# Patient Record
Sex: Male | Born: 1940
Health system: Southern US, Community
[De-identification: ages and names within clinical notes are randomized; demographics above are authoritative.]

## PROBLEM LIST (undated history)

## (undated) DIAGNOSIS — I10 Essential (primary) hypertension: Secondary | ICD-10-CM

## (undated) DIAGNOSIS — E785 Hyperlipidemia, unspecified: Secondary | ICD-10-CM

## (undated) DIAGNOSIS — D649 Anemia, unspecified: Secondary | ICD-10-CM

## (undated) DIAGNOSIS — Z9119 Patient's noncompliance with other medical treatment and regimen: Secondary | ICD-10-CM

## (undated) DIAGNOSIS — M1990 Unspecified osteoarthritis, unspecified site: Secondary | ICD-10-CM

## (undated) DIAGNOSIS — E669 Obesity, unspecified: Secondary | ICD-10-CM

## (undated) DIAGNOSIS — Z91199 Patient's noncompliance with other medical treatment and regimen due to unspecified reason: Secondary | ICD-10-CM

## (undated) DIAGNOSIS — N529 Male erectile dysfunction, unspecified: Secondary | ICD-10-CM

## (undated) DIAGNOSIS — M25559 Pain in unspecified hip: Secondary | ICD-10-CM

## (undated) DIAGNOSIS — R7301 Impaired fasting glucose: Secondary | ICD-10-CM

## (undated) DIAGNOSIS — T7840XA Allergy, unspecified, initial encounter: Secondary | ICD-10-CM

## (undated) DIAGNOSIS — G8929 Other chronic pain: Secondary | ICD-10-CM

## (undated) DIAGNOSIS — K219 Gastro-esophageal reflux disease without esophagitis: Secondary | ICD-10-CM

## (undated) HISTORY — DX: Allergy, unspecified, initial encounter: T78.40XA

## (undated) HISTORY — PX: BILATERAL KNEE ARTHROSCOPY: SUR91

## (undated) HISTORY — DX: Male erectile dysfunction, unspecified: N52.9

## (undated) HISTORY — DX: Anemia, unspecified: D64.9

## (undated) HISTORY — DX: Impaired fasting glucose: R73.01

## (undated) HISTORY — DX: Obesity, unspecified: E66.9

## (undated) HISTORY — PX: HERNIA REPAIR: SHX51

## (undated) HISTORY — PX: OTHER SURGICAL HISTORY: SHX169

## (undated) HISTORY — PX: REFRACTIVE SURGERY: SHX103

## (undated) HISTORY — DX: Essential (primary) hypertension: I10

## (undated) HISTORY — DX: Gastro-esophageal reflux disease without esophagitis: K21.9

## (undated) HISTORY — DX: Patient's noncompliance with other medical treatment and regimen due to unspecified reason: Z91.199

## (undated) HISTORY — DX: Hyperlipidemia, unspecified: E78.5

## (undated) HISTORY — PX: JOINT REPLACEMENT: SHX530

## (undated) HISTORY — DX: Other chronic pain: G89.29

## (undated) HISTORY — DX: Unspecified osteoarthritis, unspecified site: M19.90

## (undated) HISTORY — PX: EYE SURGERY: SHX253

## (undated) HISTORY — DX: Pain in unspecified hip: M25.559

## (undated) HISTORY — DX: Patient's noncompliance with other medical treatment and regimen: Z91.19

---

## 2004-04-18 ENCOUNTER — Ambulatory Visit (HOSPITAL_COMMUNITY): Admission: RE | Admit: 2004-04-18 | Discharge: 2004-04-18 | Payer: Self-pay | Admitting: Family Medicine

## 2004-09-20 ENCOUNTER — Ambulatory Visit: Payer: Self-pay | Admitting: Internal Medicine

## 2004-09-20 ENCOUNTER — Ambulatory Visit (HOSPITAL_COMMUNITY): Admission: RE | Admit: 2004-09-20 | Discharge: 2004-09-20 | Payer: Self-pay | Admitting: Internal Medicine

## 2005-02-19 ENCOUNTER — Ambulatory Visit (HOSPITAL_COMMUNITY): Admission: RE | Admit: 2005-02-19 | Discharge: 2005-02-19 | Payer: Self-pay | Admitting: Family Medicine

## 2005-02-19 ENCOUNTER — Ambulatory Visit: Payer: Self-pay | Admitting: *Deleted

## 2005-06-25 ENCOUNTER — Encounter (INDEPENDENT_AMBULATORY_CARE_PROVIDER_SITE_OTHER): Payer: Self-pay | Admitting: Orthopaedic Surgery

## 2005-06-25 ENCOUNTER — Ambulatory Visit (HOSPITAL_COMMUNITY): Admission: RE | Admit: 2005-06-25 | Discharge: 2005-06-25 | Payer: Self-pay | Admitting: Orthopaedic Surgery

## 2007-03-04 ENCOUNTER — Inpatient Hospital Stay (HOSPITAL_COMMUNITY): Admission: AC | Admit: 2007-03-04 | Discharge: 2007-03-10 | Payer: Self-pay

## 2007-03-08 ENCOUNTER — Ambulatory Visit: Payer: Self-pay | Admitting: Physical Medicine & Rehabilitation

## 2007-03-10 ENCOUNTER — Inpatient Hospital Stay (HOSPITAL_COMMUNITY)
Admission: RE | Admit: 2007-03-10 | Discharge: 2007-03-17 | Payer: Self-pay | Admitting: Physical Medicine & Rehabilitation

## 2007-03-12 ENCOUNTER — Ambulatory Visit: Payer: Self-pay | Admitting: Physical Medicine & Rehabilitation

## 2007-09-23 HISTORY — PX: HIP FRACTURE SURGERY: SHX118

## 2007-10-29 ENCOUNTER — Ambulatory Visit (HOSPITAL_COMMUNITY): Admission: RE | Admit: 2007-10-29 | Discharge: 2007-10-29 | Payer: Self-pay | Admitting: Family Medicine

## 2008-03-29 IMAGING — US US AORTA SCREENING (MEDICARE)
1 series · 14 of 25 positions shown · non-contrast
Comparison: none

HISTORY: Screening for abdominal aortic aneurysm

[Series 1: us aorta screening (medicare) · 0.33mm/px · 14 of 34 slices shown]
[im 1/34]
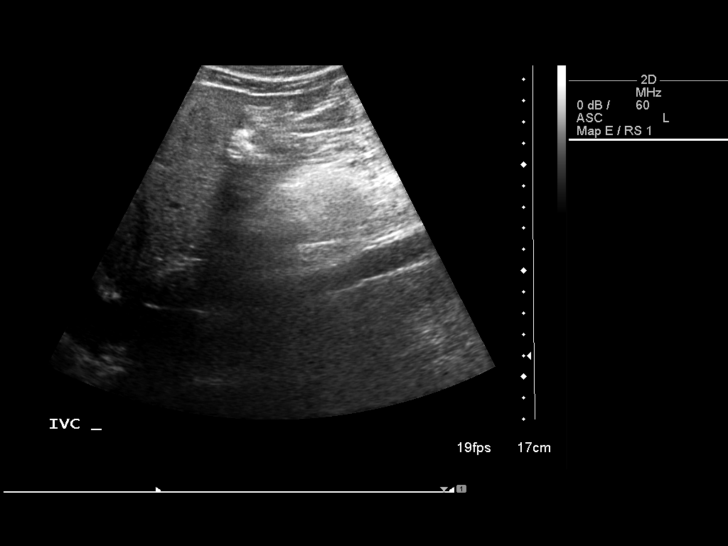
[im 3/34]
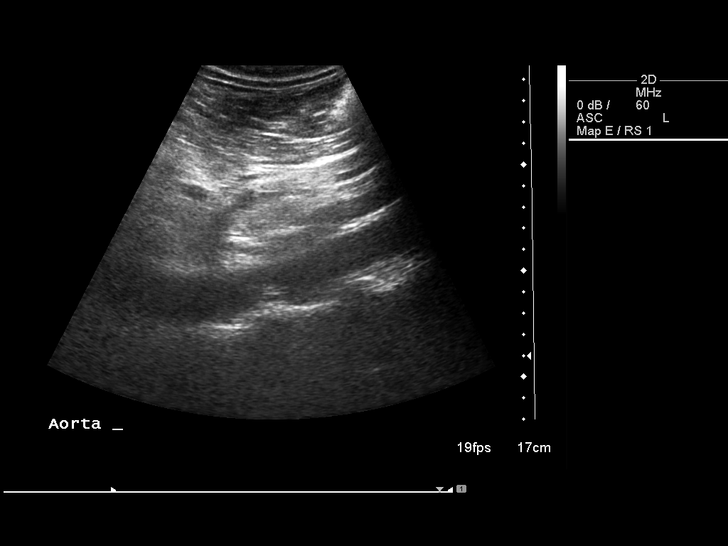
[im 6/34]
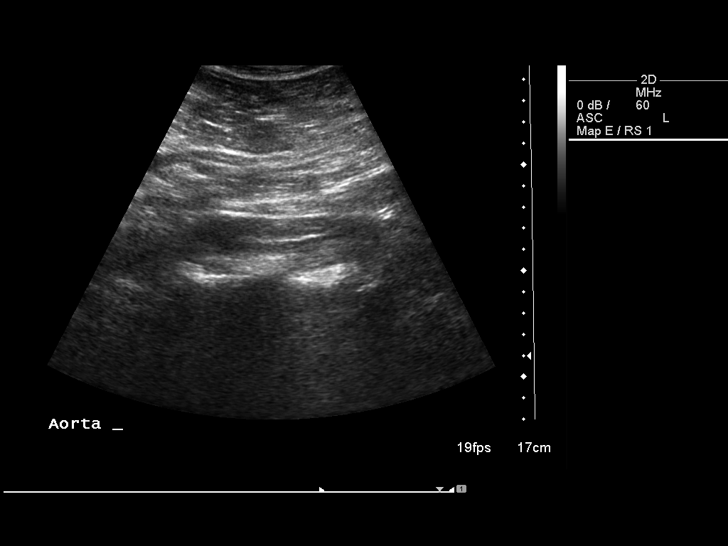
[im 9/34]
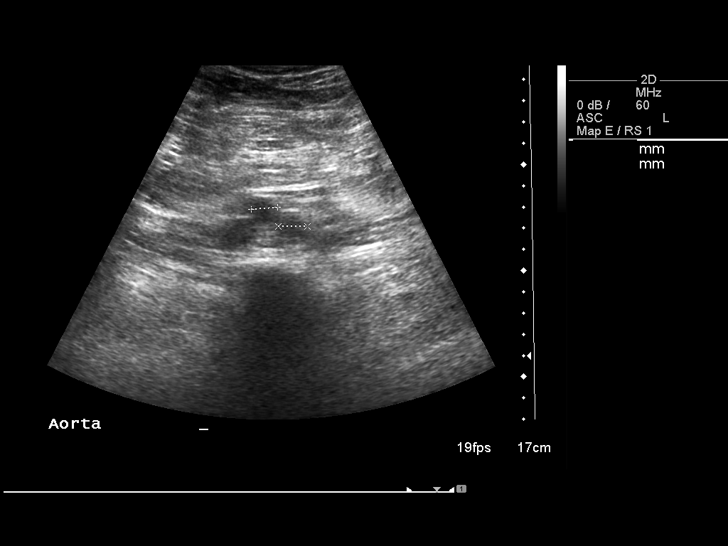
[im 12/34]
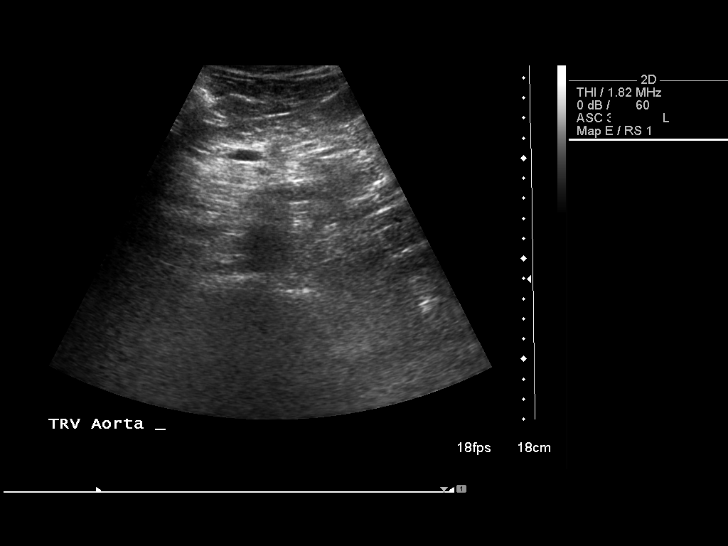
[im 13/34]
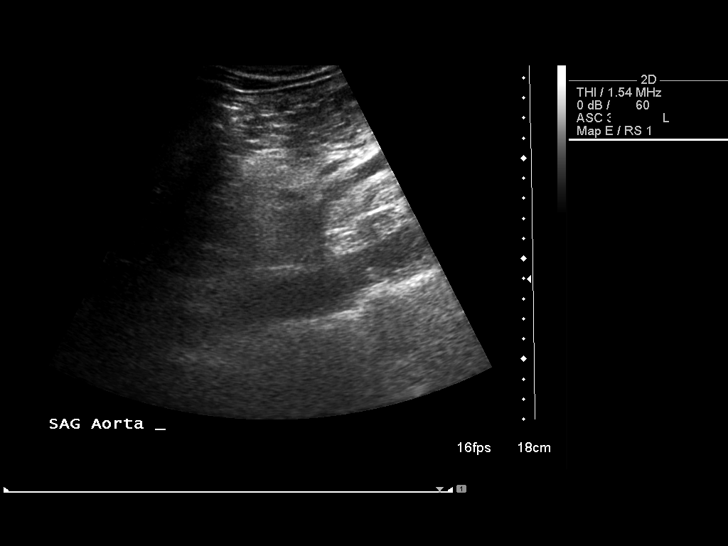
[im 16/34]
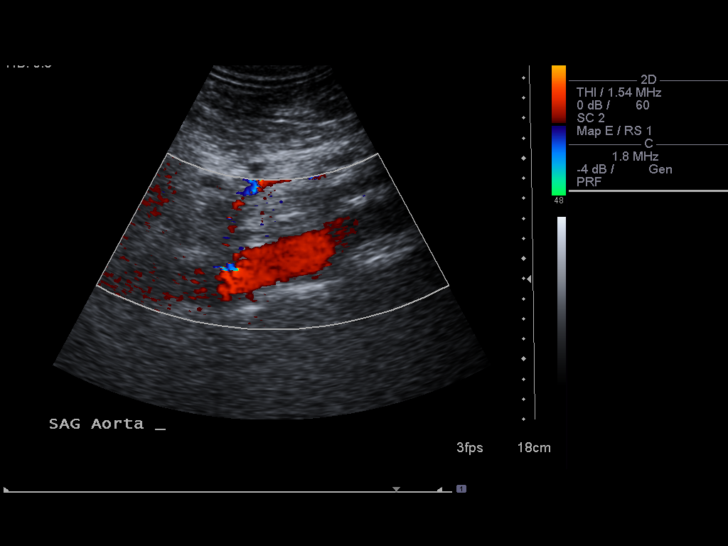
[im 18/34]
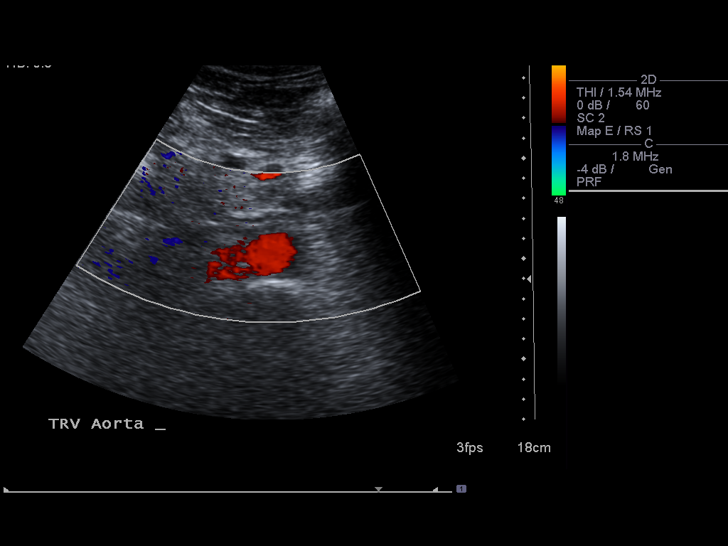
[im 21/34]
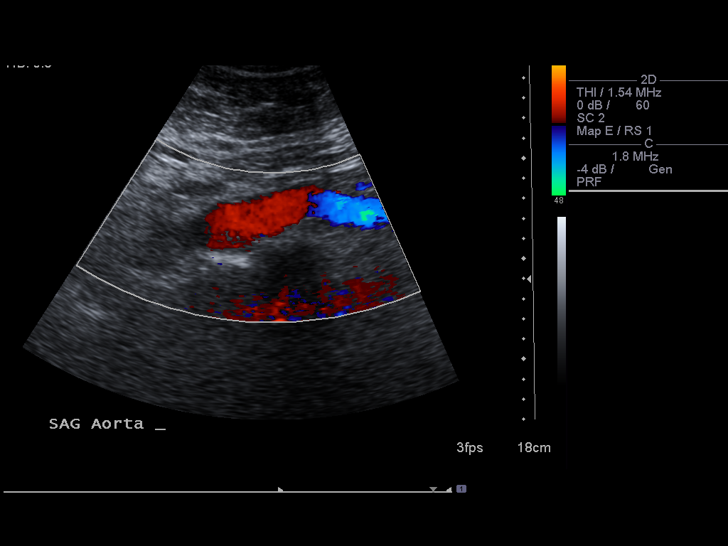
[im 23/34]
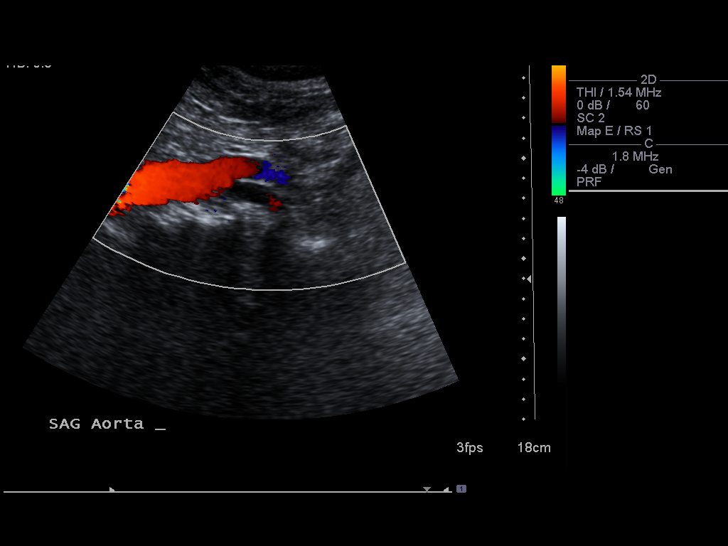
[im 25/34]
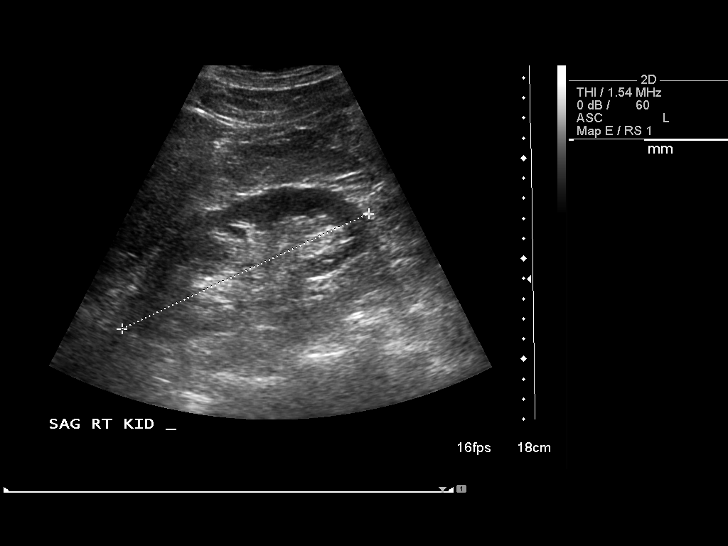
[im 28/34]
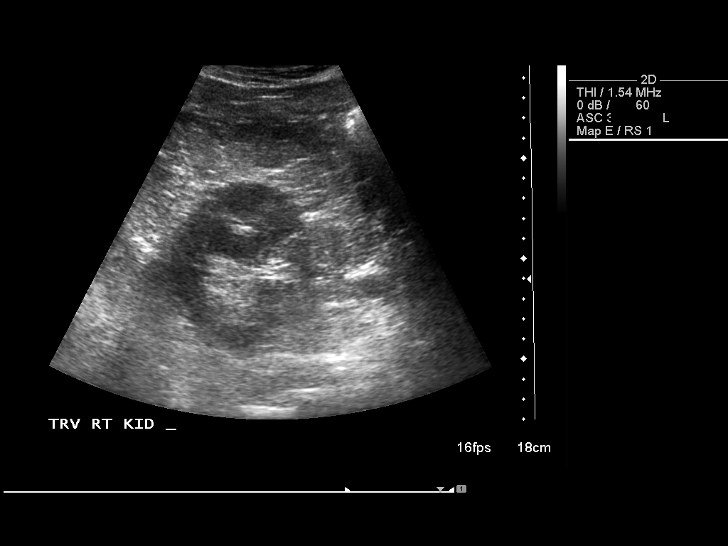
[im 31/34]
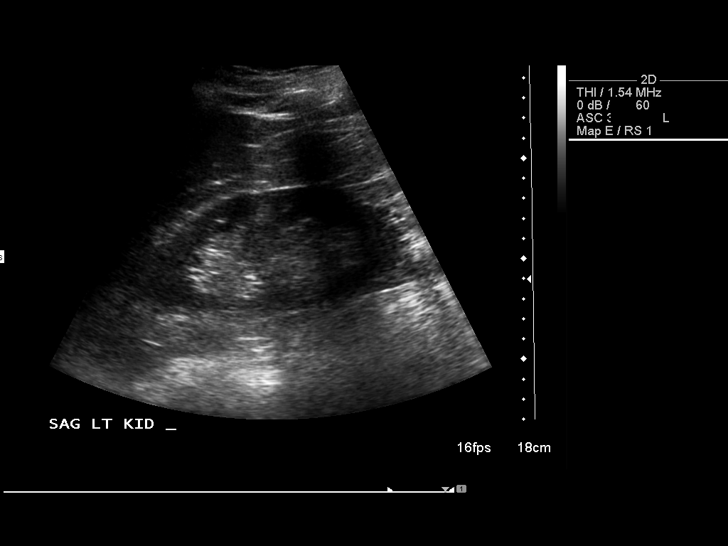
[im 34/34]
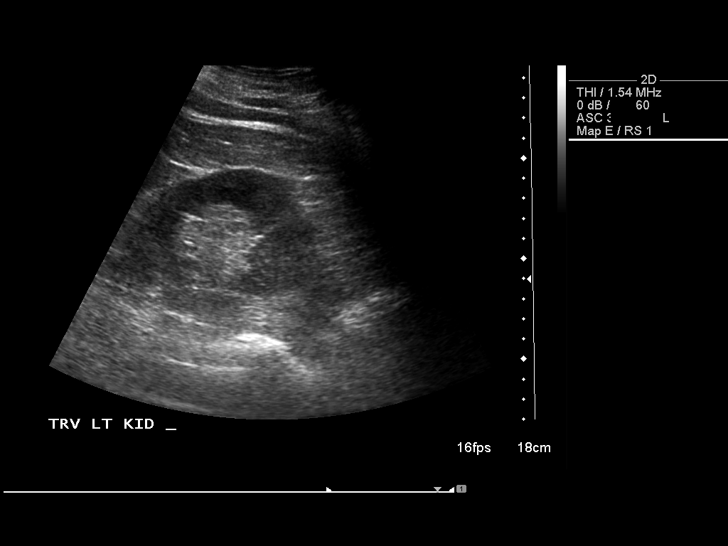

[14 of 25 positions shown; findings below may reference images not displayed]

ULTRASOUND AORTA SCREENING:

Sonography of the abdominal aorta performed.
Survey imaging of the kidneys performed.

Mild atherosclerotic changes in the abdominal aorta identified with aortic
maximal dimensions of 2.8 cm diameter proximally, 2.8 cm diameter at midportion,
and 2.9 cm distally.
No discrete aneurysmal dilatation.
Common iliac arteries minimally prominent at 12 mm diameter right and 14 mm
diameter left.
Kidneys normal in size on survey imaging, 13.6 cm length right and 13.9 cm left.
IMPRESSION: Atherosclerotic changes of abdominal aorta with upper normal caliber distally,
measuring up to 2.9 cm greatest size.
Minimally prominent common iliac artery diameters bilaterally.

## 2008-09-22 HISTORY — PX: CHOLECYSTECTOMY: SHX55

## 2009-10-15 ENCOUNTER — Inpatient Hospital Stay (HOSPITAL_COMMUNITY): Admission: EM | Admit: 2009-10-15 | Discharge: 2009-10-15 | Payer: Self-pay | Admitting: Emergency Medicine

## 2009-10-15 ENCOUNTER — Encounter (INDEPENDENT_AMBULATORY_CARE_PROVIDER_SITE_OTHER): Payer: Self-pay | Admitting: General Surgery

## 2010-10-13 ENCOUNTER — Encounter: Payer: Self-pay | Admitting: Family Medicine

## 2010-12-08 LAB — URINALYSIS, ROUTINE W REFLEX MICROSCOPIC
Bilirubin Urine: NEGATIVE
Glucose, UA: NEGATIVE mg/dL
Protein, ur: NEGATIVE mg/dL
Specific Gravity, Urine: 1.015 (ref 1.005–1.030)
Urobilinogen, UA: 0.2 mg/dL (ref 0.0–1.0)

## 2010-12-08 LAB — COMPREHENSIVE METABOLIC PANEL
ALT: 19 U/L (ref 0–53)
Alkaline Phosphatase: 45 U/L (ref 39–117)
BUN: 13 mg/dL (ref 6–23)
CO2: 26 mEq/L (ref 19–32)
Chloride: 96 mEq/L (ref 96–112)
Glucose, Bld: 125 mg/dL — ABNORMAL HIGH (ref 70–99)
Potassium: 3.6 mEq/L (ref 3.5–5.1)
Total Bilirubin: 0.6 mg/dL (ref 0.3–1.2)

## 2010-12-08 LAB — PROTIME-INR
INR: 1.05 (ref 0.00–1.49)
Prothrombin Time: 13.6 seconds (ref 11.6–15.2)

## 2010-12-08 LAB — DIFFERENTIAL
Basophils Absolute: 0 10*3/uL (ref 0.0–0.1)
Basophils Relative: 1 % (ref 0–1)
Eosinophils Absolute: 0.3 10*3/uL (ref 0.0–0.7)
Neutro Abs: 5.1 10*3/uL (ref 1.7–7.7)
Neutrophils Relative %: 63 % (ref 43–77)

## 2010-12-08 LAB — POCT CARDIAC MARKERS: Myoglobin, poc: 44.9 ng/mL (ref 12–200)

## 2010-12-08 LAB — CBC
HCT: 47.1 % (ref 39.0–52.0)
Hemoglobin: 15.9 g/dL (ref 13.0–17.0)
RBC: 5.25 MIL/uL (ref 4.22–5.81)
WBC: 8.1 10*3/uL (ref 4.0–10.5)

## 2010-12-08 LAB — URINE MICROSCOPIC-ADD ON

## 2010-12-08 LAB — AMYLASE: Amylase: 44 U/L (ref 0–105)
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
## 2011-02-07 NOTE — Op Note (Signed)
NAME:  Hector [name], Hector [name]              ACCOUNT NO.:  [id number]   MEDICAL RECORD NO.:  [id number]          PATIENT TYPE:  AMB   LOCATION:  DAY                           FACILITY:  [facility]   PHYSICIAN:  J. Darreld Mclean, M.D. DATE OF BIRTH:  28-May-1941   DATE OF PROCEDURE:  06/25/2005  DATE OF DISCHARGE:                                 OPERATIVE REPORT   PREOPERATIVE DIAGNOSIS:  Tear medial meniscus, left knee.   POSTOPERATIVE DIAGNOSIS:  Tear medial meniscus, left knee, plus partial tear  of lateral meniscus and loose body.   PROCEDURE:  Operative arthroscopy of the left knee, partial medial and  lateral meniscectomy, removal of loose body.   TOURNIQUET TIME:  27 minutes.   DRAINS:  No drains.   SURGEON:  Dr. Hilda Lias.   INDICATIONS:  The patient is a 70 year old male with pain and tenderness in  his left knee. MRI shows a tear of the left knee medial meniscus. It has not  improved with conservative treatments. Surgery is recommended. Risks and  imponderables of the procedure were discussed preoperatively. The patient  appeared to understand and agreed with the procedure.   OPERATIVE FINDINGS:  Suprapatellar pouch had some mild degenerative changes.  The undersurface of the patella had some grade 3 to 4 changes, more  laterally. There is a loose body around the patella area which was removed.  Medially, there was fraying and tearing of the meniscus more anteriorly and  some in the far posterior horn. Had a large defect on the femoral condyle, a  grade 3 to early 4 changes. Anterior cruciate had some slight fraying but  otherwise intact. Medically, there was diffuse fraying of the lateral  meniscal tissue and posterior horn but no significant tear except for one  area. Cartilage looked grade 2 changes laterally.   DESCRIPTION OF PROCEDURE:  The patient was seen in the holding area. The  left knee was identified as the correct surgical site. We identified the  patient. He placed a  mark on the left knee, and I placed a mark on the left  knee. I talked to his wife. The patient was brought back to the operating  room. He was placed supine and given general anesthetic. Leg holder placed  deflated left upper thigh and was placed into the leg holder. The patient  prepped and draped in the usual manner. Had a time out and reidentified the  patient and doing the left knee. The leg was elevated and wrapped  circumferentially with Esmarch bandage. Tourniquet inflated to 350 mmHg.  Esmarch bandage removed. Inflow cannula inserted medially. Lactated ringers  instilled in the knee by an infusion pump. Arthroscope was inserted  laterally. Knee systematically examined. Please see findings above. Using a  meniscal shaver and a laser and a punch on the tear of the meniscus  anteriorly and then the posterior horn tear was removed. Good smooth contour  was obtained. Laterally, he had significant fraying, and I used a meniscal  shaver in the anterior portion and the posterior portion with a small tear  used a laser, and I smoothed  that over very nicely. He had a loose body up  around the patellar area, and this was found and removed. Knee was  systematically reexamined, and no new pathology was found. Permanent  pictures had been taken during the procedure. Wounds were then  reapproximated using 3-0 nylon interrupted vertical mattress manner.  Marcaine 0.25% instilled in each portal. Tourniquet deflated after 27  minutes. Sterile dressing applied and bulky dressing applied. The patient  tolerated the procedure well and will go to recovery in good condition.  Prescription for Vicodin ES given for pain. If he has any difficulties or  problems, he is to call me or contact through the office or the hospital  beeper system.           ______________________________  Shela Commons. Darreld Mclean, M.D.     JWK/MEDQ  D:  06/25/2005  T:  06/25/2005  Job:  [id number]

## 2011-02-07 NOTE — H&P (Signed)
NAME:  Hector [name], Hector [name]              ACCOUNT NO.:  [id number]   MEDICAL RECORD NO.:  [id number]          PATIENT TYPE:  AMB   LOCATION:  DAY                           FACILITY:  [facility]   PHYSICIAN:  J. Darreld Mclean, M.D. DATE OF BIRTH:  Apr 19, 1941   DATE OF ADMISSION:  06/25/2005  DATE OF DISCHARGE:  LH                                HISTORY & PHYSICAL   CHIEF COMPLAINT:  My knee hurts on the left.   HISTORY OF PRESENT ILLNESS:  The patient complains of pain and tenderness in  the left knee for several months.  It has been getting progressively worse.  I saw him in the office on May 13, 2005.  At that time, I felt he may  have a medial meniscal injury.  I injected his knee with Depo-Medrol 40 mg.  I recommended an MRI.  He got an MRI which showed some degenerative fraying  of the lateral meniscus, complex medial meniscus tear, with tricompartmental  osteoarthritis.  I informed him of the findings on May 20, 2005.  He says  the injection helped, and he just did not want any surgery at that time.  However, the pain has reoccurred.  He is having giving way of the knee again  and pain and tenderness.  He would like to proceed with surgery of the left  knee.  The risks and imponderables of the procedure were discussed.  He  understands that this is an outpatient procedure.   PAST MEDICAL HISTORY:  The patient has a history of hypertension, but  otherwise has a negative past history.   ALLERGIES:  THE PATIENT HAS NO ALLERGIES.   CURRENT MEDICATIONS:  1.  Lotrel 20 mg daily.  2.  Tricor daily.   SOCIAL HISTORY:  He does not smoke.  He does not use alcoholic beverages.  He is a Engineer, agricultural.  He is divorced.  He lives in [place].   PRIMARY CARE PHYSICIAN:  Donna Bernard, M.D.   PAST SURGICAL HISTORY:  1.  He is status post left knee surgery, meniscectomy, in 1968; right knee      surgery in 1974.  2.  Hernia repair in 2000.   FAMILY HISTORY:  He denies any  diseases that run in the family.   PHYSICAL EXAMINATION:  VITAL SIGNS:  Blood pressure 180/98, pulse 64,  respirations 20, afebrile, height 5 feet 11 inches, weight 250 pounds.  GENERAL:  He is alert, cooperative, and oriented.  HEENT:  Negative.  NECK:  Supple.  LUNGS:  Clear to P&A.  HEART:  Regular without murmur heard.  ABDOMEN:  Soft, nontender, without masses.  EXTREMITIES:  Left knee has swelling and tenderness.  Positive McMurray  medially.  Other extremities are negative.  CNS:  Intact.  SKIN:  Intact.   IMPRESSION:  1.  Tear of the medial meniscus, left knee; degenerative joint disease of      the left knee.  2.  History of hypertension.   PLAN:  Operative arthroscopy of the left knee as an outpatient.  Discussed  the procedure, plan, and the risks  and imponderables.  He appears to  understand.  Labs are pending.                                            ______________________________  J. Darreld Mclean, M.D.     JWK/MEDQ  D:  06/24/2005  T:  06/24/2005  Job:  [id number]

## 2011-02-07 NOTE — Procedures (Signed)
NAMEMICKEAL, Hector [name]              ACCOUNT NO.:  [id number]   MEDICAL RECORD NO.:  [id number]          PATIENT TYPE:  OUT   LOCATION:  RAD                           FACILITY:  [facility]   PHYSICIAN:  Vida Roller, M.D.   DATE OF BIRTH:  07/09/1941   DATE OF PROCEDURE:  02/19/2005  DATE OF DISCHARGE:                                  ECHOCARDIOGRAM   PRIMARY:  Dr. Lubertha South.   Tape number [id number]. Tape count 1130 through 1571.   This is a 70 year old male with an abnormal electrocardiogram. Technical  quality of this study is somewhat limited.   M-MODE TRACINGS:  The aorta is 33 mm.   Left atrium is 43 mm.   The septum is 17 mm.   Posterior wall is 15 mm.   Left ventricular diastolic dimension is 38 mm.   Left ventricular systolic dimension is 22 mm.   TWO-D AND DOPPLER IMAGING:  The left ventricle is normal with vigorous left  ventricular systolic function. Estimated ejection fraction is greater than  75%. There were no obvious wall motion abnormalities. Specifically, the  inferior wall appears to be completely normal. There is mild to moderate  concentric left ventricular hypertrophy.   The right ventricle appears to be top normal in size. There is mild  thickening of the ventricular free wall consistent with right ventricular  hypertrophy.   Both atria are enlarged.   The aortic valve is not well seen, but it does not appear to have any  significant stenosis or regurgitation.   The mitral valve was also not particularly well seen. There is no  significant stenosis or regurgitation.   Tricuspid valve not well seen.   Pulmonic valve not well seen.   Ascending aorta not well seen.   Inferior vena cava not well seen.   There is no obvious pericardial effusion.      JH/MEDQ  D:  02/19/2005  T:  02/19/2005  Job:  [id number]
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
## 2011-02-07 NOTE — Op Note (Signed)
NAME:  Hector [name], Hector [name]              ACCOUNT NO.:  [id number]   MEDICAL RECORD NO.:  [id number]          PATIENT TYPE:  AMB   LOCATION:  DAY                           FACILITY:  [facility]   PHYSICIAN:  R. Roetta Sessions, M.D. DATE OF BIRTH:  [birth year]   DATE OF PROCEDURE:  09/20/2004  DATE OF DISCHARGE:                                 OPERATIVE REPORT   PROCEDURE:  Screening colonoscopy.   ENDOSCOPIST:  Gerrit Friends. Rourk, M.D.   INDICATIONS FOR PROCEDURE:  The patient is a 70 year old gentleman who was  referred at the courtesy of Dr. Lacretia Nicks. Simone Curia for colorectal cancer  screening.  He is devoid of any lower GI tract symptoms.  Sigmoidoscopy in  2001 demonstrated only left-sided diverticula.  There is no family history  of colorectal neoplasia.  Colonoscopy is now.  This approach has been  discussed with the patient at length.  The potential risks, benefits, and  alternatives have been reviewed; questions answered.  Please see the  documentation in the medical record.   PROCEDURE NOTE:  O2 saturation, blood pressure, pulse and respirations were  monitored throughout the entire procedure.  Conscious sedation: Versed 4 mg IV, Demerol 75 mg IV in divided doses.   INSTRUMENT:  Olympus video chip system.   FINDINGS:  A digital rectal exam revealed no abnormalities.   ENDOSCOPIC FINDINGS:  The prep was good.   RECTUM:  Examination of the rectal mucosa including the retroflex view of  the anal verge revealed a single prominent anal papilla.  Otherwise rectal  mucosa appeared normal.   COLON:  The colonic mucosa was surveyed from the rectosigmoid junction  through the left transverse and right colon to the area of the appendiceal  orifice, ileocecal valve, and cecum.  These structures were well seen and  photographed for the record.   From this level the scope was slowly and cautiously withdrawn.  All  previously mentioned mucosal surfaces were again seen.  The patient was  noted to  have numerous sigmoid diverticula.  The remainder of the colonic  mucosa appeared normal.  The patient tolerated the procedure well and was  reacted in endoscopy.   IMPRESSION:  1.  A single prominent anal papilla otherwise normal rectum.  2.  Left-sided diverticula.  3.  The remainder of the colonic mucosa appeared normal.   RECOMMENDATIONS:  1.  Diverticulosis literature provided to the patient.  2.  Repeat screening colonoscopy in 10 years.     Otelia Sergeant   RMR/MEDQ  D:  09/20/2004  T:  09/20/2004  Job:  [id number]   cc:   Donna Bernard, M.D.  [address]  [place]  [place] [address]  Fax: [phone]   R. Roetta Sessions, M.D.  Fax: [phone]

## 2011-07-09 LAB — CBC
HCT: 34.2 — ABNORMAL LOW
Hemoglobin: 11.7 — ABNORMAL LOW
MCHC: 33.6
MCHC: 34.4
MCV: 88.9
MCV: 88.6
Platelets: 115 — ABNORMAL LOW
Platelets: 139 — ABNORMAL LOW
Platelets: 277
RBC: 3.41 — ABNORMAL LOW
RDW: 12.7
RDW: 13.2
RDW: 13.3

## 2011-07-09 LAB — DIFFERENTIAL
Basophils Absolute: 0.1
Basophils Relative: 1
Lymphocytes Relative: 18
Monocytes Relative: 14 — ABNORMAL HIGH
Neutro Abs: 5.2
Neutrophils Relative %: 63

## 2011-07-09 LAB — PROTIME-INR
INR: 1.8 — ABNORMAL HIGH
INR: 2 — ABNORMAL HIGH
INR: 2.2 — ABNORMAL HIGH
INR: 2.5 — ABNORMAL HIGH
INR: 2.6 — ABNORMAL HIGH
Prothrombin Time: 22.1 — ABNORMAL HIGH
Prothrombin Time: 24 — ABNORMAL HIGH
Prothrombin Time: 25.3 — ABNORMAL HIGH
Prothrombin Time: 29.2 — ABNORMAL HIGH

## 2011-07-09 LAB — COMPREHENSIVE METABOLIC PANEL
Alkaline Phosphatase: 40
BUN: 14
Creatinine, Ser: 1.03
Glucose, Bld: 99
Potassium: 3.9
Total Bilirubin: 0.6
Total Protein: 5.7 — ABNORMAL LOW

## 2011-07-09 LAB — BASIC METABOLIC PANEL
BUN: 16
CO2: 27
Calcium: 7.9 — ABNORMAL LOW
Chloride: 100
Creatinine, Ser: 1.32
GFR calc Af Amer: >60

## 2011-07-10 LAB — SAMPLE TO BLOOD BANK

## 2011-07-10 LAB — POCT I-STAT 7, (LYTES, BLD GAS, ICA,H+H)
Calcium, Ion: 1.15
HCT: 36 — ABNORMAL LOW
Hemoglobin: 10.5 — ABNORMAL LOW
Hemoglobin: 12.2 — ABNORMAL LOW
O2 Saturation: 93
Operator id: 119851
Potassium: 3.8
Potassium: 4.2
Potassium: 4.2
Sodium: 137
TCO2: 23
TCO2: 26
pCO2 arterial: 46.9 — ABNORMAL HIGH
pCO2 arterial: 58.9
pH, Arterial: 7.302 — ABNORMAL LOW
pH, Arterial: 7.334 — ABNORMAL LOW
pO2, Arterial: 81

## 2011-07-10 LAB — BASIC METABOLIC PANEL
BUN: 14
CO2: 28
Calcium: 7.7 — ABNORMAL LOW
Calcium: 8.8
Creatinine, Ser: 1.21
Creatinine, Ser: 1.28
GFR calc Af Amer: >60
GFR calc Af Amer: >60
GFR calc non Af Amer: >60

## 2011-07-10 LAB — POCT I-STAT 4, (NA,K, GLUC, HGB,HCT)
Glucose, Bld: 113 — ABNORMAL HIGH
HCT: 27 — ABNORMAL LOW
Operator id: 114421
Operator id: 119851
Potassium: 3.9
Sodium: 137

## 2011-07-10 LAB — I-STAT 8, (EC8 V) (CONVERTED LAB)
Acid-base deficit: 1
Bicarbonate: 23
Chloride: 107
HCT: 44
Hemoglobin: 15
Operator id: 146091
Sodium: 138
pCO2, Ven: 34.9 — ABNORMAL LOW

## 2011-07-10 LAB — DIFFERENTIAL
Basophils Absolute: 0
Basophils Relative: 0
Eosinophils Absolute: 0.2
Eosinophils Relative: 2
Neutrophils Relative %: 69

## 2011-07-10 LAB — CBC
HCT: 41.8
MCHC: 34
MCV: 89.9
Platelets: 128 — ABNORMAL LOW
Platelets: 211
RBC: 4.28
RDW: 13.1
RDW: 13.2
WBC: 10.7 — ABNORMAL HIGH

## 2011-07-10 LAB — CROSSMATCH: ABO/RH(D): A POS

## 2011-07-10 LAB — ETHANOL: Alcohol, Ethyl (B): <5

## 2011-07-10 LAB — PROTIME-INR
INR: 1.1
INR: 1.4
Prothrombin Time: 14.8

## 2011-07-10 LAB — POCT I-STAT CREATININE: Creatinine, Ser: 1.4

## 2011-08-14 ENCOUNTER — Emergency Department (HOSPITAL_COMMUNITY)
Admission: EM | Admit: 2011-08-14 | Discharge: 2011-08-14 | Disposition: A | Discharge status: Home / Self Care | Payer: Medicare HMO | Attending: Emergency Medicine | Admitting: Emergency Medicine

## 2011-08-14 DIAGNOSIS — K089 Disorder of teeth and supporting structures, unspecified: Secondary | ICD-10-CM | POA: Insufficient documentation

## 2011-08-14 DIAGNOSIS — I10 Essential (primary) hypertension: Secondary | ICD-10-CM | POA: Insufficient documentation

## 2011-08-14 DIAGNOSIS — K029 Dental caries, unspecified: Secondary | ICD-10-CM | POA: Insufficient documentation

## 2011-08-14 DIAGNOSIS — K0889 Other specified disorders of teeth and supporting structures: Secondary | ICD-10-CM

## 2011-08-14 DIAGNOSIS — K0381 Cracked tooth: Secondary | ICD-10-CM | POA: Insufficient documentation

## 2011-08-14 MED ORDER — OXYCODONE-ACETAMINOPHEN 5-325 MG PO TABS
ORAL_TABLET | ORAL | Status: AC
  Filled 2011-08-14: qty 3

## 2011-08-14 MED ORDER — OXYCODONE-ACETAMINOPHEN 5-325 MG PO TABS: 1.0000 | ORAL_TABLET | ORAL | Status: AC | PRN

## 2011-08-14 NOTE — Progress Notes (Signed)
70 year old male with dental pain. He is currently on amoxicillin because of a problem with tooth #10. He is scheduled to have a root canal done in 4 days, but is having problems because of pain. Acetaminophen and ibuprofen have not been giving him relief of pain. On exam, tooth #10 appears cracked but there is no gross abscess. No obvious swelling of the gingiva. He will need analgesics for pain control until he can see his dentist as scheduled.

## 2011-08-14 NOTE — ED Provider Notes (Signed)
History     CSN: [id number] Arrival date & time: 08/14/2011  6:37 PM   First MD Initiated Contact with Patient 08/14/11 1833      Chief Complaint  Patient presents with  . Dental Pain    (Consider location/radiation/quality/duration/timing/severity/associated sxs/prior treatment) HPI Comments: Patient c/o dental pain and fracture of the upper left lateral incisor.  Currently taking amoxil for infection and has appt with dentist for Monday.  He denies fever or other symptoms  Patient is a 70 y.o. male presenting with tooth pain. The history is provided by the patient.  Dental PainThe primary symptoms include mouth pain. Primary symptoms do not include oral bleeding, oral lesions, headaches, fever, shortness of breath, sore throat or angioedema. The symptoms began 3 to 5 days ago. The symptoms are worsening. The symptoms are new. The symptoms occur constantly.  Mouth pain began 3 - 5 days ago. Mouth pain is worsening. Affected locations include: teeth.  Additional symptoms include: dental sensitivity to temperature. Additional symptoms do not include: gum swelling, gum tenderness, purulent gums, trismus, jaw pain, facial swelling, trouble swallowing, ear pain, swollen glands and fatigue. Medical issues include: periodontal disease.    History reviewed. No pertinent past medical history.  Past Surgical History  Procedure Date  . Hip fracture surgery   . Knee arthroplasty     No family history on file.  History  Substance Use Topics  . Smoking status: Never Smoker   . Smokeless tobacco: Not on file  . Alcohol Use: No      Review of Systems  Constitutional: Negative for fever, chills and fatigue.  HENT: Positive for dental problem. Negative for ear pain, congestion, sore throat, facial swelling, trouble swallowing, neck pain and neck stiffness.   Respiratory: Negative for chest tightness and shortness of breath.   Cardiovascular: Negative for chest pain and palpitations.    Gastrointestinal: Negative for nausea, vomiting and abdominal pain.  Skin: Negative for rash.  Neurological: Negative for dizziness, facial asymmetry, weakness, numbness and headaches.  Hematological: Does not bruise/bleed easily.  All other systems reviewed and are negative.    Allergies  Review of patient's allergies indicates no known allergies.  Home Medications   Current Outpatient Rx  Name Route Sig Dispense Refill  . AMOXICILLIN 500 MG PO CAPS Oral Take 500 mg by mouth 3 (three) times daily.      . ATENOLOL 25 MG PO TABS Oral Take 25 mg by mouth daily.      Marland Kitchen FISH OIL BURP-LESS 1200 MG PO CAPS Oral Take 3 capsules by mouth daily.        BP 189/91  Pulse 51  Temp(Src) 98.4 F (36.9 C) (Oral)  Resp 16  Ht 5\' 11"  (1.803 m)  Wt 245 lb (111.131 kg)  BMI 34.17 kg/m2  SpO2 98%  Physical Exam  Nursing note and vitals reviewed. Constitutional: He is oriented to person, place, and time. He appears well-developed and well-nourished. No distress.       Appears uncomfortable.  HENT:  Head: Normocephalic and atraumatic. No trismus in the jaw.  Mouth/Throat: Uvula is midline, oropharynx is clear and moist and mucous membranes are normal. He does not have dentures. No oral lesions. Dental caries present. No dental abscesses, uvula swelling or lacerations.    Neck: Normal range of motion. Neck supple.  Cardiovascular: Normal rate, regular rhythm and normal heart sounds.   Pulmonary/Chest: Effort normal and breath sounds normal. No respiratory distress. He exhibits no tenderness.  Musculoskeletal: Normal range  of motion. He exhibits no tenderness.  Lymphadenopathy:    He has no cervical adenopathy.  Neurological: He is alert and oriented to person, place, and time. No cranial nerve deficit. He exhibits normal muscle tone. Coordination normal.  Skin: Skin is warm and dry.    ED Course  Procedures (including critical care time)       MDM     7:00 PM patient has a  fracture of the upper left lateral incisor.  No facial edema, no trismus.  Pt has hx of HTN that's usually controlled with medication but elevated tonight which is felt to be related to his level of pain.  He denies any CP, SOB, diaphoresis, numbness, or headaches.  Currently taking amoxil for the dental pain and has appt with dentist on Monday   Pt feels improved after observation and/or treatment in ED.   Patient / Family / Caregiver understand and agree with initial ED impression and plan with expectations set for ED visit.   Oluwaseun Bruyere L. [place], [place] 08/15/11 [phone]

## 2011-08-14 NOTE — ED Notes (Signed)
Pt presents with left upper dental pain. Pt currently on Amoxicillin for same. Pt has appointment on Monday for dentist.

## 2011-08-16 NOTE — ED Provider Notes (Signed)
I have personally performed and participated in all the services and procedures documented herein. I have reviewed the findings with the patient.   Hector Booze, MD 08/16/11 [phone]

## 2013-01-05 ENCOUNTER — Telehealth: Payer: Self-pay | Admitting: Family Medicine

## 2013-01-05 NOTE — Telephone Encounter (Signed)
I signed other. Ok on the one mo one, too

## 2013-01-05 NOTE — Telephone Encounter (Signed)
Please see attached documents on chart as well, Pt is out of his Amlodipine 10 mg and would like one month called into [facility] and his next 3 refills with [facility] please an thank you

## 2013-01-06 NOTE — Telephone Encounter (Signed)
30 day supply of amlodipine 10 mg called into [facility] and form faxed to [facility] for amlodipine #90 with 3 refills.

## 2013-01-07 ENCOUNTER — Encounter: Payer: Self-pay | Admitting: *Deleted

## 2013-01-18 ENCOUNTER — Encounter: Payer: Self-pay | Admitting: Family Medicine

## 2013-01-18 ENCOUNTER — Ambulatory Visit (INDEPENDENT_AMBULATORY_CARE_PROVIDER_SITE_OTHER): Payer: Medicare HMO | Admitting: Family Medicine

## 2013-01-18 VITALS — BP 130/82 | Ht 70.5 in | Wt 250.6 lb

## 2013-01-18 DIAGNOSIS — G629 Polyneuropathy, unspecified: Secondary | ICD-10-CM

## 2013-01-18 DIAGNOSIS — E785 Hyperlipidemia, unspecified: Secondary | ICD-10-CM

## 2013-01-18 DIAGNOSIS — N529 Male erectile dysfunction, unspecified: Secondary | ICD-10-CM | POA: Insufficient documentation

## 2013-01-18 DIAGNOSIS — Z125 Encounter for screening for malignant neoplasm of prostate: Secondary | ICD-10-CM

## 2013-01-18 DIAGNOSIS — Z79899 Other long term (current) drug therapy: Secondary | ICD-10-CM

## 2013-01-18 DIAGNOSIS — G609 Hereditary and idiopathic neuropathy, unspecified: Secondary | ICD-10-CM

## 2013-01-18 DIAGNOSIS — R7301 Impaired fasting glucose: Secondary | ICD-10-CM | POA: Insufficient documentation

## 2013-01-18 DIAGNOSIS — I10 Essential (primary) hypertension: Secondary | ICD-10-CM | POA: Insufficient documentation

## 2013-01-18 NOTE — Progress Notes (Signed)
  Subjective:    Patient ID: Hector [name], male    DOB: Feb 15, 1941, 72 y.o.   MRN: [id number]  HPI Patient arrives office for wellness exam. States overall doing pretty good. Occasional slight numbness in toes. He did not go on to do nerve conduction studies nor blood work as recommended before. See prior note. Not exercising much these days. Still dealing with some chronic left hip pain.   Review of Systems  Constitutional: Negative for fever, activity change and appetite change.  HENT: Negative for congestion, rhinorrhea and neck pain.   Eyes: Negative for discharge.  Respiratory: Negative for cough and wheezing.   Cardiovascular: Negative for chest pain.  Gastrointestinal: Negative for vomiting, abdominal pain and blood in stool.  Genitourinary: Negative for frequency and difficulty urinating.  Skin: Negative for rash.  Allergic/Immunologic: Negative for environmental allergies and food allergies.  Neurological: Negative for weakness and headaches.  Psychiatric/Behavioral: Negative for agitation.      review systems otherwise negative. Objective:   Physical Exam  Vitals reviewed. Constitutional: He appears well-developed and well-nourished.  Obesity noted and discuss  HENT:  Head: Normocephalic and atraumatic.  Right Ear: External ear normal.  Left Ear: External ear normal.  Nose: Nose normal.  Mouth/Throat: Oropharynx is clear and moist.  Eyes: EOM are normal. Pupils are equal, round, and reactive to light.  Neck: Normal range of motion. Neck supple. No thyromegaly present.  Cardiovascular: Normal rate, regular rhythm and normal heart sounds.   No murmur heard. Pulmonary/Chest: Effort normal and breath sounds normal. No respiratory distress. He has no wheezes.  Abdominal: Soft. Bowel sounds are normal. He exhibits no distension and no mass. There is no tenderness.  Genitourinary: Penis normal.  Musculoskeletal: Normal range of motion. He exhibits no edema.   Lymphadenopathy:    He has no cervical adenopathy.  Neurological: He is alert. He exhibits normal muscle tone.  Distal sensation diminished in toes  Skin: Skin is warm and dry. No erythema.  Psychiatric: He has a normal mood and affect. His behavior is normal. Judgment normal.          Assessment & Plan:  #1 wellness exam. #2 sensory neuropathy discussed. #3 colonoscopy due next year. Plan appropriate blood work. Hemoccult cards. Diet exercise discussed in encourage. WSL

## 2013-01-18 NOTE — Patient Instructions (Signed)
Work harder on exercise and diet.

## 2013-01-24 LAB — HEPATIC FUNCTION PANEL
ALT: 25 U/L (ref 0–53)
AST: 22 U/L (ref 0–37)
Albumin: 4.2 g/dL (ref 3.5–5.2)
Alkaline Phosphatase: 46 U/L (ref 39–117)
Bilirubin, Direct: 0.1 mg/dL (ref 0.0–0.3)
Indirect Bilirubin: 0.4 mg/dL (ref 0.0–0.9)

## 2013-01-24 LAB — LIPID PANEL
Cholesterol: 138 mg/dL (ref 0–200)
LDL Cholesterol: 75 mg/dL (ref 0–99)
Triglycerides: 155 mg/dL — ABNORMAL HIGH (ref <150)

## 2013-01-24 LAB — BASIC METABOLIC PANEL
CO2: 24 mEq/L (ref 19–32)
Calcium: 9.7 mg/dL (ref 8.4–10.5)
Potassium: 4.8 mEq/L (ref 3.5–5.3)
Sodium: 139 mEq/L (ref 135–145)

## 2013-01-24 LAB — PSA, MEDICARE: PSA: 1.75 ng/mL (ref <=4.00)

## 2013-01-24 LAB — VITAMIN B12: Vitamin B-12: 379 pg/mL (ref 211–911)

## 2013-01-24 LAB — TSH: TSH: 1.429 u[IU]/mL (ref 0.350–4.500)

## 2013-01-25 ENCOUNTER — Encounter: Payer: Self-pay | Admitting: Family Medicine

## 2013-04-19 ENCOUNTER — Other Ambulatory Visit: Payer: Self-pay | Admitting: Family Medicine

## 2013-04-22 ENCOUNTER — Other Ambulatory Visit (INDEPENDENT_AMBULATORY_CARE_PROVIDER_SITE_OTHER): Payer: Medicare HMO | Admitting: *Deleted

## 2013-04-22 DIAGNOSIS — Z0000 Encounter for general adult medical examination without abnormal findings: Secondary | ICD-10-CM

## 2013-04-22 LAB — POC HEMOCCULT BLD/STL (HOME/3-CARD/SCREEN): Card #2 Fecal Occult Blod, POC: NEGATIVE

## 2013-08-31 ENCOUNTER — Other Ambulatory Visit: Payer: Self-pay | Admitting: Family Medicine

## 2013-11-01 ENCOUNTER — Other Ambulatory Visit: Payer: Self-pay | Admitting: Family Medicine

## 2014-01-09 ENCOUNTER — Telehealth: Payer: Self-pay | Admitting: Family Medicine

## 2014-01-09 DIAGNOSIS — Z79899 Other long term (current) drug therapy: Secondary | ICD-10-CM

## 2014-01-09 DIAGNOSIS — E782 Mixed hyperlipidemia: Secondary | ICD-10-CM

## 2014-01-09 DIAGNOSIS — Z125 Encounter for screening for malignant neoplasm of prostate: Secondary | ICD-10-CM

## 2014-01-09 NOTE — Telephone Encounter (Signed)
Needs bw for Wellness on 5/1  Call when sent

## 2014-01-09 NOTE — Telephone Encounter (Signed)
Lip liv m7 psa 

## 2014-01-09 NOTE — Telephone Encounter (Signed)
Cell # disconnected, Home # unavailable. Blood work has been ordered in the system.

## 2014-01-20 ENCOUNTER — Encounter: Payer: Medicare HMO | Admitting: Family Medicine

## 2014-04-04 ENCOUNTER — Telehealth: Payer: Self-pay | Admitting: Family Medicine

## 2014-04-04 DIAGNOSIS — I10 Essential (primary) hypertension: Secondary | ICD-10-CM

## 2014-04-04 DIAGNOSIS — E785 Hyperlipidemia, unspecified: Secondary | ICD-10-CM

## 2014-04-04 DIAGNOSIS — Z79899 Other long term (current) drug therapy: Secondary | ICD-10-CM

## 2014-04-04 DIAGNOSIS — Z125 Encounter for screening for malignant neoplasm of prostate: Secondary | ICD-10-CM

## 2014-04-04 NOTE — Telephone Encounter (Signed)
Patient needs lab paper has wellness scheduled for 04/26/14.

## 2014-04-06 NOTE — Telephone Encounter (Signed)
Lipid/liver/met 7/PSA and f/u ov

## 2014-04-07 NOTE — Addendum Note (Signed)
Addended byOneal Deputy: Alexandra Lipps D on: 04/07/2014 09:12 AM   Modules accepted: Orders

## 2014-04-07 NOTE — Telephone Encounter (Signed)
Left VM stating BW orders are in and to schedule f/u OV.

## 2014-04-18 LAB — BASIC METABOLIC PANEL
BUN: 12 mg/dL (ref 6–23)
CO2: 26 mEq/L (ref 19–32)
Calcium: 8.8 mg/dL (ref 8.4–10.5)
Chloride: 104 mEq/L (ref 96–112)
Creat: 1.06 mg/dL (ref 0.50–1.35)
Glucose, Bld: 103 mg/dL — ABNORMAL HIGH (ref 70–99)
POTASSIUM: 4 meq/L (ref 3.5–5.3)
SODIUM: 138 meq/L (ref 135–145)

## 2014-04-18 LAB — LIPID PANEL
Cholesterol: 137 mg/dL (ref 0–200)
HDL: 32 mg/dL — AB (ref >39)
LDL CALC: 66 mg/dL (ref 0–99)
Total CHOL/HDL Ratio: 4.3 Ratio
Triglycerides: 193 mg/dL — ABNORMAL HIGH (ref <150)
VLDL: 39 mg/dL (ref 0–40)

## 2014-04-18 LAB — HEPATIC FUNCTION PANEL
ALBUMIN: 4 g/dL (ref 3.5–5.2)
ALK PHOS: 54 U/L (ref 39–117)
ALT: 19 U/L (ref 0–53)
AST: 18 U/L (ref 0–37)
BILIRUBIN INDIRECT: 0.3 mg/dL (ref 0.2–1.2)
Bilirubin, Direct: 0.1 mg/dL (ref 0.0–0.3)
TOTAL PROTEIN: 6.5 g/dL (ref 6.0–8.3)
Total Bilirubin: 0.4 mg/dL (ref 0.2–1.2)

## 2014-04-19 LAB — PSA, MEDICARE: PSA: 3.34 ng/mL (ref <=4.00)

## 2014-04-26 ENCOUNTER — Encounter: Payer: Self-pay | Admitting: Family Medicine

## 2014-04-26 ENCOUNTER — Ambulatory Visit (INDEPENDENT_AMBULATORY_CARE_PROVIDER_SITE_OTHER): Payer: Commercial Managed Care - HMO | Admitting: Family Medicine

## 2014-04-26 VITALS — BP 142/94 | HR 70 | Ht 71.25 in | Wt 235.0 lb

## 2014-04-26 DIAGNOSIS — Z0000 Encounter for general adult medical examination without abnormal findings: Secondary | ICD-10-CM

## 2014-04-26 DIAGNOSIS — I10 Essential (primary) hypertension: Secondary | ICD-10-CM

## 2014-04-26 DIAGNOSIS — M25559 Pain in unspecified hip: Secondary | ICD-10-CM

## 2014-04-26 DIAGNOSIS — R7301 Impaired fasting glucose: Secondary | ICD-10-CM

## 2014-04-26 DIAGNOSIS — E785 Hyperlipidemia, unspecified: Secondary | ICD-10-CM

## 2014-04-26 MED ORDER — AMLODIPINE BESYLATE 10 MG PO TABS: 10.0000 mg | ORAL_TABLET | Freq: Every day | ORAL | Status: DC

## 2014-04-26 MED ORDER — BENAZEPRIL HCL 40 MG PO TABS: 40.0000 mg | ORAL_TABLET | Freq: Two times a day (BID) | ORAL | Status: DC

## 2014-04-26 MED ORDER — SILDENAFIL CITRATE 50 MG PO TABS: ORAL_TABLET | ORAL | Status: DC

## 2014-04-26 MED ORDER — ATENOLOL 50 MG PO TABS: 50.0000 mg | ORAL_TABLET | Freq: Two times a day (BID) | ORAL | Status: DC

## 2014-04-26 MED ORDER — GEMFIBROZIL 600 MG PO TABS: 600.0000 mg | ORAL_TABLET | Freq: Two times a day (BID) | ORAL | Status: DC
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
## 2014-04-28 ENCOUNTER — Telehealth: Payer: Self-pay | Admitting: Internal Medicine

## 2014-04-28 NOTE — Telephone Encounter (Signed)
Pt called today to let us know that Dr Gerda DissLuking had suggested that patient call us to be set up for his 10 yr colonoscopy. [phone] or [phone]

## 2014-05-01 NOTE — Telephone Encounter (Signed)
Pt called back again today to see when he could be set up for his TCS. I told him that DS was aware, but she left early today and that I would let her know in the morning to follow up with him

## 2014-05-02 NOTE — Telephone Encounter (Signed)
Noted. Agree.

## 2014-05-02 NOTE — Telephone Encounter (Signed)
Agree 

## 2014-05-02 NOTE — Telephone Encounter (Signed)
Reminder in epic to follow up in Jan 2016 for triage

## 2014-05-02 NOTE — Telephone Encounter (Signed)
Pt was referred by Dr. Gerda DissLuking for 10 year screening colonoscopy. He is not having any problems and no family hx of colon cancer. His last was done 09/20/2004 by Dr. Jena Gaussourk and next recommended in 10 years. He is not due until 08/2014 and he wants me to put him on my call list for early Jan 2016. He will call if he has problems before then.

## 2014-07-07 ENCOUNTER — Telehealth: Payer: Self-pay | Admitting: Family Medicine

## 2014-07-07 DIAGNOSIS — M1612 Unilateral primary osteoarthritis, left hip: Secondary | ICD-10-CM

## 2014-07-07 NOTE — Telephone Encounter (Signed)
[facility] called and said that patient was referred to Dr. Zachery DauerBarnes for hip pain.  They discovered that he would need a referral to Dr. Charlann Boxerlin for surgery for left hip hardware removal. They are requesting this ASAP.  Dr. Charlann Boxerlin NPI# [id number] Diagnosis: [id number]

## 2014-07-07 NOTE — Telephone Encounter (Signed)
Ok lets 

## 2014-07-07 NOTE — Telephone Encounter (Signed)
Also, please review pre-operative clearance on patients chart.

## 2014-07-10 NOTE — Telephone Encounter (Signed)
Referral done and ortho office notified per Referral Coordinator.

## 2014-07-11 NOTE — Progress Notes (Signed)
Please put orders in Epic surgery 07-24-14 pre op 07-18-14 Thanks

## 2014-07-12 ENCOUNTER — Encounter (HOSPITAL_COMMUNITY): Payer: Self-pay | Admitting: Pharmacy Technician

## 2014-07-18 ENCOUNTER — Encounter (HOSPITAL_COMMUNITY): Payer: Self-pay

## 2014-07-18 ENCOUNTER — Encounter (HOSPITAL_COMMUNITY)
Admission: RE | Admit: 2014-07-18 | Discharge: 2014-07-18 | Disposition: A | Discharge status: Home / Self Care | Payer: Medicare HMO | Source: Ambulatory Visit | Attending: Orthopedic Surgery | Admitting: Orthopedic Surgery

## 2014-07-18 ENCOUNTER — Ambulatory Visit (HOSPITAL_COMMUNITY)
Admission: RE | Admit: 2014-07-18 | Discharge: 2014-07-18 | Disposition: A | Discharge status: Home / Self Care | Payer: Medicare HMO | Source: Ambulatory Visit | Attending: Anesthesiology | Admitting: Anesthesiology

## 2014-07-18 DIAGNOSIS — R001 Bradycardia, unspecified: Secondary | ICD-10-CM | POA: Diagnosis not present

## 2014-07-18 DIAGNOSIS — Z01810 Encounter for preprocedural cardiovascular examination: Secondary | ICD-10-CM | POA: Insufficient documentation

## 2014-07-18 DIAGNOSIS — R918 Other nonspecific abnormal finding of lung field: Secondary | ICD-10-CM | POA: Insufficient documentation

## 2014-07-18 DIAGNOSIS — I10 Essential (primary) hypertension: Secondary | ICD-10-CM | POA: Insufficient documentation

## 2014-07-18 HISTORY — DX: Unspecified osteoarthritis, unspecified site: M19.90

## 2014-07-18 LAB — CBC
HEMATOCRIT: 43.5 % (ref 39.0–52.0)
Hemoglobin: 15 g/dL (ref 13.0–17.0)
MCH: 31.1 pg (ref 26.0–34.0)
MCHC: 34.5 g/dL (ref 30.0–36.0)
MCV: 90.1 fL (ref 78.0–100.0)
Platelets: 247 10*3/uL (ref 150–400)
RBC: 4.83 MIL/uL (ref 4.22–5.81)
RDW: 13.8 % (ref 11.5–15.5)
WBC: 8.4 10*3/uL (ref 4.0–10.5)

## 2014-07-18 LAB — BASIC METABOLIC PANEL
ANION GAP: 12 (ref 5–15)
BUN: 12 mg/dL (ref 6–23)
CHLORIDE: 99 meq/L (ref 96–112)
CO2: 27 meq/L (ref 19–32)
Calcium: 9.7 mg/dL (ref 8.4–10.5)
Creatinine, Ser: 1.05 mg/dL (ref 0.50–1.35)
GFR calc non Af Amer: 69 mL/min — ABNORMAL LOW (ref >90)
GFR, EST AFRICAN AMERICAN: 80 mL/min — AB (ref >90)
Glucose, Bld: 105 mg/dL — ABNORMAL HIGH (ref 70–99)
POTASSIUM: 4.5 meq/L (ref 3.7–5.3)
Sodium: 138 mEq/L (ref 137–147)

## 2014-07-18 LAB — PROTIME-INR
INR: 1.1 (ref 0.00–1.49)
PROTHROMBIN TIME: 14.3 s (ref 11.6–15.2)

## 2014-07-18 LAB — APTT: aPTT: 30 seconds (ref 24–37)

## 2014-07-18 LAB — URINALYSIS, ROUTINE W REFLEX MICROSCOPIC
BILIRUBIN URINE: NEGATIVE
Glucose, UA: NEGATIVE mg/dL
Hgb urine dipstick: NEGATIVE
KETONES UR: NEGATIVE mg/dL
Leukocytes, UA: NEGATIVE
NITRITE: NEGATIVE
PROTEIN: NEGATIVE mg/dL
Specific Gravity, Urine: 1.012 (ref 1.005–1.030)
Urobilinogen, UA: 0.2 mg/dL (ref 0.0–1.0)
pH: 7 (ref 5.0–8.0)

## 2014-07-18 LAB — ABO/RH: ABO/RH(D): A POS

## 2014-07-18 LAB — SURGICAL PCR SCREEN
MRSA, PCR: NEGATIVE
Staphylococcus aureus: NEGATIVE

## 2014-07-18 NOTE — Patient Instructions (Addendum)
[facility]ED[facility]  ON:  Monday  11/2  REPORT TO  SHORT STAY CENTER AT:  11:30 AM   DO NOT EAT  ANYTHING AFTER MIDNIGHT THE NIGHT BEFORE YOUR SURGERY.  NO FOOD, NO CHEWING GUM, NO MINTS, NO CANDIES, NO CHEWING TOBACCO. YOU MAY HAVE CLEAR LIQUIDS TO DRINK FROM MIDNIGHT UNTIL 7:30 AM DAY OF SURGERY - LIKE WATER, SODA.     NOTHING TO DRINK AFTER 7:30 AM DAY OF SURGERY.  PLEASE TAKE THE FOLLOWING MEDICATIONS THE AM OF YOUR SURGERY WITH A FEW SIPS OF WATER:  AMLODIPINE AND ATENOLOL.    DO NOT BRING VALUABLES, MONEY, CREDIT CARDS.  DO NOT WEAR JEWELRY, MAKE-UP, NAIL POLISH AND NO METAL PINS OR CLIPS IN YOUR HAIR. CONTACT LENS, DENTURES / PARTIALS, GLASSES SHOULD NOT BE WORN TO SURGERY AND IN MOST CASES-HEARING AIDS WILL NEED TO BE REMOVED.  BRING YOUR GLASSES CASE, ANY EQUIPMENT NEEDED FOR YOUR CONTACT LENS. FOR PATIENTS ADMITTED TO THE HOSPITAL--CHECK OUT TIME THE DAY OF DISCHARGE IS 11:00 AM.  ALL INPATIENT ROOMS ARE PRIVATE - WITH BATHROOM, TELEPHONE, TELEVISION AND WIFI INTERNET.    PLEASE BE AWARE THAT YOU MAY NEED ADDITIONAL BLOOD DRAWN DAY OF YOUR SURGERY  _______________________________________________________________________   [facility] - Preparing for Surgery Before surgery, you can play an important role.  Because skin is not sterile, your skin needs to be as free of germs as possible.  You can reduce the number of germs on your skin by washing with CHG (chlorahexidine gluconate) soap before surgery.  CHG is an antiseptic cleaner which kills germs and bonds with the skin to continue killing germs even after washing. Please DO NOT use if you have an allergy to CHG or antibacterial soaps.  If your skin becomes reddened/irritated stop using the CHG and inform your nurse when you arrive at Short Stay. Do not shave (including legs and underarms) for at least 48 hours prior to the first CHG shower.  You may shave your face/neck. Please follow these  instructions carefully:  1.  Shower with CHG Soap the night before surgery and the  morning of Surgery.  2.  If you choose to wash your hair, wash your hair first as usual with your  normal  shampoo.  3.  After you shampoo, rinse your hair and body thoroughly to remove the  shampoo.                           4.  Use CHG as you would any other liquid soap.  You can apply chg directly  to the skin and wash                       Gently with a scrungie or clean washcloth.  5.  Apply the CHG Soap to your body ONLY FROM THE NECK DOWN.   Do not use on face/ open                           Wound or open sores. Avoid contact with eyes, ears mouth and genitals (private parts).                       Wash face,  Genitals (private parts) with your normal soap.             6.  Wash thoroughly, paying special  attention to the area where your surgery  will be performed.  7.  Thoroughly rinse your body with warm water from the neck down.  8.  DO NOT shower/wash with your normal soap after using and rinsing off  the CHG Soap.                9.  Pat yourself dry with a clean towel.            10.  Wear clean pajamas.            11.  Place clean sheets on your bed the night of your first shower and do not  sleep with pets. Day of Surgery : Do not apply any lotions/deodorants the morning of surgery.  Please wear clean clothes to the hospital/surgery center.  FAILURE TO FOLLOW THESE INSTRUCTIONS MAY RESULT IN THE CANCELLATION OF YOUR SURGERY PATIENT SIGNATURE_________________________________  NURSE SIGNATURE__________________________________  ________________________________________________________________________   Hector [name]  An incentive spirometer is a tool that can help keep your lungs clear and active. This tool measures how well you are filling your lungs with each breath. Taking long deep breaths may help reverse or decrease the chance of developing breathing (pulmonary) problems (especially  infection) following:  A long period of time when you are unable to move or be active. BEFORE THE PROCEDURE   If the spirometer includes an indicator to show your best effort, your nurse or respiratory therapist will set it to a desired goal.  If possible, sit up straight or lean slightly forward. Try not to slouch.  Hold the incentive spirometer in an upright position. INSTRUCTIONS FOR USE  1. Sit on the edge of your bed if possible, or sit up as far as you can in bed or on a chair. 2. Hold the incentive spirometer in an upright position. 3. Breathe out normally. 4. Place the mouthpiece in your mouth and seal your lips tightly around it. 5. Breathe in slowly and as deeply as possible, raising the piston or the ball toward the top of the column. 6. Hold your breath for 3-5 seconds or for as long as possible. Allow the piston or ball to fall to the bottom of the column. 7. Remove the mouthpiece from your mouth and breathe out normally. 8. Rest for a few seconds and repeat Steps 1 through 7 at least 10 times every 1-2 hours when you are awake. Take your time and take a few normal breaths between deep breaths. 9. The spirometer may include an indicator to show your best effort. Use the indicator as a goal to work toward during each repetition. 10. After each set of 10 deep breaths, practice coughing to be sure your lungs are clear. If you have an incision (the cut made at the time of surgery), support your incision when coughing by placing a pillow or rolled up towels firmly against it. Once you are able to get out of bed, walk around indoors and cough well. You may stop using the incentive spirometer when instructed by your caregiver.  RISKS AND COMPLICATIONS  Take your time so you do not get dizzy or light-headed.  If you are in pain, you may need to take or ask for pain medication before doing incentive spirometry. It is harder to take a deep breath if you are having pain. AFTER  USE  Rest and breathe slowly and easily.  It can be helpful to keep track of a log of your progress. Your caregiver can provide  you with a simple table to help with this. If you are using the spirometer at home, follow these instructions: SEEK MEDICAL CARE IF:   You are having difficultly using the spirometer.  You have trouble using the spirometer as often as instructed.  Your pain medication is not giving enough relief while using the spirometer.  You develop fever of 100.5 F (38.1 C) or higher. SEEK IMMEDIATE MEDICAL CARE IF:   You cough up bloody sputum that had not been present before.  You develop fever of 102 F (38.9 C) or greater.  You develop worsening pain at or near the incision site. MAKE SURE YOU:   Understand these instructions.  Will watch your condition.  Will get help right away if you are not doing well or get worse. Document Released: 01/19/2007 Document Revised: 12/01/2011 Document Reviewed: 03/22/2007 ExitCare Patient Information 2014 ExitCare, [place].   ________________________________________________________________________  WHAT IS A BLOOD TRANSFUSION? Blood Transfusion Information  A transfusion is the replacement of blood or some of its parts. Blood is made up of multiple cells which provide different functions.  Red blood cells carry oxygen and are used for blood loss replacement.  White blood cells fight against infection.  Platelets control bleeding.  Plasma helps clot blood.  Other blood products are available for specialized needs, such as hemophilia or other clotting disorders. BEFORE THE TRANSFUSION  Who gives blood for transfusions?   Healthy volunteers who are fully evaluated to make sure their blood is safe. This is blood bank blood. Transfusion therapy is the safest it has ever been in the practice of medicine. Before blood is taken from a donor, a complete history is taken to make sure that person has no history of diseases  nor engages in risky social behavior (examples are intravenous drug use or sexual activity with multiple partners). The donor's travel history is screened to minimize risk of transmitting infections, such as malaria. The donated blood is tested for signs of infectious diseases, such as HIV and hepatitis. The blood is then tested to be sure it is compatible with you in order to minimize the chance of a transfusion reaction. If you or a relative donates blood, this is often done in anticipation of surgery and is not appropriate for emergency situations. It takes many days to process the donated blood. RISKS AND COMPLICATIONS Although transfusion therapy is very safe and saves many lives, the main dangers of transfusion include:   Getting an infectious disease.  Developing a transfusion reaction. This is an allergic reaction to something in the blood you were given. Every precaution is taken to prevent this. The decision to have a blood transfusion has been considered carefully by your caregiver before blood is given. Blood is not given unless the benefits outweigh the risks. AFTER THE TRANSFUSION  Right after receiving a blood transfusion, you will usually feel much better and more energetic. This is especially true if your red blood cells have gotten low (anemic). The transfusion raises the level of the red blood cells which carry oxygen, and this usually causes an energy increase.  The nurse administering the transfusion will monitor you carefully for complications. HOME CARE INSTRUCTIONS  No special instructions are needed after a transfusion. You may find your energy is better. Speak with your caregiver about any limitations on activity for underlying diseases you may have. SEEK MEDICAL CARE IF:   Your condition is not improving after your transfusion.  You develop redness or irritation at the intravenous (IV) site.  SEEK IMMEDIATE MEDICAL CARE IF:  Any of the following symptoms occur over the  next 12 hours:  Shaking chills.  You have a temperature by mouth above 102 F (38.9 C), not controlled by medicine.  Chest, back, or muscle pain.  People around you feel you are not acting correctly or are confused.  Shortness of breath or difficulty breathing.  Dizziness and fainting.  You get a rash or develop hives.  You have a decrease in urine output.  Your urine turns a dark color or changes to pink, red, or brown. Any of the following symptoms occur over the next 10 days:  You have a temperature by mouth above 102 F (38.9 C), not controlled by medicine.  Shortness of breath.  Weakness after normal activity.  The white part of the eye turns yellow (jaundice).  You have a decrease in the amount of urine or are urinating less often.  Your urine turns a dark color or changes to pink, red, or brown. Document Released: 09/05/2000 Document Revised: 12/01/2011 Document Reviewed: 04/24/2008 [facility] Patient Information 2014 [place].  _______________________________________________________________________

## 2014-07-18 NOTE — Pre-Procedure Instructions (Signed)
EKG AND CXR WERE DONE TODAY PREOP AT [facility]. PT HAS NOTE OF MEDICAL CLEARANCE ON CHART FROM DR. Gerda DissLUKING.

## 2014-07-18 NOTE — Progress Notes (Signed)
07/18/14 1128  OBSTRUCTIVE SLEEP APNEA  Have you ever been diagnosed with sleep apnea through a sleep study? No  Do you snore loudly (loud enough to be heard through closed doors)?  0  Do you often feel tired, fatigued, or sleepy during the daytime? 0  Has anyone observed you stop breathing during your sleep? 0  Do you have, or are you being treated for high blood pressure? 1  BMI more than 35 kg/m2? 0  Age over 71 years old? 1  Neck circumference greater than 40 cm/16 inches? 1  Gender: 1  Obstructive Sleep Apnea Score 4  Score 4 or greater  Results sent to PCP

## 2014-07-20 NOTE — H&P (Signed)
TOTAL HIP ADMISSION H&P  Patient is admitted for left total hip arthroplasty, posterior approach.  Subjective:  Chief Complaint:    Left hip post-traumatic OA / pain  HPI: Hector [name], 73 y.o. male, has a history of pain and functional disability in the left hip([name]) due to trauma and arthritis and patient has failed non-surgical conservative treatments for greater than 12 weeks to include NSAID'[name] and/or analgesics, use of assistive devices and activity modification.  Onset of symptoms was abrupt starting 6 years ago with gradually worsening course since that time.The patient noted prior procedures of the hip to include ORIF on the left hip([name]).  Patient currently rates pain in the left hip at 9 out of 10 with activity. Patient has night pain, worsening of pain with activity and weight bearing, trendelenberg gait, pain that interfers with activities of daily living and pain with passive range of motion. Patient has evidence of periarticular osteophytes, joint space narrowing and hardware from previous surgery by imaging studies. This condition presents safety issues increasing the risk of falls.  There is no current active infection.  Risks, benefits and expectations were discussed with the patient.  Risks including but not limited to the risk of anesthesia, blood clots, nerve damage, blood vessel damage, failure of the prosthesis, infection and up to and including death.  Patient understand the risks, benefits and expectations and wishes to proceed with surgery.   PCP: Hector [name],Hector [name], Hector [name]  D/C Plans:      Home with HHPT  Post-op Meds:       No Rx given  Tranexamic Acid:      To be given - IV    Decadron:      Is to be given  FYI:     ASA post-op   Norco post-op  Posterior hip precautions    Patient Active Problem List   Diagnosis Date Noted  . Essential hypertension, benign 01/18/2013  . Other and unspecified hyperlipidemia 01/18/2013  . Impaired fasting glucose 01/18/2013  . Sensory  neuropathy 01/18/2013  . Erectile dysfunction 01/18/2013   Past Medical History  Diagnosis Date  . Hypertension   . Hyperlipidemia   . Obesity   . ED (erectile dysfunction)   . IFG (impaired fasting glucose)   . Noncompliance   . Chronic hip pain     LEFT HIP  . Arthritis     PAIN AND OA LEFT HIP; ARTHRITIS BOTH KNEES    Past Surgical History  Procedure Laterality Date  . Hip fracture surgery  2009    LEFT HIP  . Refractive surgery Bilateral     LASIK  . Knee arthrotomy bilateral 50 + yrs ago    . Bilateral knee arthroscopy    . Cholecystectomy  2010    No prescriptions prior to admission   Allergies  Allergen Reactions  . Hctz [Hydrochlorothiazide]     Erectile Dysfunction  . Lipitor [Atorvastatin]     Muscle and joint pain    History  Substance Use Topics  . Smoking status: Former Games developermoker  . Smokeless tobacco: Never Used     Comment: quit 40-50 years ago  . Alcohol Use: No     Comment: QUIT SMOKING AGE 56    Family History  Problem Relation Age of Onset  . Hypertension Mother   . Diabetes Mother   . Hypertension Father   . Heart attack Father   . Diabetes Sister      Review of Systems  Constitutional: Negative.  HENT: Positive for hearing loss.   Eyes: Negative.   Respiratory: Negative.   Cardiovascular: Negative.   Gastrointestinal: Negative.   Genitourinary: Negative.   Musculoskeletal: Positive for joint pain.  Skin: Negative.   Neurological: Negative.   Endo/Heme/Allergies: Negative.   Psychiatric/Behavioral: Negative.     Objective:  Physical Exam  Constitutional: He is oriented to person, place, and time. He appears well-developed and well-nourished.  HENT:  Head: Normocephalic and atraumatic.  Eyes: Pupils are equal, round, and reactive to light.  Neck: Neck supple. No JVD present. No tracheal deviation present. No thyromegaly present.  Cardiovascular: Normal rate, regular rhythm, normal heart sounds and intact distal pulses.    Respiratory: Effort normal and breath sounds normal. No stridor. No respiratory distress. He has no wheezes.  GI: Soft. There is no tenderness. There is no guarding.  Musculoskeletal:       Left hip: He exhibits decreased range of motion, decreased strength, tenderness and bony tenderness. He exhibits no swelling, no deformity and no laceration.  Lymphadenopathy:    He has no cervical adenopathy.  Neurological: He is alert and oriented to person, place, and time.  Skin: Skin is warm and dry.  Psychiatric: He has a normal mood and affect.      Labs:  Estimated body mass index is 32.54 kg/(m^2) as calculated from the following:   Height as of 04/26/14: 5' 11.25" (1.81 m).   Weight as of 04/26/14: 106.595 kg (235 lb).   Imaging Review Plain radiographs demonstrate severe degenerative joint disease of the left hip([name]). The bone quality appears to be good for age and reported activity level.  Assessment/Plan:  End stage arthritis, left hip([name])  The patient history, physical examination, clinical judgement of the provider and imaging studies are consistent with end stage degenerative joint disease of the left hip([name]) and total hip arthroplasty is deemed medically necessary. The treatment options including medical management, injection therapy, arthroscopy and arthroplasty were discussed at length. The risks and benefits of total hip arthroplasty were presented and reviewed. The risks due to aseptic loosening, infection, stiffness, dislocation/subluxation,  thromboembolic complications and other imponderables were discussed.  The patient acknowledged the explanation, agreed to proceed with the plan and consent was signed. Patient is being admitted for inpatient treatment for surgery, pain control, PT, OT, prophylactic antibiotics, VTE prophylaxis, progressive ambulation and ADL'[name] and discharge planning.The patient is planning to be discharged home with home health services.    [name]. Hector Gobert    PA-C  07/20/2014, 12:51 PM

## 2014-07-24 ENCOUNTER — Inpatient Hospital Stay (HOSPITAL_COMMUNITY): Payer: Commercial Managed Care - HMO | Admitting: Anesthesiology

## 2014-07-24 ENCOUNTER — Encounter (HOSPITAL_COMMUNITY)
Admission: RE | Disposition: A | Discharge status: Home Health | Payer: Self-pay | Source: Ambulatory Visit | Attending: Orthopedic Surgery

## 2014-07-24 ENCOUNTER — Encounter (HOSPITAL_COMMUNITY): Payer: Self-pay | Admitting: *Deleted

## 2014-07-24 ENCOUNTER — Inpatient Hospital Stay (HOSPITAL_COMMUNITY): Payer: Commercial Managed Care - HMO

## 2014-07-24 ENCOUNTER — Inpatient Hospital Stay (HOSPITAL_COMMUNITY)
Admission: RE | Admit: 2014-07-24 | Discharge: 2014-07-25 | DRG: 470 | Disposition: A | Discharge status: Home Health | Payer: Commercial Managed Care - HMO | Source: Ambulatory Visit | Attending: Orthopedic Surgery | Admitting: Orthopedic Surgery

## 2014-07-24 DIAGNOSIS — Z87891 Personal history of nicotine dependence: Secondary | ICD-10-CM | POA: Diagnosis not present

## 2014-07-24 DIAGNOSIS — I10 Essential (primary) hypertension: Secondary | ICD-10-CM | POA: Diagnosis present

## 2014-07-24 DIAGNOSIS — E785 Hyperlipidemia, unspecified: Secondary | ICD-10-CM | POA: Diagnosis present

## 2014-07-24 DIAGNOSIS — M1652 Unilateral post-traumatic osteoarthritis, left hip: Principal | ICD-10-CM | POA: Diagnosis present

## 2014-07-24 DIAGNOSIS — Z96649 Presence of unspecified artificial hip joint: Secondary | ICD-10-CM

## 2014-07-24 DIAGNOSIS — Z9889 Other specified postprocedural states: Secondary | ICD-10-CM

## 2014-07-24 HISTORY — PX: TOTAL HIP ARTHROPLASTY: SHX124

## 2014-07-24 LAB — TYPE AND SCREEN
ABO/RH(D): A POS
Antibody Screen: NEGATIVE

## 2014-07-24 SURGERY — ARTHROPLASTY, HIP, TOTAL,POSTERIOR APPROACH
Anesthesia: Monitor Anesthesia Care | Site: Hip | Laterality: Left

## 2014-07-24 MED ORDER — LIP MEDEX EX OINT
TOPICAL_OINTMENT | CUTANEOUS | Status: AC
  Administered 2014-07-24: 1
  Filled 2014-07-24: qty 7

## 2014-07-24 MED ORDER — FENTANYL CITRATE 0.05 MG/ML IJ SOLN
INTRAMUSCULAR | Status: AC
  Filled 2014-07-24: qty 2

## 2014-07-24 MED ORDER — PHENOL 1.4 % MT LIQD
1.0000 | OROMUCOSAL | Status: DC | PRN
Start: 2014-07-24 — End: 2014-07-25
  Filled 2014-07-24: qty 177

## 2014-07-24 MED ORDER — EPHEDRINE SULFATE 50 MG/ML IJ SOLN
INTRAMUSCULAR | Status: DC | PRN
  Administered 2014-07-24 (×5): 10 mg via INTRAVENOUS

## 2014-07-24 MED ORDER — GEMFIBROZIL 600 MG PO TABS
600.0000 mg | ORAL_TABLET | Freq: Two times a day (BID) | ORAL | Status: DC
  Administered 2014-07-25: 600 mg via ORAL
  Filled 2014-07-24 (×3): qty 1

## 2014-07-24 MED ORDER — CELECOXIB 200 MG PO CAPS
200.0000 mg | ORAL_CAPSULE | Freq: Two times a day (BID) | ORAL | Status: DC
  Administered 2014-07-24 – 2014-07-25 (×2): 200 mg via ORAL
  Filled 2014-07-24 (×3): qty 1

## 2014-07-24 MED ORDER — AMLODIPINE BESYLATE 10 MG PO TABS
10.0000 mg | ORAL_TABLET | Freq: Every day | ORAL | Status: DC
  Administered 2014-07-25: 10 mg via ORAL
  Filled 2014-07-24: qty 1

## 2014-07-24 MED ORDER — CHLORHEXIDINE GLUCONATE 4 % EX LIQD: 60.0000 mL | Freq: Once | CUTANEOUS | Status: DC

## 2014-07-24 MED ORDER — MENTHOL 3 MG MT LOZG
1.0000 | LOZENGE | OROMUCOSAL | Status: DC | PRN
  Filled 2014-07-24: qty 9

## 2014-07-24 MED ORDER — MAGNESIUM CITRATE PO SOLN: 1.0000 | Freq: Once | ORAL | Status: AC | PRN

## 2014-07-24 MED ORDER — METOCLOPRAMIDE HCL 10 MG PO TABS: 5.0000 mg | ORAL_TABLET | Freq: Three times a day (TID) | ORAL | Status: DC | PRN

## 2014-07-24 MED ORDER — PROPOFOL INFUSION 10 MG/ML OPTIME
INTRAVENOUS | Status: DC | PRN
  Administered 2014-07-24: 100 ug/kg/min via INTRAVENOUS

## 2014-07-24 MED ORDER — BISACODYL 10 MG RE SUPP: 10.0000 mg | Freq: Every day | RECTAL | Status: DC | PRN

## 2014-07-24 MED ORDER — POLYETHYLENE GLYCOL 3350 17 G PO PACK
17.0000 g | PACK | Freq: Two times a day (BID) | ORAL | Status: DC
  Administered 2014-07-24 – 2014-07-25 (×2): 17 g via ORAL

## 2014-07-24 MED ORDER — 0.9 % SODIUM CHLORIDE (POUR BTL) OPTIME
TOPICAL | Status: DC | PRN
  Administered 2014-07-24: 1000 mL

## 2014-07-24 MED ORDER — PROPOFOL 10 MG/ML IV BOLUS
INTRAVENOUS | Status: AC
  Filled 2014-07-24: qty 20

## 2014-07-24 MED ORDER — MIDAZOLAM HCL 5 MG/5ML IJ SOLN
INTRAMUSCULAR | Status: DC | PRN
  Administered 2014-07-24: 2 mg via INTRAVENOUS

## 2014-07-24 MED ORDER — BENAZEPRIL HCL 40 MG PO TABS
40.0000 mg | ORAL_TABLET | Freq: Two times a day (BID) | ORAL | Status: DC
  Administered 2014-07-24 – 2014-07-25 (×2): 40 mg via ORAL
  Filled 2014-07-24 (×3): qty 1

## 2014-07-24 MED ORDER — CEFAZOLIN SODIUM-DEXTROSE 2-3 GM-% IV SOLR
2.0000 g | INTRAVENOUS | Status: AC
Start: 2014-07-24 — End: 2014-07-24
  Administered 2014-07-24: 2 g via INTRAVENOUS

## 2014-07-24 MED ORDER — CEFAZOLIN SODIUM-DEXTROSE 2-3 GM-% IV SOLR
2.0000 g | Freq: Four times a day (QID) | INTRAVENOUS | Status: AC
  Administered 2014-07-24 – 2014-07-25 (×2): 2 g via INTRAVENOUS
  Filled 2014-07-24 (×2): qty 50

## 2014-07-24 MED ORDER — ACETAMINOPHEN 325 MG PO TABS: 325.0000 mg | ORAL_TABLET | ORAL | Status: DC | PRN

## 2014-07-24 MED ORDER — BUPIVACAINE HCL (PF) 0.5 % IJ SOLN
INTRAMUSCULAR | Status: AC
  Filled 2014-07-24: qty 30

## 2014-07-24 MED ORDER — METHOCARBAMOL 1000 MG/10ML IJ SOLN
500.0000 mg | Freq: Four times a day (QID) | INTRAVENOUS | Status: DC | PRN
  Administered 2014-07-24: 500 mg via INTRAVENOUS
  Filled 2014-07-24 (×2): qty 5

## 2014-07-24 MED ORDER — ALUM & MAG HYDROXIDE-SIMETH 200-200-20 MG/5ML PO SUSP
30.0000 mL | ORAL | Status: DC | PRN
Start: 2014-07-24 — End: 2014-07-25

## 2014-07-24 MED ORDER — HYDROMORPHONE HCL 1 MG/ML IJ SOLN
0.5000 mg | INTRAMUSCULAR | Status: DC | PRN
  Administered 2014-07-24: 1 mg via INTRAVENOUS
  Filled 2014-07-24: qty 1

## 2014-07-24 MED ORDER — ONDANSETRON HCL 4 MG PO TABS: 4.0000 mg | ORAL_TABLET | Freq: Four times a day (QID) | ORAL | Status: DC | PRN

## 2014-07-24 MED ORDER — DEXAMETHASONE SODIUM PHOSPHATE 10 MG/ML IJ SOLN
10.0000 mg | Freq: Once | INTRAMUSCULAR | Status: AC
  Administered 2014-07-24: 10 mg via INTRAVENOUS

## 2014-07-24 MED ORDER — SODIUM CHLORIDE 0.9 % IJ SOLN
INTRAMUSCULAR | Status: AC
  Filled 2014-07-24: qty 10

## 2014-07-24 MED ORDER — BUPIVACAINE HCL (PF) 0.5 % IJ SOLN
INTRAMUSCULAR | Status: DC | PRN
  Administered 2014-07-24: 3 mL

## 2014-07-24 MED ORDER — LACTATED RINGERS IV SOLN
INTRAVENOUS | Status: DC
  Administered 2014-07-24: 1000 mL via INTRAVENOUS
  Administered 2014-07-24 (×2): via INTRAVENOUS

## 2014-07-24 MED ORDER — OXYCODONE HCL 5 MG PO TABS: 5.0000 mg | ORAL_TABLET | Freq: Once | ORAL | Status: DC | PRN

## 2014-07-24 MED ORDER — LIDOCAINE HCL (CARDIAC) 20 MG/ML IV SOLN
INTRAVENOUS | Status: AC
  Filled 2014-07-24: qty 5

## 2014-07-24 MED ORDER — ACETAMINOPHEN 160 MG/5ML PO SOLN
325.0000 mg | ORAL | Status: DC | PRN
  Filled 2014-07-24: qty 20.3

## 2014-07-24 MED ORDER — METOCLOPRAMIDE HCL 5 MG/ML IJ SOLN: 5.0000 mg | Freq: Three times a day (TID) | INTRAMUSCULAR | Status: DC | PRN

## 2014-07-24 MED ORDER — HYDROCODONE-ACETAMINOPHEN 7.5-325 MG PO TABS
1.0000 | ORAL_TABLET | ORAL | Status: DC
  Administered 2014-07-24: 1 via ORAL
  Administered 2014-07-25 (×4): 2 via ORAL
  Filled 2014-07-24: qty 2
  Filled 2014-07-24: qty 1
  Filled 2014-07-24 (×3): qty 2

## 2014-07-24 MED ORDER — EPHEDRINE SULFATE 50 MG/ML IJ SOLN
INTRAMUSCULAR | Status: AC
  Filled 2014-07-24: qty 1

## 2014-07-24 MED ORDER — HYDROMORPHONE HCL 1 MG/ML IJ SOLN: 0.2500 mg | INTRAMUSCULAR | Status: DC | PRN

## 2014-07-24 MED ORDER — DIPHENHYDRAMINE HCL 25 MG PO CAPS: 25.0000 mg | ORAL_CAPSULE | Freq: Four times a day (QID) | ORAL | Status: DC | PRN

## 2014-07-24 MED ORDER — OXYCODONE HCL 5 MG/5ML PO SOLN
5.0000 mg | Freq: Once | ORAL | Status: DC | PRN
  Filled 2014-07-24: qty 5

## 2014-07-24 MED ORDER — DEXAMETHASONE SODIUM PHOSPHATE 10 MG/ML IJ SOLN
10.0000 mg | Freq: Once | INTRAMUSCULAR | Status: DC
  Filled 2014-07-24: qty 1

## 2014-07-24 MED ORDER — MIDAZOLAM HCL 2 MG/2ML IJ SOLN
INTRAMUSCULAR | Status: AC
  Filled 2014-07-24: qty 2

## 2014-07-24 MED ORDER — SODIUM CHLORIDE 0.9 % IV SOLN
100.0000 mL/h | INTRAVENOUS | Status: DC
  Administered 2014-07-24 – 2014-07-25 (×2): 100 mL/h via INTRAVENOUS
  Filled 2014-07-24 (×4): qty 1000

## 2014-07-24 MED ORDER — ATENOLOL 50 MG PO TABS
50.0000 mg | ORAL_TABLET | Freq: Two times a day (BID) | ORAL | Status: DC
Start: 2014-07-24 — End: 2014-07-25
  Administered 2014-07-24 – 2014-07-25 (×2): 50 mg via ORAL
  Filled 2014-07-24 (×3): qty 1

## 2014-07-24 MED ORDER — ONDANSETRON HCL 4 MG/2ML IJ SOLN
INTRAMUSCULAR | Status: AC
  Filled 2014-07-24: qty 2

## 2014-07-24 MED ORDER — METHOCARBAMOL 500 MG PO TABS
500.0000 mg | ORAL_TABLET | Freq: Four times a day (QID) | ORAL | Status: DC | PRN
  Administered 2014-07-25 (×2): 500 mg via ORAL
  Filled 2014-07-24 (×2): qty 1

## 2014-07-24 MED ORDER — CEFAZOLIN SODIUM-DEXTROSE 2-3 GM-% IV SOLR
INTRAVENOUS | Status: AC
  Filled 2014-07-24: qty 50

## 2014-07-24 MED ORDER — FERROUS SULFATE 325 (65 FE) MG PO TABS
325.0000 mg | ORAL_TABLET | Freq: Three times a day (TID) | ORAL | Status: DC
  Administered 2014-07-24 – 2014-07-25 (×3): 325 mg via ORAL
  Filled 2014-07-24 (×5): qty 1

## 2014-07-24 MED ORDER — FENTANYL CITRATE 0.05 MG/ML IJ SOLN
INTRAMUSCULAR | Status: DC | PRN
  Administered 2014-07-24: 50 ug via INTRAVENOUS

## 2014-07-24 MED ORDER — TRANEXAMIC ACID 100 MG/ML IV SOLN
1000.0000 mg | Freq: Once | INTRAVENOUS | Status: AC
  Administered 2014-07-24: 1000 mg via INTRAVENOUS
  Filled 2014-07-24: qty 10

## 2014-07-24 MED ORDER — DOCUSATE SODIUM 100 MG PO CAPS
100.0000 mg | ORAL_CAPSULE | Freq: Two times a day (BID) | ORAL | Status: DC
  Administered 2014-07-24 – 2014-07-25 (×2): 100 mg via ORAL

## 2014-07-24 MED ORDER — ONDANSETRON HCL 4 MG/2ML IJ SOLN: 4.0000 mg | Freq: Four times a day (QID) | INTRAMUSCULAR | Status: DC | PRN

## 2014-07-24 MED ORDER — ASPIRIN EC 325 MG PO TBEC
325.0000 mg | DELAYED_RELEASE_TABLET | Freq: Two times a day (BID) | ORAL | Status: DC
  Administered 2014-07-25: 325 mg via ORAL
  Filled 2014-07-24 (×3): qty 1

## 2014-07-24 SURGICAL SUPPLY — 61 items
BAG ZIPLOCK 12X15 (MISCELLANEOUS) ×3 IMPLANT
BANDAGE ELASTIC 6 VELCRO ST LF (GAUZE/BANDAGES/DRESSINGS) ×3 IMPLANT
BANDAGE ESMARK 6X9 LF (GAUZE/BANDAGES/DRESSINGS) ×1 IMPLANT
BLADE SAW SGTL 18X1.27X75 (BLADE) ×2 IMPLANT
BLADE SAW SGTL 18X1.27X75MM (BLADE) ×1
BNDG ESMARK 6X9 LF (GAUZE/BANDAGES/DRESSINGS) ×3
CAPT HIP PF MOP ×3 IMPLANT
CLOSURE WOUND 1/2 X4 (GAUZE/BANDAGES/DRESSINGS)
COVER MAYO STAND STRL (DRAPES) IMPLANT
CUFF TOURN SGL QUICK 34 (TOURNIQUET CUFF)
CUFF TRNQT CYL 34X4X40X1 (TOURNIQUET CUFF) IMPLANT
DERMABOND ADVANCED (GAUZE/BANDAGES/DRESSINGS) ×2
DERMABOND ADVANCED .7 DNX12 (GAUZE/BANDAGES/DRESSINGS) ×1 IMPLANT
DRAPE C-ARM 42X120 X-RAY (DRAPES) IMPLANT
DRAPE EXTREMITY TIBURON (DRAPES) IMPLANT
DRAPE INCISE IOBAN 85X60 (DRAPES) ×3 IMPLANT
DRAPE OEC MINIVIEW 54X84 (DRAPES) IMPLANT
DRAPE POUCH INSTRU U-SHP 10X18 (DRAPES) ×3 IMPLANT
DRAPE SPLIT 77X100IN (DRAPES) ×6 IMPLANT
DRAPE STERI IOBAN 125X83 (DRAPES) ×3 IMPLANT
DRAPE SURG 17X11 SM STRL (DRAPES) ×3 IMPLANT
DRAPE U-SHAPE 47X51 STRL (DRAPES) ×3 IMPLANT
DRSG AQUACEL AG ADV 3.5X10 (GAUZE/BANDAGES/DRESSINGS) ×3 IMPLANT
DRSG EMULSION OIL 3X16 NADH (GAUZE/BANDAGES/DRESSINGS) IMPLANT
DRSG PAD ABDOMINAL 8X10 ST (GAUZE/BANDAGES/DRESSINGS) IMPLANT
DRSG TEGADERM 4X4.75 (GAUZE/BANDAGES/DRESSINGS) ×3 IMPLANT
DURAPREP 26ML APPLICATOR (WOUND CARE) ×3 IMPLANT
ELECT BLADE TIP CTD 4 INCH (ELECTRODE) ×3 IMPLANT
ELECT REM PT RETURN 9FT ADLT (ELECTROSURGICAL) ×3
ELECTRODE REM PT RTRN 9FT ADLT (ELECTROSURGICAL) ×1 IMPLANT
FACESHIELD WRAPAROUND (MASK) ×12 IMPLANT
GAUZE SPONGE 2X2 8PLY STRL LF (GAUZE/BANDAGES/DRESSINGS) IMPLANT
GAUZE SPONGE 4X4 12PLY STRL (GAUZE/BANDAGES/DRESSINGS) ×3 IMPLANT
GLOVE BIOGEL PI IND STRL 7.5 (GLOVE) ×1 IMPLANT
GLOVE BIOGEL PI IND STRL 8.5 (GLOVE) ×1 IMPLANT
GLOVE BIOGEL PI INDICATOR 7.5 (GLOVE) ×2
GLOVE BIOGEL PI INDICATOR 8.5 (GLOVE) ×2
GLOVE ECLIPSE 8.0 STRL XLNG CF (GLOVE) ×3 IMPLANT
GLOVE ORTHO TXT STRL SZ7.5 (GLOVE) ×6 IMPLANT
GLOVE SURG ORTHO 8.0 STRL STRW (GLOVE) ×3 IMPLANT
GOWN SPEC L3 XXLG W/TWL (GOWN DISPOSABLE) ×6 IMPLANT
GOWN STRL REUS W/TWL LRG LVL3 (GOWN DISPOSABLE) ×3 IMPLANT
KIT BASIN OR (CUSTOM PROCEDURE TRAY) ×3 IMPLANT
LIQUID BAND (GAUZE/BANDAGES/DRESSINGS) ×3 IMPLANT
MANIFOLD NEPTUNE II (INSTRUMENTS) ×3 IMPLANT
NS IRRIG 1000ML POUR BTL (IV SOLUTION) ×3 IMPLANT
PACK TOTAL JOINT (CUSTOM PROCEDURE TRAY) ×3 IMPLANT
PADDING CAST COTTON 6X4 STRL (CAST SUPPLIES) IMPLANT
POSITIONER SURGICAL ARM (MISCELLANEOUS) ×6 IMPLANT
SPONGE GAUZE 2X2 STER 10/PKG (GAUZE/BANDAGES/DRESSINGS)
STAPLER VISISTAT 35W (STAPLE) IMPLANT
STRIP CLOSURE SKIN 1/2X4 (GAUZE/BANDAGES/DRESSINGS) IMPLANT
SUCTION FRAZIER TIP 10 FR DISP (SUCTIONS) ×3 IMPLANT
SUT MNCRL AB 4-0 PS2 18 (SUTURE) ×3 IMPLANT
SUT VIC AB 1 CT1 36 (SUTURE) ×9 IMPLANT
SUT VIC AB 2-0 CT1 27 (SUTURE) ×4
SUT VIC AB 2-0 CT1 TAPERPNT 27 (SUTURE) ×2 IMPLANT
SUT VLOC 180 0 24IN GS25 (SUTURE) ×6 IMPLANT
TOWEL OR 17X26 10 PK STRL BLUE (TOWEL DISPOSABLE) ×6 IMPLANT
TRAY FOLEY BAG SILVER LF 16FR (CATHETERS) ×3 IMPLANT
WATER STERILE IRR 1500ML POUR (IV SOLUTION) ×3 IMPLANT

## 2014-07-24 NOTE — Plan of Care (Signed)
Problem: Phase I Progression Outcomes Goal: CMS/Neurovascular status WDL Outcome: Completed/Met Date Met:  07/24/14 Goal: Pain controlled with appropriate interventions Outcome: Completed/Met Date Met:  07/24/14 Goal: Dangle or out of bed evening of surgery Outcome: Progressing Goal: Initial discharge plan identified Outcome: Completed/Met Date Met:  07/24/14 Goal: Hemodynamically stable Outcome: Completed/Met Date Met:  07/24/14

## 2014-07-24 NOTE — Anesthesia Procedure Notes (Signed)
Spinal  Start time: 07/24/2014 3:15 PM End time: 07/24/2014 3:21 PM Staffing Resident/CRNA: REARDON, DIANA L Performed by: resident/CRNA  Preanesthetic Checklist Completed: patient identified, timeout performed, IV checked and monitors and equipment checked Spinal Block Patient position: sitting Prep: Betadine Patient monitoring: continuous pulse ox and blood pressure Approach: midline Location: L3-4 Injection technique: single-shot Needle Needle type: Spinocan  Needle gauge: 22 G Needle length: 9 cm Assessment Sensory level: T6 Additional Notes Kit expiration checked 12/2015,  1 attempt with clear CSF, negative heme, negative parasthesia Tolerated well and returned to supine position   

## 2014-07-24 NOTE — Anesthesia Postprocedure Evaluation (Signed)
  Anesthesia Post-op Note  Patient: Hector [name]  Procedure(s) Performed: Procedure(s): CONVERSION OF PREVIOUS HIP SURGERY LEFT HIP ARTHROPLASTY (Left)  Patient Location: PACU  Anesthesia Type:Spinal  Level of Consciousness: awake and alert   Airway and Oxygen Therapy: Patient Spontanous Breathing and Patient connected to nasal cannula oxygen  Post-op Pain: none  Post-op Assessment: Post-op Vital signs reviewed, Patient's Cardiovascular Status Stable, Respiratory Function Stable, Patent Airway, No signs of Nausea or vomiting and Pain level controlled  Post-op Vital Signs: Reviewed and stable  Last Vitals:  Filed Vitals:   07/24/14 1815  BP:   Pulse:   Temp: 36.3 C  Resp:     Complications: No apparent anesthesia complications

## 2014-07-24 NOTE — Transfer of Care (Signed)
Immediate Anesthesia Transfer of Care Note  Patient: Hector [name]  Procedure(s) Performed: Procedure(s): CONVERSION OF PREVIOUS HIP SURGERY LEFT HIP ARTHROPLASTY (Left)  Patient Location: PACU  Anesthesia Type:MAC and Spinal  Level of Consciousness: Patient easily awoken, sedated, comfortable, cooperative, following commands, responds to stimulation.   Airway & Oxygen Therapy: Patient spontaneously breathing, ventilating well, oxygen via simple oxygen mask.  Post-op Assessment: Report given to PACU RN, vital signs reviewed and stable.   Post vital signs: Reviewed and stable.  Complications: No apparent anesthesia complications

## 2014-07-24 NOTE — Interval H&P Note (Signed)
History and Physical Interval Note:  07/24/2014 2:26 PM  Hector [name]  has presented today for surgery, with the diagnosis of left hip oa (post traumatic)   The various methods of treatment have been discussed with the patient and family. After consideration of risks, benefits and other options for treatment, the patient has consented to  Procedure(s): LEFT TOTAL HIP ARTHROPLASTY POSTERIOR APPROAH  (Left) POSSIBLE HARDWARE REMOVAL (Left) as a surgical intervention .  The patient's history has been reviewed, patient examined, no change in status, stable for surgery.  I have reviewed the patient's chart and labs.  Questions were answered to the patient's satisfaction.     Shelda PalLIN,Ichelle Harral D

## 2014-07-24 NOTE — Plan of Care (Signed)
Problem: Consults Goal: Total Joint Replacement Patient Education See Patient Education Module for education specifics. Outcome: Progressing Goal: Diagnosis- Total Joint Replacement Primary Total Hip Goal: Skin Care Protocol Initiated - if Braden Score 18 or less If consults are not indicated, leave blank or document N/A Outcome: Not Applicable Date Met:  16/55/37 Goal: Nutrition Consult-if indicated Outcome: Not Applicable Date Met:  48/27/07 Goal: Diabetes Guidelines if Diabetic/Glucose > 140 If diabetic or lab glucose is > 140 mg/dl - Initiate Diabetes/Hyperglycemia Guidelines & Document Interventions  Outcome: Not Applicable Date Met:  86/75/44  Problem: Phase I Progression Outcomes Goal: CMS/Neurovascular status WDL Outcome: Progressing Goal: Pain controlled with appropriate interventions Outcome: Progressing

## 2014-07-24 NOTE — Anesthesia Preprocedure Evaluation (Signed)
Anesthesia Evaluation    Airway Mallampati: III  TM Distance: >3 FB Neck ROM: Full    Dental  (+) Teeth Intact   Pulmonary neg sleep apnea, neg COPDformer smoker,  breath sounds clear to auscultation- rhonchi        Cardiovascular hypertension, Pt. on medications and Pt. on home beta blockers - angina- Past MI and - CHF Rhythm:Regular     Neuro/Psych  Neuromuscular disease negative psych ROS   GI/Hepatic negative GI ROS, Neg liver ROS,   Endo/Other  Morbid obesity  Renal/GU Renal disease     Musculoskeletal  (+) Arthritis -, Osteoarthritis,    Abdominal   Peds  Hematology negative hematology ROS (+)   Anesthesia Other Findings   Reproductive/Obstetrics                             Anesthesia Physical Anesthesia Plan  ASA: II  Anesthesia Plan: MAC and Spinal   Post-op Pain Management:    Induction: Intravenous  Airway Management Planned: Natural Airway and Simple Face Mask  Additional Equipment:   Intra-op Plan:   Post-operative Plan:   Informed Consent: I have reviewed the patients History and Physical, chart, labs and discussed the procedure including the risks, benefits and alternatives for the proposed anesthesia with the patient or authorized representative who has indicated his/her understanding and acceptance.   Dental advisory given  Plan Discussed with: CRNA and Surgeon  Anesthesia Plan Comments:         Anesthesia Quick Evaluation

## 2014-07-24 NOTE — Brief Op Note (Signed)
07/24/2014  5:04 PM  PATIENT:  Hector [name]  73 y.o. male  PRE-OPERATIVE DIAGNOSIS:  left hip post-traumatic osteoarthritis, status post previous left hip surgery ORIF of acetabular fracture    POST-OPERATIVE DIAGNOSIS:  left hip post-traumatic osteoarthritis, status post previous left hip surgery ORIF of acetabular fracture  PROCEDURE:  Procedure(s): CONVERSION OF PREVIOUS HIP SURGERY LEFT HIP ARTHROPLASTY (Left)  SURGEON:  Surgeon(s) and Role:    * Shelda PalMatthew D Vaughan Garfinkle, MD - Primary  PHYSICIAN ASSISTANT: Shelly CossMatthew Babish,PA-C  ANESTHESIA:   general  EBL:  Total I/O In: 1000 [I.V.:1000] Out: 700 [Urine:500; Blood:200]  BLOOD ADMINISTERED:none  DRAINS: none   LOCAL MEDICATIONS USED:  NONE  SPECIMEN:  No Specimen  DISPOSITION OF SPECIMEN:  N/A  COUNTS:  YES  TOURNIQUET:  * No tourniquets in log *  DICTATION: .Other Dictation: Dictation Number [id number]  PLAN OF CARE: Admit to inpatient   PATIENT DISPOSITION:  PACU - hemodynamically stable.   Delay start of Pharmacological VTE agent (>24hrs) due to surgical blood loss or risk of bleeding: no

## 2014-07-25 ENCOUNTER — Encounter (HOSPITAL_COMMUNITY): Payer: Self-pay | Admitting: Orthopedic Surgery

## 2014-07-25 LAB — BASIC METABOLIC PANEL
Anion gap: 13 (ref 5–15)
BUN: 19 mg/dL (ref 6–23)
CHLORIDE: 100 meq/L (ref 96–112)
CO2: 22 mEq/L (ref 19–32)
Calcium: 8.9 mg/dL (ref 8.4–10.5)
Creatinine, Ser: 1.05 mg/dL (ref 0.50–1.35)
GFR calc Af Amer: 80 mL/min — ABNORMAL LOW (ref >90)
GFR calc non Af Amer: 69 mL/min — ABNORMAL LOW (ref >90)
GLUCOSE: 168 mg/dL — AB (ref 70–99)
POTASSIUM: 4.5 meq/L (ref 3.7–5.3)
Sodium: 135 mEq/L — ABNORMAL LOW (ref 137–147)

## 2014-07-25 LAB — CBC
HCT: 38.9 % — ABNORMAL LOW (ref 39.0–52.0)
HEMOGLOBIN: 13.6 g/dL (ref 13.0–17.0)
MCH: 31.1 pg (ref 26.0–34.0)
MCHC: 35 g/dL (ref 30.0–36.0)
MCV: 88.8 fL (ref 78.0–100.0)
Platelets: 241 10*3/uL (ref 150–400)
RBC: 4.38 MIL/uL (ref 4.22–5.81)
RDW: 13.7 % (ref 11.5–15.5)
WBC: 15.3 10*3/uL — AB (ref 4.0–10.5)

## 2014-07-25 MED ORDER — HYDROCODONE-ACETAMINOPHEN 7.5-325 MG PO TABS
1.0000 | ORAL_TABLET | ORAL | Status: DC | PRN
Start: 2014-07-25 — End: 2014-10-23

## 2014-07-25 MED ORDER — DSS 100 MG PO CAPS: 100.0000 mg | ORAL_CAPSULE | Freq: Two times a day (BID) | ORAL | Status: DC

## 2014-07-25 MED ORDER — TIZANIDINE HCL 4 MG PO TABS: 4.0000 mg | ORAL_TABLET | Freq: Four times a day (QID) | ORAL | Status: DC | PRN

## 2014-07-25 MED ORDER — ASPIRIN 325 MG PO TBEC: 325.0000 mg | DELAYED_RELEASE_TABLET | Freq: Two times a day (BID) | ORAL | Status: AC

## 2014-07-25 MED ORDER — POLYETHYLENE GLYCOL 3350 17 G PO PACK: 17.0000 g | PACK | Freq: Two times a day (BID) | ORAL | Status: DC

## 2014-07-25 MED ORDER — FERROUS SULFATE 325 (65 FE) MG PO TABS: 325.0000 mg | ORAL_TABLET | Freq: Three times a day (TID) | ORAL | Status: DC

## 2014-07-25 NOTE — Progress Notes (Signed)
Utilization review completed.  

## 2014-07-25 NOTE — Op Note (Signed)
NAMSuann Larry:  [name], Hector              ACCOUNT NO.:  [id number]  MEDICAL RECORD NO.:  [id number]  LOCATION:  1609                         [facility]  PHYSICIAN:  Madlyn FrankelMatthew D. Charlann Boxerlin, M.D.  DATE OF BIRTH:  1941/05/26  DATE OF PROCEDURE:  07/24/2014 DATE OF DISCHARGE:                              OPERATIVE REPORT   PREOPERATIVE DIAGNOSES:  Left hip posttraumatic arthritis with previous left hip surgery, open reduction and internal fixation of the left acetabular fracture.  POSTOPERATIVE DIAGNOSES:  Left hip posttraumatic arthritis with previous left hip surgery, open reduction and internal fixation of the left acetabular fracture.  PROCEDURE:  Conversion of previous left hip surgery to a left total hip arthroplasty.  Removing 2 deep screws from the acetabulum.  COMPONENTS USED:  DePuy hip system with a size 62 Gription Pinnacle cup 36+ 4 neutral AltrX liner, and a size 10 high Tri-Lock stem with a 36+ 1.5.  Articul/eze metal head ball.  SURGEON:  Madlyn FrankelMatthew D. Charlann Boxerlin, M.D.  ASSISTANT:  Lanney GinsMatthew Babish, PA-C.  Note that, Hector [name] was present for the entire case from preoperative positioning, perioperative management of the upper extremity, general facilitation of the case from primary wound closure.  ANESTHESIA:  General.  SPECIMENS:  None.  COMPLICATION:  None.  BLOOD LOSS:  About 200 mL.  DRAINS:  None.  INDICATIONS FOR PROCEDURE:  Hector [name] is a 73 year old male who was referred for surgical consideration of progressive left hip joint pain following previous open reduction and internal fixation.  He had retained hardware and obvious development of advanced degenerative changes, complete loss of joint space and periarticular cystic changes on both femoral and acetabular sides.  The benefits of pain relief from total hip arthroplasty were discussed in the setting of risks of infection, DVT, component failure, need for future surgery in addition, neurovascular injury, based  on the dissection for hardware removal from previous acetabular plating.  Consent was obtained for benefit of pain relief.  PROCEDURE IN DETAIL:  The patient was brought to the operative theater. Once adequate anesthesia, preoperative antibiotics, Ancef administered, he was positioned into the right lateral decubitus position with the left side up.  The left lower extremity was then prepped and draped in sterile fashion.  A time-out was performed identifying the patient, planned procedure, and extremity.  His old incision was identified and a portion utilized and extended slightly proximal for exposure of the hip joint.  Soft tissue dissection was carried down to the iliotibial band and gluteal fascia which was then split for posterior approach to the hip.  The short external rotators and posterior capsule taken down as separate layer with retractors placed.  Following exposure of the hip joint down to the posterior wall of the acetabulum, the hip was then dislocated and femoral neck osteotomy made into the trochanteric fossa.  The femur was then flexed and internally rotated and retractors placed. Proximal femur was exposed.  Proximal femur was entered with a drill, hand reamed once and irrigated to try to prevent fat emboli.  I then began broaching with the starting broach setting anteversion the femur 20-25 degrees.  I broached up with this orientation up to a size 10 with  good medial and lateral metaphyseal fit.  Following this, broaching of the femur was packed with a sponge and then retracted anteriorly for acetabular exposure.  Once the acetabulum was exposed noting healed acetabulum posteriorly and some bony overgrowth, I began reaming with a 51 reamer and then reamed all the way up to initially to 59-mm reamer, we encountered our first screw.  Given the identification of the screw, I carefully dissected the posterior soft tissues off of the acetabulum.  This screw was  identified and removed. Based on necessity for continued reaming, I reamed up to 61 mm where we identified a second screw proof and then again carefully dissected the posterior soft tissues identifying the previously placed plate and this second screw which is posterior to the plate.  This screw was removed without difficulty.  At this point, we identified significant acetabular cystic changes which were debrided with a curette and the pituitary rongeur.  I then reamed the patient's acetabular head creating an autograft which was placed into the acetabulum and reversed reamed to facilitate these voids.  A 62-mm Gription pinnacle shell was then opened and impacted at about 35- 40 degrees of abduction, 20 degrees of forward flexion, with a palpable anterior wall present.  Given these findings, I placed 2 cancellous screws into the ilium, irrigated the acetabulum and placed a 36+ 4 neutral AltrX liner.  At this point, attention was directed to a trial reduction.  The 10 broach was re-impacted into the proximal femur.  A trial reduction was carried out with a high offset neck and 36, +1.5 ball.  With this combination, I found that we had a combined anteversion of at least 45-50 degrees.  There was no evidence any subluxation with flexion and internal rotation, as well as with extension and external rotation.  Leg lengths appeared to be comfortable distally.  Given these findings, all the trial components were removed.  The final 10 high Tri- Lock stem was opened and following irrigation, the canal impacted to the level where the broach was.  Based on this and a repeat trial reduction, I selected a 36+ 1.5, Articul/eze metal head ball which was impacted on the clean and dry trunnion.  The hip was reduced.  I reassessed stability of the hip and I was satisfied with the range of motion without impingement.  At this point, the hip was reduced.  The posterior capsular soft tissues were  reapproximated to 1 another using #1 Vicryl. The iliotibial band and gluteal fascia were then reapproximated using a combination of #1 Vicryl and 0 V-Loc suture.  The remaining wound was closed in layers with 2-0 Vicryl and running 4-0 Monocryl.  The hip was then cleaned, dried, and dressed sterilely using Dermabond and Aquacel dressing.  He was then extubated and brought to the recovery room in stable condition tolerating the procedure well.  His 2 screws were removed, were saved and cleaned for him as he wanted his hardware removed.  Findings reviewed with family. He will be admitted to the hospital, be weightbearing as tolerated. Discharge pending his recovery within assessments to therapy.     Madlyn FrankelMatthew D. Charlann Boxerlin, M.D.     MDO/MEDQ  D:  07/24/2014  T:  07/25/2014  Job:  [id number]

## 2014-07-25 NOTE — Care Management Note (Signed)
    Page 1 of 2   07/25/2014     11:11:33 AM CARE MANAGEMENT NOTE 07/25/2014  Patient:  Hector [name], Hector [name]   Account Number:  [id number]  Date Initiated:  07/25/2014  Documentation initiated by:  [facility]  Subjective/Objective Assessment:   adm: open reduction and internal fixation of the left  acetabular fracture     Action/Plan:   discharge planning   Anticipated DC Date:  07/25/2014   Anticipated DC Plan:  [place]  CM consult      [facility] Choice  HOME HEALTH   Choice offered to / List presented to:  C-1 Patient   DME arranged  NA      DME agency  NA     [place] arranged  HH-2 PT      Status of service:  Completed, signed off Medicare Important Message given?   (If response is "NO", the following Medicare IM given date fields will be blank) Date Medicare IM given:   Medicare IM given by:   Date Additional Medicare IM given:   Additional Medicare IM given by:    Discharge Disposition:  [name]  Per UR Regulation:    If discussed at Long Length of Stay Meetings, dates discussed:    Comments:  07/25/14 10:15 CM met with pt in room to offer choice; pt chose Hector [name] but unfortunately, [name] will not accept. Referral called to [facility] rep, Hector [name].  Pt has both a rolling walker and elevated toilet seat (and does not want a 3n1) at home.  address and contact information verified with pt. No other CM needs were communicated.

## 2014-07-25 NOTE — Progress Notes (Signed)
RN reviewed discharge instructions with patient and wife. All questions answered.  Paperwork and prescriptions given.   RN rolled patient down in wheelchair to family car.

## 2014-07-25 NOTE — Evaluation (Signed)
Occupational Therapy Evaluation Patient Details Name: Hector [name] MRN: [id number] DOB: 1941/09/11 Today's Date: 07/25/2014    History of Present Illness Pt is a 73 year old male s/p conversion of previous left hip surgery to a left total hip arthroplasty.   Clinical Impression   Pt was admitted for the above.  Pt states that he followed THPs after previous surgery (ORIF).  Reviewed these for ADLs applications as well as AE.  Wife will assist as needed.  They have no further questions.      Follow Up Recommendations  No OT follow up    Equipment Recommendations  None recommended by OT    Recommendations for Other Services       Precautions / Restrictions Precautions Precautions: Posterior Hip Precaution Comments: reviewed posterior hip precautions Restrictions Other Position/Activity Restrictions: WBAT      Mobility Bed Mobility                not tested  Transfers                  min guard, sit to stand    Balance                                            ADL Overall ADL's : Needs assistance/impaired             Lower Body Bathing: Min guard;Sit to/from stand;With adaptive equipment       Lower Body Dressing: Min guard;With adaptive equipment;Sit to/from stand                 General ADL Comments: reviewed precautions and ADLs.  Pt has AE at home, but he hasn't used this in 7 years.  Practiced with sock aide with set up.  reviewed shower transfer, but pt doesn't feel he needs to practice.  Pt/wife  verbalize understanding of all education/precautions.       Vision                     Perception     Praxis      Pertinent Vitals/Pain Pain Assessment: 0-10 Pain Score: 2  Pain Location: L hip Pain Descriptors / Indicators: Sore Pain Intervention(s): Limited activity within patient's tolerance;Monitored during session     Hand Dominance     Extremity/Trunk Assessment Upper Extremity  Assessment Upper Extremity Assessment: Overall WFL for tasks assessed          Communication Communication Communication: HOH   Cognition Arousal/Alertness: Awake/alert Behavior During Therapy: WFL for tasks assessed/performed Overall Cognitive Status: Within Functional Limits for tasks assessed                     General Comments       Exercises       Shoulder Instructions      Home Living Family/patient expects to be discharged to:: Private residence Living Arrangements: Spouse/significant other   Type of Home: House Home Access: Stairs to enter Secretary/administratorntrance Stairs-Number of Steps: 1 Entrance Stairs-Rails: None Home Layout: One level     Bathroom Shower/Tub: Producer, television/film/videoWalk-in shower   Bathroom Toilet: Standard     Home Equipment: Environmental consultantWalker - 2 wheels;Bedside commode;Shower seat          Prior Functioning/Environment Level of Independence: Independent             OT  Diagnosis: Generalized weakness;Acute pain   OT Problem List:     OT Treatment/Interventions:      OT Goals(Current goals can be found in the care plan section)    OT Frequency:     Barriers to D/C:            Co-evaluation              End of Session    Activity Tolerance: Patient tolerated treatment well Patient left: in chair;with call bell/phone within reach;with family/visitor present   Time: 1610-96041032-1056 OT Time Calculation (min): 24 min Charges:  OT General Charges $OT Visit: 1 Procedure OT Evaluation $Initial OT Evaluation Tier I: 1 Procedure OT Treatments $Self Care/Home Management : 8-22 mins G-Codes:    Hector [name] 07/25/2014, 11:03 AM Hector [name], OTR/L [phone] 07/25/2014

## 2014-07-25 NOTE — Progress Notes (Signed)
Patient ID: Hector [name], male   DOB: May 18, 1941, 73 y.o.   MRN: [id number] Subjective: 1 Day Post-Op Procedure(s) (LRB): CONVERSION OF PREVIOUS HIP SURGERY LEFT HIP ARTHROPLASTY (Left)    Patient reports pain as mild to moderate soreness in hip area, no events or complications  Objective:   VITALS:   Filed Vitals:   07/25/14 1024  BP:   Pulse: 55  Temp:   Resp:     Neurovascular intact Incision: dressing C/D/I  LABS  Recent Labs  07/25/14 0410  HGB 13.6  HCT 38.9*  WBC 15.3*  PLT 241     Recent Labs  07/25/14 0410  NA 135*  K 4.5  BUN 19  CREATININE 1.05  GLUCOSE 168*    No results for input(s): LABPT, INR in the last 72 hours.   Assessment/Plan: 1 Day Post-Op Procedure(s) (LRB): CONVERSION OF PREVIOUS HIP SURGERY LEFT HIP ARTHROPLASTY (Left)   Advance diet Up with therapy Discharge home with home health today after pm therapy  RTC in 2 weeks

## 2014-07-25 NOTE — Progress Notes (Signed)
Physical Therapy Treatment Note    07/25/14 1400  PT Visit Information  Last PT Received On 07/25/14  Assistance Needed +1  History of Present Illness Pt is a 73 year old male s/p conversion of previous left hip surgery to a left total hip arthroplasty.  PT Time Calculation  PT Start Time 1333  PT Stop Time 1345  PT Time Calculation (min) 12 min  Subjective Data  Subjective Pt ambulated in hallway again and practiced one step.  Reviewed posterior hip precautions and provided handout for THP and HEP.  Pt and spouse had no further questions.  Pt feels ready for d/c home today.  Precautions  Precautions Posterior Hip  Precaution Comments reviewed and provided handout  Restrictions  Other Position/Activity Restrictions WBAT  Pain Assessment  Pain Assessment 0-10  Pain Score 2  Pain Location L hip  Pain Descriptors / Indicators Sore  Pain Intervention(s) Limited activity within patient's tolerance;Monitored during session  Cognition  Arousal/Alertness Awake/alert  Behavior During Therapy WFL for tasks assessed/performed  Overall Cognitive Status Within Functional Limits for tasks assessed  Bed Mobility  General bed mobility comments pt sitting EOB upon arrival and departure  Transfers  Overall transfer level Needs assistance  Equipment used Rolling walker (2 wheeled)  Transfers Sit to/from Stand  Sit to Stand Supervision  General transfer comment one cue for L LE forward  Ambulation/Gait  Ambulation/Gait assistance Supervision  Ambulation Distance (Feet) 260 Feet  Assistive device Rolling walker (2 wheeled)  Gait Pattern/deviations Step-through pattern;Decreased step length - right;Decreased stance time - left;Antalgic  General Gait Details verbal cues for step length and RW distance, pt able to recall turning toward unaffected LE  Stairs Yes  Stairs assistance Min guard  Stair Management Step to pattern;Forwards;With walker  Number of Stairs 1  General stair comments verbal  cues for safety, sequence, technique  PT - End of Session  Activity Tolerance Patient tolerated treatment well  Patient left in bed;with call bell/phone within reach;with family/visitor present  PT - Assessment/Plan  PT Plan Current plan remains appropriate  PT Frequency 7X/week  Follow Up Recommendations Home health PT  PT equipment None recommended by PT  PT Goal Progression  Progress towards PT goals Progressing toward goals  PT General Charges  $$ ACUTE PT VISIT 1 Procedure  PT Treatments  $Gait Training 8-22 mins   Zenovia JarredKati Twanda Stakes, PT, DPT 07/25/2014 Pager: [phone]

## 2014-07-25 NOTE — Evaluation (Signed)
Physical Therapy Evaluation Patient Details Name: Hector FurryHerbert F [name] MRN: [id number] DOB: [birth year] Today's Date: 07/25/2014   History of Present Illness  Pt is a 73 year old male s/p conversion of previous left hip surgery to a left total hip arthroplasty.  Clinical Impression  Patient is s/p conversion of previous left hip surgery to L THA resulting in functional limitations due to the deficits listed below (see PT Problem List).  Patient will benefit from skilled PT to increase their independence and safety with mobility to allow discharge to the venue listed below.  Pt educated on posterior hip precautions.  Pt ambulating good distance in hallway and performed LE exercises.  Pt with possible d/c home later today.      Follow Up Recommendations Home health PT    Equipment Recommendations  None recommended by PT    Recommendations for Other Services       Precautions / Restrictions Precautions Precautions: Posterior Hip Precaution Comments: reviewed posterior hip precautions Restrictions Other Position/Activity Restrictions: WBAT      Mobility  Bed Mobility Overal bed mobility: Needs Assistance Bed Mobility: Supine to Sit     Supine to sit: Supervision     General bed mobility comments: verbal cues for hip precautions  Transfers Overall transfer level: Needs assistance Equipment used: Rolling walker (2 wheeled) Transfers: Sit to/from Stand Sit to Stand: Min guard         General transfer comment: verbal cues for UE and LE positioning  Ambulation/Gait Ambulation/Gait assistance: Min guard Ambulation Distance (Feet): 200 Feet Assistive device: Rolling walker (2 wheeled) Gait Pattern/deviations: Step-to pattern;Step-through pattern;Antalgic     General Gait Details: verbal cues for sequence, RW distance, step length, posture, improved to step through pattern, cues to turn toward unaffected LE for hip precaution  Stairs            Wheelchair Mobility     Modified Rankin (Stroke Patients Only)       Balance                                             Pertinent Vitals/Pain Pain Assessment: 0-10 Pain Score: 2  Pain Location: L hip Pain Descriptors / Indicators: Sore Pain Intervention(s): Limited activity within patient's tolerance;Monitored during session    Home Living Family/patient expects to be discharged to:: Private residence Living Arrangements: Spouse/significant other   Type of Home: House Home Access: Stairs to enter Entrance Stairs-Rails: None Entrance Stairs-Number of Steps: 1 Home Layout: One level Home Equipment: Environmental consultantWalker - 2 wheels;Bedside commode;Shower seat      Prior Function Level of Independence: Independent               Hand Dominance        Extremity/Trunk Assessment   Upper Extremity Assessment: Overall WFL for tasks assessed           Lower Extremity Assessment: LLE deficits/detail   LLE Deficits / Details: decreased functional hip strength     Communication   Communication: HOH  Cognition Arousal/Alertness: Awake/alert Behavior During Therapy: WFL for tasks assessed/performed Overall Cognitive Status: Within Functional Limits for tasks assessed                      General Comments      Exercises Total Joint Exercises Ankle Circles/Pumps: AROM;Both;15 reps Quad Sets: AROM;Left;15 reps Short Arc Quad: AROM;Left;15  reps Heel Slides: AAROM;Left;15 reps (within precautions) Hip ABduction/ADduction: AROM;Left;10 reps      Assessment/Plan    PT Assessment Patient needs continued PT services  PT Diagnosis Difficulty walking   PT Problem List Decreased strength;Decreased mobility;Decreased knowledge of use of DME;Pain  PT Treatment Interventions Gait training;DME instruction;Stair training;Functional mobility training;Patient/family education;Therapeutic activities;Therapeutic exercise   PT Goals (Current goals can be found in the Care Plan  section) Acute Rehab PT Goals PT Goal Formulation: With patient Time For Goal Achievement: 07/28/14 Potential to Achieve Goals: Good    Frequency 7X/week   Barriers to discharge        Co-evaluation               End of Session   Activity Tolerance: Patient tolerated treatment well Patient left: in chair;with call bell/phone within reach;with family/visitor present           Time: [id number] PT Time Calculation (min): 33 min   Charges:   PT Evaluation $Initial PT Evaluation Tier I: 1 Procedure PT Treatments $Gait Training: 8-22 mins $Therapeutic Exercise: 8-22 mins   PT G Codes:          Recardo Linn,KATHrine E 07/25/2014, 12:17 PM Zenovia JarredKati Kally Cadden, PT, DPT 07/25/2014 Pager: [phone]

## 2014-08-01 NOTE — Discharge Summary (Signed)
Physician Discharge Summary  Patient ID: Hector [name] MRN: [id number] DOB/AGE: [birth year] 73 y.o.  Admit date: 07/24/2014 Discharge date: 07/25/2014   Procedures:  Procedure(s) (LRB): CONVERSION OF PREVIOUS HIP SURGERY LEFT HIP ARTHROPLASTY (Left)  Attending Physician:  Dr. Durene RomansMatthew Olin   Admission Diagnoses:   Left hip post-traumatic OA / pain  Discharge Diagnoses:  Active Problems:   S/P hip replacement  Past Medical History  Diagnosis Date  . Hypertension   . Hyperlipidemia   . Obesity   . ED (erectile dysfunction)   . IFG (impaired fasting glucose)   . Noncompliance   . Chronic hip pain     LEFT HIP  . Arthritis     PAIN AND OA LEFT HIP; ARTHRITIS BOTH KNEES    HPI: Hector [name], 73 y.o. male, has a history of pain and functional disability in the left hip(s) due to trauma and arthritis and patient has failed non-surgical conservative treatments for greater than 12 weeks to include NSAID's and/or analgesics, use of assistive devices and activity modification. Onset of symptoms was abrupt starting 6 years ago with gradually worsening course since that time.The patient noted prior procedures of the hip to include ORIF on the left hip(s). Patient currently rates pain in the left hip at 9 out of 10 with activity. Patient has night pain, worsening of pain with activity and weight bearing, trendelenberg gait, pain that interfers with activities of daily living and pain with passive range of motion. Patient has evidence of periarticular osteophytes, joint space narrowing and hardware from previous surgery by imaging studies. This condition presents safety issues increasing the risk of falls. There is no current active infection. Risks, benefits and expectations were discussed with the patient. Risks including but not limited to the risk of anesthesia, blood clots, nerve damage, blood vessel damage, failure of the prosthesis, infection and up to and including death.  Patient understand the risks, benefits and expectations and wishes to proceed with surgery.  PCP: Harlow AsaLUKING,W S, MD   Discharged Condition: good  Hospital Course:  Patient underwent the above stated procedure on 07/24/2014. Patient tolerated the procedure well and brought to the recovery room in good condition and subsequently to the floor.  POD #1 Patient reports pain as mild to moderate soreness in hip area, no events or complications. Ready to be discharged home. Dorsiflexion/plantar flexion intact, incision: dressing C/D/I, no cellulitis present and compartment soft.   LABS  Basename    HGB  13.6  HCT  38.9    Discharge Exam: General appearance: alert, cooperative and no distress Extremities: Homans sign is negative, no sign of DVT, no edema, redness or tenderness in the calves or thighs and no ulcers, gangrene or trophic changes  Disposition: Home with follow up in 2 weeks   Follow-up Information    Follow up with [facility].   Why:  home health physical therapy   Contact information:   [address] [place] [address] [phone]       Follow up with Shelda PalLIN,Blondina Coderre D, MD. Schedule an appointment as soon as possible for a visit in 2 weeks.   Specialty:  Orthopedic Surgery   Contact information:   [address] [place] [address] [phone]       Discharge Instructions    Call MD / Call 911    Complete by:  As directed   If you experience chest pain or shortness of breath, CALL 911 and be transported to  the hospital emergency room.  If you develope a fever above 101 F, pus (white drainage) or increased drainage or redness at the wound, or calf pain, call your surgeon's office.     Change dressing    Complete by:  As directed   Maintain surgical dressing for 10-14 days, or until follow up in the clinic.     Constipation Prevention    Complete by:  As directed   Drink plenty of fluids.  Prune juice may be helpful.  You  may use a stool softener, such as Colace (over the counter) 100 mg twice a day.  Use MiraLax (over the counter) for constipation as needed.     Diet - low sodium heart healthy    Complete by:  As directed      Discharge instructions    Complete by:  As directed   Maintain surgical dressing for 10-14 days, or until follow up in the clinic. [facility]. Call with any questions or concerns.     Increase activity slowly as tolerated    Complete by:  As directed      TED hose    Complete by:  As directed   Use stockings (TED hose) for 2 weeks on both leg(s).  You may remove them at night for sleeping.     Weight bearing as tolerated    Complete by:  As directed   Laterality:  left  Extremity:  Lower             Medication List    STOP taking these medications        ALEVE 220 MG Caps  Generic drug:  Naproxen Sodium      TAKE these medications        amLODipine 10 MG tablet  Commonly known as:  NORVASC  Take 10 mg by mouth every morning.     aspirin 325 MG EC tablet  Take 1 tablet (325 mg total) by mouth 2 (two) times daily.     atenolol 50 MG tablet  Commonly known as:  TENORMIN  Take 1 tablet (50 mg total) by mouth 2 (two) times daily.     benazepril 40 MG tablet  Commonly known as:  LOTENSIN  Take 1 tablet (40 mg total) by mouth 2 (two) times daily.     DSS 100 MG Caps  Take 100 mg by mouth 2 (two) times daily.     ferrous sulfate 325 (65 FE) MG tablet  Take 1 tablet (325 mg total) by mouth 3 (three) times daily after meals.     FISH OIL BURP-LESS 1200 MG Caps  Take 3 capsules by mouth every morning.     gemfibrozil 600 MG tablet  Commonly known as:  LOPID  Take 1 tablet (600 mg total) by mouth 2 (two) times daily before a meal.     HYDROcodone-acetaminophen 7.5-325 MG per tablet  Commonly known as:  NORCO  Take 1-2 tablets by mouth every 4 (four) hours as needed for moderate pain.     polyethylene glycol packet    Commonly known as:  MIRALAX / GLYCOLAX  Take 17 g by mouth 2 (two) times daily.     tiZANidine 4 MG tablet  Commonly known as:  ZANAFLEX  Take 1 tablet (4 mg total) by mouth every 6 (six) hours as needed for muscle spasms.         Signed: Anastasio AuerbachMatthew S. Charlet Harr   PA-C  08/01/2014,  11:18 PM

## 2014-08-25 ENCOUNTER — Encounter: Payer: Self-pay | Admitting: Family Medicine

## 2014-08-25 ENCOUNTER — Ambulatory Visit (INDEPENDENT_AMBULATORY_CARE_PROVIDER_SITE_OTHER): Payer: Commercial Managed Care - HMO | Admitting: Family Medicine

## 2014-08-25 VITALS — BP 152/92 | Ht 71.25 in | Wt 234.0 lb

## 2014-08-25 DIAGNOSIS — M79671 Pain in right foot: Secondary | ICD-10-CM

## 2014-08-25 DIAGNOSIS — M7741 Metatarsalgia, right foot: Secondary | ICD-10-CM

## 2014-08-25 MED ORDER — ETODOLAC 400 MG PO TABS: 400.0000 mg | ORAL_TABLET | Freq: Two times a day (BID) | ORAL | Status: DC

## 2014-08-25 NOTE — Progress Notes (Signed)
   Subjective:    Patient ID: Hector [name], male    DOB: May 27, 1941, 73 y.o.   MRN: [id number]  Foot Pain This is a new (right foot) problem. The current episode started in the past 7 days. The problem occurs constantly. The problem has been gradually worsening. Associated symptoms comments: Had left hip surgery a month ago. The symptoms are aggravated by standing and walking. He has tried oral narcotics, ice and immobilization for the symptoms. The treatment provided mild relief.   Patient is one month status post left hip surgery. Using walker.  Overall has recovered relatively well.  Right mid foot pain with redness tenderness and swelling. Recalls no acute injury. It is walking awkwardly with walker.   Review of Systems No knee pain no chest pain    Objective:   Physical Exam  Alert no acute distress lungs clear heart rare rhythm right mid foot erythematous tender swollen ankle good range of motion pulses good no metatarsal phalangeal tenderness no fasciitis      Assessment & Plan:  Midfoot metatarsalgia with inflammation plan Lodine 400 twice a day with food. X-ray of persists. Highly doubt infection. doubt gout. X-ray to rule out stress fracture if persists.

## 2014-09-12 ENCOUNTER — Other Ambulatory Visit: Payer: Self-pay | Admitting: Family Medicine

## 2014-09-20 ENCOUNTER — Encounter: Payer: Self-pay | Admitting: Internal Medicine

## 2014-09-22 ENCOUNTER — Emergency Department (HOSPITAL_COMMUNITY): Payer: Commercial Managed Care - HMO

## 2014-09-22 ENCOUNTER — Encounter (HOSPITAL_COMMUNITY): Payer: Self-pay | Admitting: Emergency Medicine

## 2014-09-22 ENCOUNTER — Emergency Department (HOSPITAL_COMMUNITY)
Admission: EM | Admit: 2014-09-22 | Discharge: 2014-09-23 | Disposition: A | Discharge status: Home / Self Care | Payer: Commercial Managed Care - HMO | Attending: Emergency Medicine | Admitting: Emergency Medicine

## 2014-09-22 DIAGNOSIS — Z9119 Patient's noncompliance with other medical treatment and regimen: Secondary | ICD-10-CM | POA: Diagnosis not present

## 2014-09-22 DIAGNOSIS — G8929 Other chronic pain: Secondary | ICD-10-CM | POA: Insufficient documentation

## 2014-09-22 DIAGNOSIS — X58XXXA Exposure to other specified factors, initial encounter: Secondary | ICD-10-CM | POA: Insufficient documentation

## 2014-09-22 DIAGNOSIS — Z7982 Long term (current) use of aspirin: Secondary | ICD-10-CM | POA: Insufficient documentation

## 2014-09-22 DIAGNOSIS — E785 Hyperlipidemia, unspecified: Secondary | ICD-10-CM | POA: Insufficient documentation

## 2014-09-22 DIAGNOSIS — S79912A Unspecified injury of left hip, initial encounter: Secondary | ICD-10-CM | POA: Diagnosis present

## 2014-09-22 DIAGNOSIS — Y9301 Activity, walking, marching and hiking: Secondary | ICD-10-CM | POA: Diagnosis not present

## 2014-09-22 DIAGNOSIS — Z87448 Personal history of other diseases of urinary system: Secondary | ICD-10-CM | POA: Insufficient documentation

## 2014-09-22 DIAGNOSIS — Z96649 Presence of unspecified artificial hip joint: Secondary | ICD-10-CM

## 2014-09-22 DIAGNOSIS — Y9289 Other specified places as the place of occurrence of the external cause: Secondary | ICD-10-CM | POA: Diagnosis not present

## 2014-09-22 DIAGNOSIS — Y831 Surgical operation with implant of artificial internal device as the cause of abnormal reaction of the patient, or of later complication, without mention of misadventure at the time of the procedure: Secondary | ICD-10-CM | POA: Insufficient documentation

## 2014-09-22 DIAGNOSIS — T84091D Other mechanical complication of internal left hip prosthesis, subsequent encounter: Secondary | ICD-10-CM | POA: Diagnosis not present

## 2014-09-22 DIAGNOSIS — T84091A Other mechanical complication of internal left hip prosthesis, initial encounter: Secondary | ICD-10-CM | POA: Diagnosis not present

## 2014-09-22 DIAGNOSIS — T84021A Dislocation of internal left hip prosthesis, initial encounter: Secondary | ICD-10-CM | POA: Diagnosis not present

## 2014-09-22 DIAGNOSIS — I10 Essential (primary) hypertension: Secondary | ICD-10-CM | POA: Diagnosis not present

## 2014-09-22 DIAGNOSIS — T84029A Dislocation of unspecified internal joint prosthesis, initial encounter: Secondary | ICD-10-CM

## 2014-09-22 DIAGNOSIS — Z87891 Personal history of nicotine dependence: Secondary | ICD-10-CM | POA: Insufficient documentation

## 2014-09-22 DIAGNOSIS — M1389 Other specified arthritis, multiple sites: Secondary | ICD-10-CM | POA: Diagnosis not present

## 2014-09-22 DIAGNOSIS — Y998 Other external cause status: Secondary | ICD-10-CM | POA: Insufficient documentation

## 2014-09-22 DIAGNOSIS — E669 Obesity, unspecified: Secondary | ICD-10-CM | POA: Diagnosis not present

## 2014-09-22 DIAGNOSIS — Z79899 Other long term (current) drug therapy: Secondary | ICD-10-CM | POA: Diagnosis not present

## 2014-09-22 DIAGNOSIS — R52 Pain, unspecified: Secondary | ICD-10-CM | POA: Diagnosis not present

## 2014-09-22 DIAGNOSIS — M25559 Pain in unspecified hip: Secondary | ICD-10-CM

## 2014-09-22 DIAGNOSIS — M25552 Pain in left hip: Secondary | ICD-10-CM | POA: Diagnosis not present

## 2014-09-22 LAB — CBC
HCT: 43.8 % (ref 39.0–52.0)
Hemoglobin: 14.7 g/dL (ref 13.0–17.0)
MCH: 30.2 pg (ref 26.0–34.0)
MCHC: 33.6 g/dL (ref 30.0–36.0)
MCV: 89.9 fL (ref 78.0–100.0)
PLATELETS: 242 10*3/uL (ref 150–400)
RBC: 4.87 MIL/uL (ref 4.22–5.81)
RDW: 14 % (ref 11.5–15.5)
WBC: 12.5 10*3/uL — ABNORMAL HIGH (ref 4.0–10.5)

## 2014-09-22 LAB — BASIC METABOLIC PANEL
Anion gap: 8 (ref 5–15)
BUN: 23 mg/dL (ref 6–23)
CO2: 27 mmol/L (ref 19–32)
Calcium: 10.2 mg/dL (ref 8.4–10.5)
Chloride: 103 mEq/L (ref 96–112)
Creatinine, Ser: 1.05 mg/dL (ref 0.50–1.35)
GFR calc Af Amer: 80 mL/min — ABNORMAL LOW (ref >90)
GFR calc non Af Amer: 69 mL/min — ABNORMAL LOW (ref >90)
Glucose, Bld: 120 mg/dL — ABNORMAL HIGH (ref 70–99)
Potassium: 3.7 mmol/L (ref 3.5–5.1)
Sodium: 138 mmol/L (ref 135–145)

## 2014-09-22 MED ORDER — SODIUM CHLORIDE 0.9 % IV SOLN
INTRAVENOUS | Status: DC
  Administered 2014-09-22: 10 mL/h via INTRAVENOUS

## 2014-09-22 MED ORDER — SODIUM CHLORIDE 0.9 % IV SOLN
INTRAVENOUS | Status: DC | PRN
  Administered 2014-09-22: 10 mL/h via INTRAVENOUS

## 2014-09-22 MED ORDER — HYDROCODONE-ACETAMINOPHEN 5-325 MG PO TABS: 1.0000 | ORAL_TABLET | Freq: Four times a day (QID) | ORAL | Status: DC | PRN

## 2014-09-22 MED ORDER — PROPOFOL 10 MG/ML IV BOLUS
INTRAVENOUS | Status: AC
  Administered 2014-09-22: 53.1 mg via INTRAVENOUS
  Filled 2014-09-22: qty 1

## 2014-09-22 MED ORDER — PROPOFOL 10 MG/ML IV BOLUS
0.5000 mg/kg | Freq: Once | INTRAVENOUS | Status: AC
  Administered 2014-09-22: 53.1 mg via INTRAVENOUS

## 2014-09-22 MED ORDER — PROPOFOL 10 MG/ML IV BOLUS
INTRAVENOUS | Status: AC | PRN
  Administered 2014-09-22 (×2): 53.1 mg via INTRAVENOUS

## 2014-09-22 NOTE — ED Notes (Addendum)
Ortho at bedside. Left hip immobilizer applied. MD Steinl at bedside to assess patient post sedation. No adverse events noted. Patient is resting comfortably.

## 2014-09-22 NOTE — ED Notes (Signed)
Pt comes via EMS [facility]). Pt reports he just had a hip replacement 8 weeks ago and today was running wire through his attic when he twisted his left hip and heard a "Pop!". Pt lowered from attic using stokes basket. Pt is unsure if hip is fractured or dislocated. Pt given a total of 26 mg of Morphine in route pain went from 10/10 to 7/10. Pt given 450 ml bolus of NS. Pt is alert and oriented.

## 2014-09-22 NOTE — Discharge Instructions (Signed)
It was our pleasure to provide your ER care today - we hope that you feel better.  Wear leg brace/support at all times (you may remove to bath).  You may take hydrocodone as need for pain. No driving for the next 6 hours or when taking hydrocodone. Also, do not take tylenol or acetaminophen containing medication when taking hydrocodone.  Follow up with Dr Charlann Boxer this week - call office Monday to arrange follow up appointment.  Have them review your xrays then.  Return to ER if worse, symptoms recur, worsening or intractable pain, leg numbness/weakness, other concern.    Prosthetic Hip Dislocation A dislocated hip prosthesis means that the artificial hip joint (prosthesis) has moved out of place. Once the prosthesis has been put back in place, you may resume your normal activities unless your caregiver tells you otherwise. You may use a cane, crutches, or a walker to relieve your discomfort as recommended by your caregiver. Pain medicine may be prescribed for the next few days. Do not lie on your affected hip until your caregiver approves.  If you were given medicine to help you relax (sedative), you should not drive, operate machinery, or make important decisions for at least 24 hours. You will also need a responsible person to drive you home. The following are tips to reduce the risk of having another dislocation:  Do not cross your legs. Try to keep your knees apart when getting in and out of your car.  Do not lean forward beyond 90 degrees or raise your knee higher than the level of your hip.  Do not sit in low chairs. Add extra cushions to increase the height of your chairs or couch.  Do not pivot. Take short steps when you turn. Contact your caregiver for follow-up as directed and if you have any other questions or concerns. SEEK IMMEDIATE MEDICAL CARE IF:  Your hip dislocates again.  You develop severe pain in your hip, unrelieved by medicine.  You have numbness, tingling,  temperature changes, or discoloration of your leg or foot.  You have difficulty breathing.  You develop any signs of allergy to medicine such as hives, skin rash, itching, nausea, and vomiting. Document Released: 10/16/2004 Document Revised: 12/01/2011 Document Reviewed: 10/13/2008 [facility] Patient Information 2015 [place], [place]. This information is not intended to replace advice given to you by your health care provider. Make sure you discuss any questions you have with your health care provider.

## 2014-09-22 NOTE — ED Provider Notes (Signed)
CSN: [id number]     Arrival date & time 09/22/14  2031 History   First MD Initiated Contact with Patient 09/22/14 2044     Chief Complaint  Patient presents with  . Hip Pain     (Consider location/radiation/quality/duration/timing/severity/associated sxs/prior Treatment) Patient is a 74 y.o. male presenting with hip pain. The history is provided by the patient.  Hip Pain Pertinent negatives include no chest pain, no abdominal pain, no headaches and no shortness of breath.  pt c/o left hip pain. Was in attic, bent/moved funny and had acute onset left hip pain. Hx left THA approximately 8 weeks ago, has done well since, no complications. Pain constant, severe, improved w meds by ems, worse w any movement. No radiation. No leg numbness/weakness. Denies any other pain or injury.     Past Medical History  Diagnosis Date  . Hypertension   . Hyperlipidemia   . Obesity   . ED (erectile dysfunction)   . IFG (impaired fasting glucose)   . Noncompliance   . Chronic hip pain     LEFT HIP  . Arthritis     PAIN AND OA LEFT HIP; ARTHRITIS BOTH KNEES   Past Surgical History  Procedure Laterality Date  . Hip fracture surgery  2009    LEFT HIP  . Refractive surgery Bilateral     LASIK  . Knee arthrotomy bilateral 50 + yrs ago    . Bilateral knee arthroscopy    . Cholecystectomy  2010  . Total hip arthroplasty Left 07/24/2014    Procedure: CONVERSION OF PREVIOUS HIP SURGERY LEFT HIP ARTHROPLASTY;  Surgeon: [name], MD;  Location: [facility] ORS;  Service: Orthopedics;  Laterality: Left;   Family History  Problem Relation Age of Onset  . Hypertension Mother   . Diabetes Mother   . Hypertension Father   . Heart attack Father   . Diabetes Sister    History  Substance Use Topics  . Smoking status: Former Games developer  . Smokeless tobacco: Never Used     Comment: quit 40-50 years ago  . Alcohol Use: No     Comment: QUIT SMOKING AGE 84    Review of Systems  Constitutional: Negative for  fever.  HENT: Negative for sore throat.   Eyes: Negative for redness.  Respiratory: Negative for shortness of breath.   Cardiovascular: Negative for chest pain.  Gastrointestinal: Negative for vomiting and abdominal pain.  Genitourinary: Negative for flank pain.  Musculoskeletal: Negative for back pain and neck pain.  Skin: Negative for rash.  Neurological: Negative for weakness, numbness and headaches.  Hematological: Does not bruise/bleed easily.  Psychiatric/Behavioral: Negative for confusion.      Allergies  Hctz and Lipitor  Home Medications   Prior to Admission medications   Medication Sig Start Date End Date Taking? Authorizing Provider  amLODipine (NORVASC) 10 MG tablet TAKE 1 TABLET EVERY DAY 09/13/14  Yes Merlyn Albert, MD  aspirin EC 325 MG tablet Take 325 mg by mouth daily.   Yes Historical Provider, MD  atenolol (TENORMIN) 50 MG tablet TAKE 1 TABLET TWICE DAILY 09/13/14  Yes Merlyn Albert, MD  benazepril (LOTENSIN) 40 MG tablet TAKE 1 TABLET TWICE DAILY 09/13/14  Yes Merlyn Albert, MD  gemfibrozil (LOPID) 600 MG tablet TAKE 1 TABLET TWICE DAILY BEFORE MEALS 09/13/14  Yes Merlyn Albert, MD  Omega-3 Fatty Acids (FISH OIL BURP-LESS) 1200 MG CAPS Take 3 capsules by mouth every morning.    Yes Historical Provider, MD  etodolac (LODINE) 400 MG tablet Take 1 tablet (400 mg total) by mouth 2 (two) times daily. Patient not taking: Reported on 09/22/2014 08/25/14   Merlyn Albert, MD  HYDROcodone-acetaminophen [facility]) 7.5-325 MG per tablet Take 1-2 tablets by mouth every 4 (four) hours as needed for moderate pain. Patient not taking: Reported on 09/22/2014 07/25/14   Genelle Gather Babish, PA-C  tiZANidine (ZANAFLEX) 4 MG tablet Take 1 tablet (4 mg total) by mouth every 6 (six) hours as needed for muscle spasms. Patient not taking: Reported on 08/25/2014 07/25/14   Genelle Gather Babish, PA-C   BP 188/85 mmHg  Pulse 69  Temp(Src) 97.7 F (36.5 C) (Oral)  Resp 14  SpO2  95% Physical Exam  Constitutional: He is oriented to person, place, and time. He appears well-developed and well-nourished. No distress.  HENT:  Mouth/Throat: Oropharynx is clear and moist.  Eyes: Conjunctivae are normal.  Neck: Neck supple. No tracheal deviation present.  Cardiovascular: Normal rate.   Pulmonary/Chest: Effort normal. No accessory muscle usage. No respiratory distress.  Abdominal: He exhibits no distension. There is no tenderness.  Musculoskeletal: Normal range of motion.  Left leg shortened, left hip internally rotated. Distal pulses palp.   Neurological: He is alert and oriented to person, place, and time.  LLE motor intact. sens grossly intact.   Skin: Skin is warm and dry.  Psychiatric: He has a normal mood and affect.  Nursing note and vitals reviewed.   ED Course  Procedural sedation Date/Time: 09/22/2014 10:38 PM Performed by: Suzi Roots Authorized by: Suzi Roots Consent: Verbal consent obtained. Written consent obtained. Risks and benefits: risks, benefits and alternatives were discussed Consent given by: patient Patient understanding: patient states understanding of the procedure being performed Patient consent: the patient's understanding of the procedure matches consent given Procedure consent: procedure consent matches procedure scheduled Relevant documents: relevant documents present and verified Test results: test results available and properly labeled Site marked: the operative site was marked Imaging studies: imaging studies available Patient identity confirmed: verbally with patient, arm band, provided demographic data and hospital-assigned identification number Time out: Immediately prior to procedure a "time out" was called to verify the correct patient, procedure, equipment, support staff and site/side marked as required. Local anesthesia used: no Patient sedated: yes Sedation type: moderate (conscious) sedation Sedatives:  propofol Vitals: Vital signs were monitored during sedation. Patient tolerance: Patient tolerated the procedure well with no immediate complications Comments: No complications. Pt awake and alert post procedure. Distal pulses palp.    (including critical care time) Labs Review  Results for orders placed or performed during the hospital encounter of 09/22/14  CBC  Result Value Ref Range   WBC 12.5 (H) 4.0 - 10.5 K/uL   RBC 4.87 4.22 - 5.81 MIL/uL   Hemoglobin 14.7 13.0 - 17.0 g/dL   HCT 62.1 30.8 - 65.7 %   MCV 89.9 78.0 - 100.0 fL   MCH 30.2 26.0 - 34.0 pg   MCHC 33.6 30.0 - 36.0 g/dL   RDW 84.6 96.2 - 95.2 %   Platelets 242 150 - 400 K/uL  Basic metabolic panel  Result Value Ref Range   Sodium 138 135 - 145 mmol/L   Potassium 3.7 3.5 - 5.1 mmol/L   Chloride 103 96 - 112 mEq/L   CO2 27 19 - 32 mmol/L   Glucose, Bld 120 (H) 70 - 99 mg/dL   BUN 23 6 - 23 mg/dL   Creatinine, Ser 8.41 0.50 - 1.35 mg/dL  Calcium 10.2 8.4 - 10.5 mg/dL   GFR calc non Af Amer 69 (L) >90 mL/min   GFR calc Af Amer 80 (L) >90 mL/min   Anion gap 8 5 - 15   Dg Hip Complete Left  09/22/2014   CLINICAL DATA:  Heard pop, with twisting injury to left hip. Initial encounter.  EXAM: LEFT HIP - COMPLETE 2+ VIEW  COMPARISON:  Left hip radiographs performed 07/24/2014  FINDINGS: There is superior dislocation of the patient's left hip prosthesis. Underlying plates and screws appear grossly intact. Note is made of lucency about the acetabular component of the prosthesis, new from the prior study, raising concern for some degree of particle disease. The right hip joint is unremarkable in appearance. No fractures are seen. The sacroiliac joints are grossly unremarkable.  The visualized bowel gas pattern is grossly unremarkable.  IMPRESSION: 1. Superior dislocation of the patient's left hip prosthesis. Underlying hardware appears grossly intact. 2. Lucency about the acetabular component of the prosthesis, new from the prior  study, raising concern for some degree of particle disease.   Electronically Signed   By: Roanna Raider M.D.   On: 09/22/2014 21:51       MDM   Iv ns. Xrays.  Reviewed nursing notes and prior charts for additional history.    Discussed pt with Dr Rennis Chris, on call for Dr Charlann Boxer, including 8 weeks post surgery, Dr Rennis Chris reviewed films and requests we reduce in ED.  Propofol/procedure sedation - see notes above. Sedation excellent/adequate. Pt tolerated procedure well. Pulse ox > 97% at all times.  With longitudinal/vertical traction hip reduced. Distal pulses palp. Brace applied by ortho tech.  Post procedure xray ordered.  Post reduction films show good reduction.  Recheck pt comfortable. Distal pulses palp. No leg numbness/weakness.  Pt stable for d/c.       Suzi Roots, MD 09/22/14 [id number]

## 2014-09-22 NOTE — ED Notes (Signed)
Bed: RESB Expected date:  Expected time:  Means of arrival:  Comments: [facility] EMS/?hip dislocation

## 2014-09-27 ENCOUNTER — Ambulatory Visit (HOSPITAL_COMMUNITY)
Admission: RE | Admit: 2014-09-27 | Discharge: 2014-09-27 | Disposition: A | Discharge status: Home / Self Care | Payer: Commercial Managed Care - HMO | Source: Ambulatory Visit | Attending: Family Medicine | Admitting: Family Medicine

## 2014-09-27 DIAGNOSIS — M7731 Calcaneal spur, right foot: Secondary | ICD-10-CM | POA: Insufficient documentation

## 2014-09-27 DIAGNOSIS — Z96642 Presence of left artificial hip joint: Secondary | ICD-10-CM | POA: Insufficient documentation

## 2014-09-27 DIAGNOSIS — M19071 Primary osteoarthritis, right ankle and foot: Secondary | ICD-10-CM | POA: Diagnosis not present

## 2014-09-27 DIAGNOSIS — M79671 Pain in right foot: Secondary | ICD-10-CM

## 2014-09-28 ENCOUNTER — Telehealth: Payer: Self-pay | Admitting: *Deleted

## 2014-09-28 DIAGNOSIS — M1990 Unspecified osteoarthritis, unspecified site: Secondary | ICD-10-CM

## 2014-09-28 NOTE — Telephone Encounter (Signed)
Patient notified and verbalized understanding. 

## 2014-09-28 NOTE — Telephone Encounter (Signed)
Patient's wife called wanting the results of the foot xray from yesterday b/c husband is still in a lot of pain.

## 2014-09-28 NOTE — Addendum Note (Signed)
Addended byOneal Deputy: Nishi Neiswonger D on: 09/28/2014 01:36 PM   Modules accepted: Orders

## 2014-09-28 NOTE — Telephone Encounter (Signed)
Referral placed.

## 2014-09-28 NOTE — Telephone Encounter (Signed)
Sig degenerative arthritis changes needs podiatrist

## 2014-10-06 ENCOUNTER — Telehealth: Payer: Self-pay

## 2014-10-19 NOTE — Telephone Encounter (Signed)
Pt due for 10 year colonoscopy. Phone number not working. Mailed letter to call.

## 2014-10-23 ENCOUNTER — Ambulatory Visit (INDEPENDENT_AMBULATORY_CARE_PROVIDER_SITE_OTHER): Payer: Commercial Managed Care - HMO | Admitting: Podiatry

## 2014-10-23 ENCOUNTER — Ambulatory Visit (INDEPENDENT_AMBULATORY_CARE_PROVIDER_SITE_OTHER): Payer: Commercial Managed Care - HMO

## 2014-10-23 ENCOUNTER — Encounter: Payer: Self-pay | Admitting: Podiatry

## 2014-10-23 VITALS — BP 175/65 | HR 66 | Resp 16 | Ht 71.0 in | Wt 225.0 lb

## 2014-10-23 DIAGNOSIS — M722 Plantar fascial fibromatosis: Secondary | ICD-10-CM

## 2014-10-23 DIAGNOSIS — M79673 Pain in unspecified foot: Secondary | ICD-10-CM

## 2014-10-23 DIAGNOSIS — M779 Enthesopathy, unspecified: Secondary | ICD-10-CM

## 2014-10-23 DIAGNOSIS — B351 Tinea unguium: Secondary | ICD-10-CM | POA: Diagnosis not present

## 2014-10-23 MED ORDER — TRIAMCINOLONE ACETONIDE 10 MG/ML IJ SUSP
10.0000 mg | Freq: Once | INTRAMUSCULAR | Status: AC
  Administered 2014-10-23: 10 mg

## 2014-10-23 NOTE — Progress Notes (Signed)
   Subjective:    Patient ID: Hector [name], male    DOB: April 04, 1941, 74 y.o.   MRN: [id number]  HPI Comments: "I have some trouble probably from having my hip replaced"  Patient c/o aching dorsal foot and plantar heel right for several months. He had a hip replacements and thought maybe was putting more weight on the right side. AM pain. PCP Rx'd anti-inflammatory-no help.  Also, wants to discuss toenail fungus-nails are thick and discolored.     Review of Systems  Musculoskeletal: Positive for arthralgias and gait problem.  All other systems reviewed and are negative.      Objective:   Physical Exam        Assessment & Plan:

## 2014-10-23 NOTE — Patient Instructions (Signed)

## 2014-10-23 NOTE — Progress Notes (Signed)
Subjective:     Patient ID: Hector [name], male   DOB: [birth year], 74 y.o.   MRN: [id number]  HPI patient had the left hip replaced and presents with pain in the dorsum of the right foot in the right heel which is making ambulation difficult. Patient has tried different other modalities without relief of symptoms and needs to walk due to the hip replacement done 3 months ago   Review of Systems  All other systems reviewed and are negative.      Objective:   Physical Exam  Constitutional: He is oriented to person, place, and time.  Cardiovascular: Intact distal pulses.   Musculoskeletal: Normal range of motion.  Neurological: He is oriented to person, place, and time.  Skin: Skin is warm.  Nursing note and vitals reviewed.  neurovascular status intact with muscle strength adequate and range of motion subtalar midtarsal joint within normal limits. Patient is noted to have significant discomfort right plantar heel at the insertion of the tendon into the calcaneus and mild discomfort dorsally around the midtarsal joint where there is obvious prominence within this area. Patient is noted to have good digital perfusion and is well oriented 3     Assessment:     Acute plantar fasciitis right with dorsal spur formation and mild tendinitis which is improving    Plan:     H&P and x-ray reviewed and today injected the right plantar fascia 3 mg Kenalog 5 mg Xylocaine and applied fascial brace with instructions. Instructed on physical therapy and reappoint in 2 weeks

## 2014-10-27 ENCOUNTER — Ambulatory Visit (INDEPENDENT_AMBULATORY_CARE_PROVIDER_SITE_OTHER): Payer: Commercial Managed Care - HMO | Admitting: Family Medicine

## 2014-10-27 ENCOUNTER — Encounter: Payer: Self-pay | Admitting: Family Medicine

## 2014-10-27 VITALS — BP 130/82 | Ht 71.25 in | Wt 240.0 lb

## 2014-10-27 DIAGNOSIS — R7301 Impaired fasting glucose: Secondary | ICD-10-CM | POA: Diagnosis not present

## 2014-10-27 DIAGNOSIS — I10 Essential (primary) hypertension: Secondary | ICD-10-CM | POA: Diagnosis not present

## 2014-10-27 MED ORDER — GEMFIBROZIL 600 MG PO TABS: ORAL_TABLET | ORAL | Status: DC

## 2014-10-27 MED ORDER — ATENOLOL 50 MG PO TABS: 50.0000 mg | ORAL_TABLET | Freq: Two times a day (BID) | ORAL | Status: DC

## 2014-10-27 MED ORDER — BENAZEPRIL HCL 40 MG PO TABS: 40.0000 mg | ORAL_TABLET | Freq: Two times a day (BID) | ORAL | Status: DC

## 2014-10-27 MED ORDER — AMLODIPINE BESYLATE 10 MG PO TABS: 10.0000 mg | ORAL_TABLET | Freq: Every day | ORAL | Status: DC

## 2014-10-27 NOTE — Progress Notes (Signed)
   Subjective:    Patient ID: Hector [name], male    DOB: [birth year], 74 y.o.   MRN: [id number]  Hypertension This is a chronic problem. The current episode started more than 1 year ago. Risk factors for coronary artery disease include dyslipidemia and male gender. Treatments tried: norvasc,lotensin,atenolol.   Ongoing challenges with foot and hip pain, fairly severe at times and limiting exercise, followed by pod and ortho  Sticking with lipid meds, and watching chol int the diet. Has cut down fats mostly.  BPs generally good when cked wlsewhere,  Watching sugar intake mostly. History of glucose intolerance.  Compliant with lipid medication. No obvious side effect. Has cut down fats intake.  Using ibuprofen two as needed for arthritis pain   Needs blood work  Review of Systems No headache no chest pain no back pain no abdominal pain no change in bowel habits no blood in stool ROS otherwise negative    Objective:   Physical Exam  Alert no apparent distress blood pressure good on repeat HEENT normal. Lungs clear. Heart regular in rhythm. Ankles without edema      Assessment & Plan:  Impression 1 hypertension good control #2 hyperlipidemia status uncertain. #3 impaired fasting glucose status uncertain #4 arthritis discuss plan exercise diet discussed in encourage. Appropriate blood work. Further recommendations based results. Check every 6 months. WSL

## 2014-10-31 DIAGNOSIS — I10 Essential (primary) hypertension: Secondary | ICD-10-CM | POA: Diagnosis not present

## 2014-10-31 DIAGNOSIS — R7301 Impaired fasting glucose: Secondary | ICD-10-CM | POA: Diagnosis not present

## 2014-10-31 LAB — HEPATIC FUNCTION PANEL
ALBUMIN: 4.5 g/dL (ref 3.5–5.2)
ALK PHOS: 54 U/L (ref 39–117)
ALT: 20 U/L (ref 0–53)
AST: 19 U/L (ref 0–37)
Bilirubin, Direct: 0.1 mg/dL (ref 0.0–0.3)
Indirect Bilirubin: 0.5 mg/dL (ref 0.2–1.2)
TOTAL PROTEIN: 7.5 g/dL (ref 6.0–8.3)
Total Bilirubin: 0.6 mg/dL (ref 0.2–1.2)

## 2014-10-31 LAB — LIPID PANEL
Cholesterol: 153 mg/dL (ref 0–200)
HDL: 36 mg/dL — AB (ref >39)
LDL CALC: 82 mg/dL (ref 0–99)
Total CHOL/HDL Ratio: 4.3 Ratio
Triglycerides: 174 mg/dL — ABNORMAL HIGH (ref <150)
VLDL: 35 mg/dL (ref 0–40)

## 2014-10-31 LAB — GLUCOSE, RANDOM: Glucose, Bld: 97 mg/dL (ref 70–99)

## 2014-11-06 ENCOUNTER — Ambulatory Visit: Payer: Commercial Managed Care - HMO | Admitting: Podiatry

## 2014-11-06 ENCOUNTER — Encounter: Payer: Self-pay | Admitting: Family Medicine

## 2014-11-08 ENCOUNTER — Encounter: Payer: Self-pay | Admitting: Podiatry

## 2014-11-08 ENCOUNTER — Ambulatory Visit (INDEPENDENT_AMBULATORY_CARE_PROVIDER_SITE_OTHER): Payer: Commercial Managed Care - HMO | Admitting: Podiatry

## 2014-11-08 VITALS — BP 190/85 | HR 55 | Resp 16

## 2014-11-08 DIAGNOSIS — M779 Enthesopathy, unspecified: Secondary | ICD-10-CM | POA: Diagnosis not present

## 2014-11-08 DIAGNOSIS — M722 Plantar fascial fibromatosis: Secondary | ICD-10-CM

## 2014-11-08 MED ORDER — TRIAMCINOLONE ACETONIDE 10 MG/ML IJ SUSP
10.0000 mg | Freq: Once | INTRAMUSCULAR | Status: AC
  Administered 2014-11-08: 10 mg

## 2014-11-08 NOTE — Progress Notes (Signed)
Subjective:     Patient ID: Hector [name], male   DOB: Feb 09, 1941, 74 y.o.   MRN: [id number]  HPI patient states that his right heel is feeling quite a bit better but he's having continued discomfort on the dorsum of the right foot midtarsal joint with inflammation and pain with ambulation   Review of Systems     Objective:   Physical Exam Neurovascular status intact with significant diminishment of discomfort plantar aspect right heel with discomfort in the midtarsal joint right upon palpation and spur formation noted    Assessment:     Inflammatory tendinitis dorsal right with probable arthritis and spur formation and improved plantar fasciitis right    Plan:     Continue physical therapy and stretching exercises for the plantar right and today I injected the midtarsal joint right 3 mg Kenalog 5 mg Xylocaine and advised on mild heat and shoe gear usage that does not put pressure against it. Reappoint if symptoms persist

## 2014-11-21 LAB — HM COLONOSCOPY: HM Colonoscopy: NORMAL

## 2015-02-13 ENCOUNTER — Other Ambulatory Visit: Payer: Self-pay

## 2015-02-13 ENCOUNTER — Telehealth: Payer: Self-pay

## 2015-02-13 DIAGNOSIS — Z1211 Encounter for screening for malignant neoplasm of colon: Secondary | ICD-10-CM

## 2015-02-13 NOTE — Telephone Encounter (Signed)
Patient can be reached at [phone] or cell [phone]  He is ready to schedule his tcs and the new numbers have been updated.

## 2015-02-13 NOTE — Telephone Encounter (Signed)
See separate triage.  

## 2015-02-14 NOTE — Telephone Encounter (Addendum)
Gastroenterology Pre-Procedure Review  Request Date: 02/13/2015 Requesting Physician:  ON 10 YEAR RECALL  PATIENT REVIEW QUESTIONS: The patient responded to the following health history questions as indicated:    1. Diabetes Melitis: no 2. Joint replacements in the past 12 months: HIP REPLACEMENT 07/2014 3. Major health problems in the past 3 months: no 4. Has an artificial valve or MVP: no 5. Has a defibrillator: no 6. Has been advised in past to take antibiotics in advance of a procedure like teeth cleaning: yes, after hip replacement    MEDICATIONS & ALLERGIES:    Patient reports the following regarding taking any blood thinners:   Plavix? no Aspirin? no Coumadin? no  Patient confirms/reports the following medications:  Current Outpatient Prescriptions  Medication Sig Dispense Refill  . amLODipine (NORVASC) 10 MG tablet Take 1 tablet (10 mg total) by mouth daily. 90 tablet 1  . atenolol (TENORMIN) 50 MG tablet Take 1 tablet (50 mg total) by mouth 2 (two) times daily. 180 tablet 1  . benazepril (LOTENSIN) 40 MG tablet Take 1 tablet (40 mg total) by mouth 2 (two) times daily. 180 tablet 1  . gemfibrozil (LOPID) 600 MG tablet TAKE 1 TABLET TWICE DAILY BEFORE MEALS 180 tablet 1  . Omega-3 Fatty Acids (FISH OIL BURP-LESS) 1200 MG CAPS Take 3 capsules by mouth every morning.     Marland Kitchen. aspirin EC 325 MG tablet Take 325 mg by mouth daily.     No current facility-administered medications for this visit.    Patient confirms/reports the following allergies:  Allergies  Allergen Reactions  . Hctz [Hydrochlorothiazide]     Erectile Dysfunction  . Lipitor [Atorvastatin]     Muscle and joint pain    No orders of the defined types were placed in this encounter.    AUTHORIZATION INFORMATION Primary Insurance:   ID #:  Group #:  Pre-Cert / Auth required: Pre-Cert / Auth #:   Secondary Insurance:   ID #: Group #:  Pre-Cert / Auth required: [id number] Pre-Cert / Auth #:   SCHEDULE  INFORMATION: Procedure has been scheduled as follows:  Date: 03/21/2015          Time:  9:30 am Location: [facility] Short Stay  This Gastroenterology Pre-Precedure Review Form is being routed to the following provider(s): R. Roetta SessionsMichael Rourk, MD

## 2015-02-14 NOTE — Telephone Encounter (Signed)
Ok to schedule.

## 2015-02-15 ENCOUNTER — Telehealth: Payer: Self-pay

## 2015-02-15 MED ORDER — PEG-KCL-NACL-NASULF-NA ASC-C 100 G PO SOLR: 1.0000 | ORAL | Status: DC

## 2015-02-15 MED ORDER — PEG 3350-KCL-NA BICARB-NACL 420 G PO SOLR: 4000.0000 mL | ORAL | Status: DC

## 2015-02-15 NOTE — Telephone Encounter (Signed)
Rx sent to the pharmacy and instructions mailed to pt.  

## 2015-02-15 NOTE — Telephone Encounter (Signed)
[facility] from [facility], Movie prep not covered by pt's insurance. I'm sending in trilyte and mailing instructions. ( Did not mail the movie prep instructions)

## 2015-03-14 ENCOUNTER — Telehealth: Payer: Self-pay

## 2015-03-14 NOTE — Telephone Encounter (Signed)
I called pt and LMOM ( both numbers) for a return call to update medication list prior to procedure on 03/21/2015.

## 2015-03-14 NOTE — Telephone Encounter (Signed)
I went online and pt does not need a PA for the [facility]. Authorization # for suspension is #[id number]

## 2015-03-15 NOTE — Telephone Encounter (Signed)
Pt said he has not had any change in his meds since he was triaged for the colonoscopy.

## 2015-03-15 NOTE — Telephone Encounter (Signed)
OK to schedule

## 2015-03-19 ENCOUNTER — Telehealth: Payer: Self-pay | Admitting: *Deleted

## 2015-03-19 NOTE — Telephone Encounter (Signed)
Pt called office back and he is aware of instructions for prep and day of procedure

## 2015-03-19 NOTE — Telephone Encounter (Signed)
Pt called with questions about his colonoscopy on Wednesday. Please advise home number is [phone] or cell [phone]

## 2015-03-19 NOTE — Telephone Encounter (Signed)
Called pt and LMOM for pt to call us back

## 2015-03-21 ENCOUNTER — Encounter (HOSPITAL_COMMUNITY)
Admission: RE | Disposition: A | Discharge status: Home / Self Care | Payer: Self-pay | Source: Ambulatory Visit | Attending: Internal Medicine

## 2015-03-21 ENCOUNTER — Encounter (HOSPITAL_COMMUNITY): Payer: Self-pay | Admitting: *Deleted

## 2015-03-21 ENCOUNTER — Ambulatory Visit (HOSPITAL_COMMUNITY)
Admission: RE | Admit: 2015-03-21 | Discharge: 2015-03-21 | Disposition: A | Discharge status: Home / Self Care | Payer: Commercial Managed Care - HMO | Source: Ambulatory Visit | Attending: Internal Medicine | Admitting: Internal Medicine

## 2015-03-21 DIAGNOSIS — I10 Essential (primary) hypertension: Secondary | ICD-10-CM | POA: Insufficient documentation

## 2015-03-21 DIAGNOSIS — G8929 Other chronic pain: Secondary | ICD-10-CM | POA: Diagnosis not present

## 2015-03-21 DIAGNOSIS — Z79899 Other long term (current) drug therapy: Secondary | ICD-10-CM | POA: Insufficient documentation

## 2015-03-21 DIAGNOSIS — E785 Hyperlipidemia, unspecified: Secondary | ICD-10-CM | POA: Diagnosis not present

## 2015-03-21 DIAGNOSIS — Z96642 Presence of left artificial hip joint: Secondary | ICD-10-CM | POA: Diagnosis not present

## 2015-03-21 DIAGNOSIS — K5730 Diverticulosis of large intestine without perforation or abscess without bleeding: Secondary | ICD-10-CM | POA: Diagnosis not present

## 2015-03-21 DIAGNOSIS — Z7982 Long term (current) use of aspirin: Secondary | ICD-10-CM | POA: Diagnosis not present

## 2015-03-21 DIAGNOSIS — Z6831 Body mass index (BMI) 31.0-31.9, adult: Secondary | ICD-10-CM | POA: Insufficient documentation

## 2015-03-21 DIAGNOSIS — E669 Obesity, unspecified: Secondary | ICD-10-CM | POA: Insufficient documentation

## 2015-03-21 DIAGNOSIS — Z1211 Encounter for screening for malignant neoplasm of colon: Secondary | ICD-10-CM | POA: Diagnosis not present

## 2015-03-21 DIAGNOSIS — K6289 Other specified diseases of anus and rectum: Secondary | ICD-10-CM | POA: Insufficient documentation

## 2015-03-21 DIAGNOSIS — Z87891 Personal history of nicotine dependence: Secondary | ICD-10-CM | POA: Insufficient documentation

## 2015-03-21 DIAGNOSIS — M25552 Pain in left hip: Secondary | ICD-10-CM | POA: Diagnosis not present

## 2015-03-21 HISTORY — PX: COLONOSCOPY: SHX5424

## 2015-03-21 SURGERY — COLONOSCOPY
Anesthesia: Moderate Sedation

## 2015-03-21 MED ORDER — MEPERIDINE HCL 100 MG/ML IJ SOLN
INTRAMUSCULAR | Status: AC
  Filled 2015-03-21: qty 2

## 2015-03-21 MED ORDER — SODIUM CHLORIDE 0.9 % IV SOLN
INTRAVENOUS | Status: DC
  Administered 2015-03-21: 1000 mL via INTRAVENOUS

## 2015-03-21 MED ORDER — STERILE WATER FOR IRRIGATION IR SOLN
Status: DC | PRN
  Administered 2015-03-21: 09:00:00

## 2015-03-21 MED ORDER — MIDAZOLAM HCL 5 MG/5ML IJ SOLN
INTRAMUSCULAR | Status: DC | PRN
  Administered 2015-03-21: 1 mg via INTRAVENOUS
  Administered 2015-03-21: 2 mg via INTRAVENOUS
  Administered 2015-03-21: 1 mg via INTRAVENOUS

## 2015-03-21 MED ORDER — ONDANSETRON HCL 4 MG/2ML IJ SOLN
INTRAMUSCULAR | Status: AC
  Filled 2015-03-21: qty 2

## 2015-03-21 MED ORDER — MEPERIDINE HCL 100 MG/ML IJ SOLN
INTRAMUSCULAR | Status: DC | PRN
  Administered 2015-03-21: 25 mg via INTRAVENOUS
  Administered 2015-03-21: 50 mg via INTRAVENOUS

## 2015-03-21 MED ORDER — MIDAZOLAM HCL 5 MG/5ML IJ SOLN
INTRAMUSCULAR | Status: AC
  Filled 2015-03-21: qty 10

## 2015-03-21 MED ORDER — ONDANSETRON HCL 4 MG/2ML IJ SOLN
INTRAMUSCULAR | Status: DC | PRN
  Administered 2015-03-21: 4 mg via INTRAVENOUS

## 2015-03-21 NOTE — Op Note (Signed)
[facility] [address] [place], [id number]   COLONOSCOPY PROCEDURE REPORT  PATIENT: [name], Hector F  MR#: [id number] BIRTHDATE: [birth year] , 74  yrs. old GENDER: male ENDOSCOPIST: R.  Roetta SessionsMichael Jewelle Whitner, MD FACP [facility] REFERRED AO:ZHYQMVHBY:Stephen Gerda DissLuking, M.D. PROCEDURE DATE:  03/21/2015 PROCEDURE:   Ileo-colonoscopy, screening INDICATIONS:Average risk colorectal cancer screening examination MEDICATIONS: Versed 4 mg IV and Demerol 75 mg IV in divided doses. Zofran 4 mg IV. ASA CLASS:       Class II  CONSENT: The risks, benefits, alternatives and imponderables including but not limited to bleeding, perforation as well as the possibility of a missed lesion have been reviewed.  The potential for biopsy, lesion removal, etc. have also been discussed. Questions have been answered.  All parties agreeable.  Please see the history and physical in the medical record for more information.  DESCRIPTION OF PROCEDURE:   After the risks benefits and alternatives of the procedure were thoroughly explained, informed consent was obtained.  The digital rectal exam revealed no abnormalities of the rectum.   The EC-3890Li ([id number]  endoscope was introduced through the anus and advanced to the terminal ileum which was intubated for a short distance. No adverse events experienced.   The quality of the prep was adequate  The instrument was then slowly withdrawn as the colon was fully examined. Estimated blood loss is zero unless otherwise noted in this procedure report.      COLON FINDINGS: Multiple anal papilla?"1 prominent.  Normal-appearing rectal mucosa otherwise.  Scattered left-sided diverticula; the remainder of the colonic mucosa appeared normal.   The distal 10 cm of terminal ileal mucosa also appeared normal.  Retroflexion was performed. .  Withdrawal time=10 minutes 0 seconds.  The scope was withdrawn and the procedure completed. COMPLICATIONS: There were no immediate  complications.  ENDOSCOPIC IMPRESSION: Prominent anal papilla. Colonic diverticulosis  RECOMMENDATIONS: No future colonoscopy recommended unless patient develops symptoms  eSigned:  R. Roetta SessionsMichael Shyheem Whitham, MD Jerrel IvoryFACP [address] [facility] 03/21/2015 9:50 AM   cc:  CPT CODES: ICD CODES:  The ICD and CPT codes recommended by this software are interpretations from the data that the clinical staff has captured with the software.  The verification of the translation of this report to the ICD and CPT codes and modifiers is the sole responsibility of the health care institution and practicing physician where this report was generated.  [employer] will not be held responsible for the validity of the ICD and CPT codes included on this report.  AMA assumes no liability for data contained or not contained herein. CPT is a Publishing rights managerregistered trademark of the [employer].  PATIENT NAME:  [name], Hector F MR#: [id number]

## 2015-03-21 NOTE — H&P (Signed)
$'@LOGO'H$ @   Primary Care Physician:  Mickie Hillier, MD Primary Gastroenterologist:  Dr. Gala Romney  Pre-Procedure History & Physical: HPI:  Hector [name] is a 74 y.o. male is here for a screening colonoscopy.  Diverticulosis-left-sided last screening colonoscopy 2005. No bowel symptoms. No family history of colon cancer.  Past Medical History  Diagnosis Date  . Hypertension   . Hyperlipidemia   . Obesity   . ED (erectile dysfunction)   . IFG (impaired fasting glucose)   . Noncompliance   . Chronic hip pain     LEFT HIP  . Arthritis     PAIN AND OA LEFT HIP; ARTHRITIS BOTH KNEES    Past Surgical History  Procedure Laterality Date  . Hip fracture surgery  2009    LEFT HIP  . Refractive surgery Bilateral     LASIK  . Knee arthrotomy bilateral 50 + yrs ago    . Bilateral knee arthroscopy    . Cholecystectomy  2010  . Total hip arthroplasty Left 07/24/2014    Procedure: CONVERSION OF PREVIOUS HIP SURGERY LEFT HIP ARTHROPLASTY;  Surgeon: Mauri Pole, MD;  Location: [facility];  Service: Orthopedics;  Laterality: Left;    Prior to Admission medications   Medication Sig Start Date End Date Taking? Authorizing Provider  amLODipine (NORVASC) 10 MG tablet Take 1 tablet (10 mg total) by mouth daily. 10/27/14  Yes Mikey Kirschner, MD  atenolol (TENORMIN) 50 MG tablet Take 1 tablet (50 mg total) by mouth 2 (two) times daily. 10/27/14  Yes Mikey Kirschner, MD  benazepril (LOTENSIN) 40 MG tablet Take 1 tablet (40 mg total) by mouth 2 (two) times daily. 10/27/14  Yes Mikey Kirschner, MD  gemfibrozil (LOPID) 600 MG tablet TAKE 1 TABLET TWICE DAILY BEFORE MEALS 10/27/14  Yes Mikey Kirschner, MD  polyethylene glycol-electrolytes (TRILYTE) 420 G solution Take 4,000 mLs by mouth as directed. 02/15/15  Yes Daneil Dolin, MD  aspirin EC 325 MG tablet Take 325 mg by mouth daily.    Historical Provider, MD  peg 3350 powder (MOVIPREP) 100 G SOLR Take 1 kit (200 g total) by mouth as directed. Patient not  taking: Reported on 03/12/2015 02/15/15   Daneil Dolin, MD    Allergies as of 02/13/2015 - Review Complete 02/13/2015  Allergen Reaction Noted  . Hctz [hydrochlorothiazide]  01/07/2013  . Lipitor [atorvastatin]  01/07/2013    Family History  Problem Relation Age of Onset  . Hypertension Mother   . Diabetes Mother   . Hypertension Father   . Heart attack Father   . Diabetes Sister     History   Social History  . Marital Status: Married    Spouse Name: N/A  . Number of Children: N/A  . Years of Education: N/A   Occupational History  . Not on file.   Social History Main Topics  . Smoking status: Former Research scientist (life sciences)  . Smokeless tobacco: Never Used     Comment: quit 40-50 years ago  . Alcohol Use: No     Comment: QUIT SMOKING AGE 89  . Drug Use: No  . Sexual Activity: Not on file   Other Topics Concern  . Not on file   Social History Narrative    Review of Systems: See HPI, otherwise negative ROS  Physical Exam: BP 183/88 mmHg  Pulse 48  Temp(Src) 97.7 F (36.5 C) (Oral)  Resp 20  Ht 5' 11.25" (1.81 m)  Wt 227 lb (102.967 kg)  BMI  31.43 kg/m2  SpO2 96% General:   Alert,  Well-developed, well-nourished, pleasant and cooperative in NAD Head:  Normocephalic and atraumatic. Eyes:  Sclera clear, no icterus.   Conjunctiva pink. Ears:  Normal auditory acuity. Nose:  No deformity, discharge,  or lesions. Mouth:  No deformity or lesions, dentition normal. Neck:  Supple; no masses or thyromegaly. Lungs:  Clear throughout to auscultation.   No wheezes, crackles, or rhonchi. No acute distress. Heart:  Regular rate and rhythm; no murmurs, clicks, rubs,  or gallops. Abdomen:  Soft, nontender and nondistended. No masses, hepatosplenomegaly or hernias noted. Normal bowel sounds, without guarding, and without rebound.   Msk:  Symmetrical without gross deformities. Normal posture. Pulses:  Normal pulses noted. Extremities:  Without clubbing or edema. Neurologic:  Alert and   oriented x4;  grossly normal neurologically. Skin:  Intact without significant lesions or rashes. Cervical Nodes:  No significant cervical adenopathy. Psych:  Alert and cooperative. Normal mood and affect.  Impression/Plan: Hector [name] is now here to undergo a screening colonoscopy.  Average risk screening examination.  Risks, benefits, limitations, imponderables and alternatives regarding colonoscopy have been reviewed with the patient. Questions have been answered. All parties agreeable.     Notice:  This dictation was prepared with Dragon dictation along with smaller phrase technology. Any transcriptional errors that result from this process are unintentional and may not be corrected upon review.

## 2015-03-21 NOTE — Discharge Instructions (Signed)
Colonoscopy, Care After °These instructions give you information on caring for yourself after your procedure. Your doctor may also give you more specific instructions. Call your doctor if you have any problems or questions after your procedure. °HOME CARE °· Do not drive for 24 hours. °· Do not sign important papers or use machinery for 24 hours. °· You may shower. °· You may go back to your usual activities, but go slower for the first 24 hours. °· Take rest breaks often during the first 24 hours. °· Walk around or use warm packs on your belly (abdomen) if you have belly cramping or gas. °· Drink enough fluids to keep your pee (urine) clear or pale yellow. °· Resume your normal diet. Avoid heavy or fried foods. °· Avoid drinking alcohol for 24 hours or as told by your doctor. °· Only take medicines as told by your doctor. °If a tissue sample (biopsy) was taken during the procedure:  °· Do not take aspirin or blood thinners for 7 days, or as told by your doctor. °· Do not drink alcohol for 7 days, or as told by your doctor. °· Eat soft foods for the first 24 hours. °GET HELP IF: °You still have a small amount of blood in your poop (stool) 2-3 days after the procedure. °GET HELP RIGHT AWAY IF: °· You have more than a small amount of blood in your poop. °· You see clumps of tissue (blood clots) in your poop. °· Your belly is puffy (swollen). °· You feel sick to your stomach (nauseous) or throw up (vomit). °· You have a fever. °· You have belly pain that gets worse and medicine does not help. °MAKE SURE YOU: °· Understand these instructions. °· Will watch your condition. °· Will get help right away if you are not doing well or get worse. °Document Released: 10/11/2010 Document Revised: 09/13/2013 Document Reviewed: 05/16/2013 °ExitCare® Patient Information ©2015 [employer]. This information is not intended to replace advice given to you by your health care provider. Make sure you discuss any questions you have with  your health care provider. °Colon Polyps °Polyps are lumps of extra tissue growing inside the body. Polyps can grow in the large intestine (colon). Most colon polyps are noncancerous (benign). However, some colon polyps can become cancerous over time. Polyps that are larger than a pea may be harmful. To be safe, caregivers remove and test all polyps. °CAUSES  °Polyps form when mutations in the genes cause your cells to grow and divide even though no more tissue is needed. °RISK FACTORS °There are a number of risk factors that can increase your chances of getting colon polyps. They include: °· Being older than 50 years. °· Family history of colon polyps or colon cancer. °· Long-term colon diseases, such as colitis or Crohn disease. °· Being overweight. °· Smoking. °· Being inactive. °· Drinking too much alcohol. °SYMPTOMS  °Most small polyps do not cause symptoms. If symptoms are present, they may include: °· Blood in the stool. The stool may look dark red or black. °· Constipation or diarrhea that lasts longer than 1 week. °DIAGNOSIS °People often do not know they have polyps until their caregiver finds them during a regular checkup. Your caregiver can use 4 tests to check for polyps: °· Digital rectal exam. The caregiver wears gloves and feels inside the rectum. This test would find polyps only in the rectum. °· Barium enema. The caregiver puts a liquid called barium into your rectum before taking X-rays   of your colon. Barium makes your colon look white. Polyps are dark, so they are easy to see in the X-ray pictures. °· Sigmoidoscopy. A thin, flexible tube (sigmoidoscope) is placed into your rectum. The sigmoidoscope has a light and tiny camera in it. The caregiver uses the sigmoidoscope to look at the last third of your colon. °· Colonoscopy. This test is like sigmoidoscopy, but the caregiver looks at the entire colon. This is the most common method for finding and removing polyps. °TREATMENT  °Any polyps will be  removed during a sigmoidoscopy or colonoscopy. The polyps are then tested for cancer. °PREVENTION  °To help lower your risk of getting more colon polyps: °· Eat plenty of fruits and vegetables. Avoid eating fatty foods. °· Do not smoke. °· Avoid drinking alcohol. °· Exercise every day. °· Lose weight if recommended by your caregiver. °· Eat plenty of calcium and folate. Foods that are rich in calcium include milk, cheese, and broccoli. Foods that are rich in folate include chickpeas, kidney beans, and spinach. °HOME CARE INSTRUCTIONS °Keep all follow-up appointments as directed by your caregiver. You may need periodic exams to check for polyps. °SEEK MEDICAL CARE IF: °You notice bleeding during a bowel movement. °Document Released: 06/04/2004 Document Revised: 12/01/2011 Document Reviewed: 11/18/2011 °ExitCare® Patient Information ©2015 [employer]. This information is not intended to replace advice given to you by your health care provider. Make sure you discuss any questions you have with your health care provider. ° °

## 2015-03-23 ENCOUNTER — Encounter (HOSPITAL_COMMUNITY): Payer: Self-pay | Admitting: Internal Medicine

## 2015-03-28 ENCOUNTER — Other Ambulatory Visit: Payer: Self-pay | Admitting: Family Medicine

## 2015-05-01 ENCOUNTER — Encounter: Payer: Commercial Managed Care - HMO | Admitting: Family Medicine

## 2015-05-30 ENCOUNTER — Encounter: Payer: Self-pay | Admitting: Family Medicine

## 2015-05-30 ENCOUNTER — Ambulatory Visit (INDEPENDENT_AMBULATORY_CARE_PROVIDER_SITE_OTHER): Payer: Commercial Managed Care - HMO | Admitting: Family Medicine

## 2015-05-30 VITALS — BP 132/86 | Ht 70.5 in | Wt 238.0 lb

## 2015-05-30 DIAGNOSIS — Z125 Encounter for screening for malignant neoplasm of prostate: Secondary | ICD-10-CM | POA: Diagnosis not present

## 2015-05-30 DIAGNOSIS — E785 Hyperlipidemia, unspecified: Secondary | ICD-10-CM

## 2015-05-30 DIAGNOSIS — Z0000 Encounter for general adult medical examination without abnormal findings: Secondary | ICD-10-CM

## 2015-05-30 DIAGNOSIS — I10 Essential (primary) hypertension: Secondary | ICD-10-CM | POA: Diagnosis not present

## 2015-05-30 MED ORDER — BENAZEPRIL HCL 40 MG PO TABS: 40.0000 mg | ORAL_TABLET | Freq: Two times a day (BID) | ORAL | Status: DC

## 2015-05-30 MED ORDER — ATENOLOL 50 MG PO TABS: 50.0000 mg | ORAL_TABLET | Freq: Two times a day (BID) | ORAL | Status: DC

## 2015-05-30 MED ORDER — GEMFIBROZIL 600 MG PO TABS: ORAL_TABLET | ORAL | Status: DC

## 2015-05-30 MED ORDER — AMLODIPINE BESYLATE 10 MG PO TABS: 10.0000 mg | ORAL_TABLET | Freq: Every day | ORAL | Status: DC

## 2015-05-30 NOTE — Progress Notes (Signed)
   Subjective:    Patient ID: Hector [name], male    DOB: 01-10-1941, 74 y.o.   MRN: [id number]  HPI  AWV- Annual Wellness Visit  The patient was seen for their annual wellness visit. The patient's past medical history, surgical history, and family history were reviewed. Pertinent vaccines were reviewed ( tetanus, pneumonia, shingles, flu) The patient's medication list was reviewed and updated.  The height and weight were entered. The patient's current BMI is: 33.67  Cognitive screening was completed. Outcome of Mini - Cog: passed  Falls within the past 6 months:none  Current tobacco usage: none (All patients who use tobacco were given written and verbal information on quitting)  Recent listing of emergency department/hospitalizations over the past year were reviewed.  current specialist the patient sees on a regular basis: none   Medicare annual wellness visit patient questionnaire was reviewed.  A written screening schedule for the patient for the next 5-10 years was given. Appropriate discussion of followup regarding next visit was discussed.  Last Colonoscopy- 11/21/2014  Flu shot person he would get elsewhere.  Sticking with bp meds faithfully. Watching salt intake. Does not miss a dose. Compliant with medication.  Compliant with lipid medicine. Uncertain of status. Watching fat in diet does not miss a dose meds. No obvious side effects.  130 over 82    Review of Systems  Constitutional: Negative for fever, activity change and appetite change.  HENT: Negative for congestion and rhinorrhea.   Eyes: Negative for discharge.  Respiratory: Negative for cough and wheezing.   Cardiovascular: Negative for chest pain.  Gastrointestinal: Negative for vomiting, abdominal pain and blood in stool.  Genitourinary: Negative for frequency and difficulty urinating.  Musculoskeletal: Negative for neck pain.  Skin: Negative for rash.  Allergic/Immunologic: Negative for  environmental allergies and food allergies.  Neurological: Negative for weakness and headaches.  Psychiatric/Behavioral: Negative for agitation.  All other systems reviewed and are negative.      Objective:   Physical Exam  Constitutional: He appears well-developed and well-nourished.  HENT:  Head: Normocephalic and atraumatic.  Right Ear: External ear normal.  Left Ear: External ear normal.  Nose: Nose normal.  Mouth/Throat: Oropharynx is clear and moist.  Eyes: EOM are normal. Pupils are equal, round, and reactive to light.  Neck: Normal range of motion. Neck supple. No thyromegaly present.  Cardiovascular: Normal rate, regular rhythm and normal heart sounds.   No murmur heard. Pulmonary/Chest: Effort normal and breath sounds normal. No respiratory distress. He has no wheezes.  Abdominal: Soft. Bowel sounds are normal. He exhibits no distension and no mass. There is no tenderness.  Genitourinary: Penis normal.  Musculoskeletal: Normal range of motion. He exhibits no edema.  Lymphadenopathy:    He has no cervical adenopathy.  Neurological: He is alert. He exhibits normal muscle tone.  Skin: Skin is warm and dry. No erythema.  Psychiatric: He has a normal mood and affect. His behavior is normal. Judgment normal.  Vitals reviewed.         Assessment & Plan:  Impression wellness exam. Exercising regularly. Up-to-date on colonoscopy. Due to get a flu shot this fall #2 hypertension good control discussed #3 hyperlipidemia status uncertain discuss plan appropriate blood work. Diet exercise discussed. Medications refilled. Recheck in 6 months. WSL

## 2015-06-03 ENCOUNTER — Encounter: Payer: Self-pay | Admitting: Family Medicine

## 2015-08-13 ENCOUNTER — Telehealth: Payer: Self-pay | Admitting: Family Medicine

## 2015-08-13 NOTE — Telephone Encounter (Signed)
error 

## 2015-08-14 ENCOUNTER — Encounter: Payer: Self-pay | Admitting: Family Medicine

## 2015-08-14 ENCOUNTER — Ambulatory Visit (INDEPENDENT_AMBULATORY_CARE_PROVIDER_SITE_OTHER): Payer: Commercial Managed Care - HMO | Admitting: Family Medicine

## 2015-08-14 VITALS — BP 142/86 | Ht 70.5 in | Wt 240.0 lb

## 2015-08-14 DIAGNOSIS — M545 Low back pain: Secondary | ICD-10-CM

## 2015-08-14 DIAGNOSIS — M1990 Unspecified osteoarthritis, unspecified site: Secondary | ICD-10-CM | POA: Diagnosis not present

## 2015-08-14 NOTE — Progress Notes (Signed)
   Subjective:    Patient ID: Hector [name], male    DOB: January 15, 1941, 74 y.o.   MRN: [id number] Patient arrives office with 3 distinct musculoskeletal concerns.   Back Pain This is a chronic problem. The current episode started more than 1 year ago. The pain is present in the lumbar spine (right side). The symptoms are aggravated by twisting. Treatments tried: aleve.  Fam hx of disc problems,  Back pain, hurting for the last couple yrs again. Back hurts and achey after sust ained standing. Particularly radiating into the thighs.  Also has acute back pain. 3 days duration. Picked up something heavy. Developed a deep ache and low back. Worse with certain motions spasm type sensation achy sharp pain  Seems to be worsening ,  Electric signal s thru the back, last three or four d,m progressivley hurting and was having trouble xleeping Was somewhat incer in activity lately   Right knee pain. Ongoing for years. Has had surgery on both knees.  spmewhat uncomfortable at tinmes, gets aching at times, when sitting for awhile, hurts like heck, painful with certain motions.   Takes aleave one at night and one in morn at times, 12 hrs apart, came off to prn recdntly and needed to resume   Review of Systems  Musculoskeletal: Positive for back pain.   ROS otherwise negative    Objective:   Physical Exam Alert vital stable. HEENT normal. Lungs clear. Heart regular rate and rhythm. Low back positive tenderness to percussion negative straight leg raise right knee substantial crepitations       Assessment & Plan:  Impression very long discussion held 25 minutes most in discussion #1 acute lumbar strain #2 chronic low back pain with worsening with sustained standing some symptoms reminiscent of mild spinal stenosis discussed the low weighted through the status can patient wishes to hold off #3 chronic knee pain worsening history of arthroscopic surgery patient wishes to hold off on major workup  plan Cipro lead to 2 daily. Local back exercises discussed WSL

## 2015-09-04 DIAGNOSIS — Z0000 Encounter for general adult medical examination without abnormal findings: Secondary | ICD-10-CM | POA: Diagnosis not present

## 2015-09-04 DIAGNOSIS — Z125 Encounter for screening for malignant neoplasm of prostate: Secondary | ICD-10-CM | POA: Diagnosis not present

## 2015-09-05 LAB — BASIC METABOLIC PANEL
BUN/Creatinine Ratio: 14 (ref 10–22)
BUN: 15 mg/dL (ref 8–27)
CALCIUM: 9 mg/dL (ref 8.6–10.2)
CO2: 23 mmol/L (ref 18–29)
CREATININE: 1.09 mg/dL (ref 0.76–1.27)
Chloride: 98 mmol/L (ref 96–106)
GFR calc Af Amer: 78 mL/min/{1.73_m2} (ref >59)
GFR calc non Af Amer: 67 mL/min/{1.73_m2} (ref >59)
Glucose: 129 mg/dL — ABNORMAL HIGH (ref 65–99)
Potassium: 4.2 mmol/L (ref 3.5–5.2)
Sodium: 138 mmol/L (ref 134–144)

## 2015-09-05 LAB — LIPID PANEL
Chol/HDL Ratio: 4.7 ratio units (ref 0.0–5.0)
Cholesterol, Total: 103 mg/dL (ref 100–199)
HDL: 22 mg/dL — AB (ref >39)
TRIGLYCERIDES: 402 mg/dL — AB (ref 0–149)

## 2015-09-05 LAB — HEPATIC FUNCTION PANEL
ALBUMIN: 4.4 g/dL (ref 3.5–4.8)
ALK PHOS: 55 IU/L (ref 39–117)
ALT: 33 IU/L (ref 0–44)
AST: 29 IU/L (ref 0–40)
Bilirubin Total: 0.2 mg/dL (ref 0.0–1.2)
Bilirubin, Direct: 0.11 mg/dL (ref 0.00–0.40)
TOTAL PROTEIN: 7 g/dL (ref 6.0–8.5)

## 2015-09-05 LAB — PSA: Prostate Specific Ag, Serum: 2.6 ng/mL (ref 0.0–4.0)

## 2015-10-08 ENCOUNTER — Ambulatory Visit: Payer: Commercial Managed Care - HMO | Admitting: Family Medicine

## 2015-11-26 ENCOUNTER — Encounter: Payer: Self-pay | Admitting: Family Medicine

## 2015-11-26 ENCOUNTER — Ambulatory Visit (INDEPENDENT_AMBULATORY_CARE_PROVIDER_SITE_OTHER): Payer: Commercial Managed Care - HMO | Admitting: Family Medicine

## 2015-11-26 VITALS — BP 132/80 | Ht 70.5 in | Wt 241.8 lb

## 2015-11-26 DIAGNOSIS — R7301 Impaired fasting glucose: Secondary | ICD-10-CM | POA: Diagnosis not present

## 2015-11-26 DIAGNOSIS — M1990 Unspecified osteoarthritis, unspecified site: Secondary | ICD-10-CM | POA: Diagnosis not present

## 2015-11-26 DIAGNOSIS — R739 Hyperglycemia, unspecified: Secondary | ICD-10-CM | POA: Diagnosis not present

## 2015-11-26 DIAGNOSIS — I10 Essential (primary) hypertension: Secondary | ICD-10-CM

## 2015-11-26 DIAGNOSIS — E785 Hyperlipidemia, unspecified: Secondary | ICD-10-CM

## 2015-11-26 LAB — POCT GLYCOSYLATED HEMOGLOBIN (HGB A1C): HEMOGLOBIN A1C: 6.1

## 2015-11-26 MED ORDER — BENAZEPRIL HCL 40 MG PO TABS: 40.0000 mg | ORAL_TABLET | Freq: Two times a day (BID) | ORAL | Status: DC

## 2015-11-26 MED ORDER — GEMFIBROZIL 600 MG PO TABS: ORAL_TABLET | ORAL | Status: DC

## 2015-11-26 MED ORDER — AMLODIPINE BESYLATE 10 MG PO TABS: 10.0000 mg | ORAL_TABLET | Freq: Every day | ORAL | Status: DC

## 2015-11-26 MED ORDER — ATENOLOL 50 MG PO TABS: 50.0000 mg | ORAL_TABLET | Freq: Two times a day (BID) | ORAL | Status: DC

## 2015-11-26 NOTE — Progress Notes (Signed)
   Subjective:    Patient ID: Hector [name], male    DOB: 09-04-1941, 75 y.o.   MRN: [id number]  Hypertension This is a chronic problem. The current episode started more than 1 year ago. Risk factors for coronary artery disease include dyslipidemia. Treatments tried: norvasc, atenolol, lotensin.   compliant blood pressure medication. No obvious side effects. Meds reviewed today.  Patient expressing worsening right knee pain. See prior notes. Known arthritis. Positive history arthroscopic surgery. Would like referral. Having a lot of difficulty getting around with his knee. Next  Claims compliance with lipid medication. Trying to watch diet has cut down fats in diet.  Had blood work in December which revealed an elevated sugar. Part of his recent to be here today is to check an A1c to check status admits to glucose high diet at times Pt is watching diet and trying to eat better,  Compliant with bp meds.BP numbers geernally 140 over 82 etc.  [phone]   Review of Systems No headache, no major weight loss or weight gain, no chest pain no back pain abdominal pain no change in bowel habits complete ROS otherwise negative     Objective:   Physical Exam Alert vitals stable blood pressure excellent on repeat HEENT normal lungs clear heart rare rhythm right knee impressive crepitations, mild swollen       Assessment & Plan:   Impression 1 hypertension good control meds reviewed to maintain same #2 hyperlipidemia good control meds reviewed to maintain same #3 impaired fasting glucose discussed at length. A1c today fortunately 6.1. Therefore not diabetes had patient relieved to hear this #4 progressive right knee arthritis plan orthopedic referral. Medicines refilled. Diet exercise discussed. Check in 6 months for wellness plus chronic visit [facility]

## 2015-12-27 ENCOUNTER — Telehealth: Payer: Self-pay | Admitting: Family Medicine

## 2015-12-27 ENCOUNTER — Other Ambulatory Visit: Payer: Self-pay | Admitting: *Deleted

## 2015-12-27 DIAGNOSIS — M25561 Pain in right knee: Secondary | ICD-10-CM

## 2015-12-27 NOTE — Telephone Encounter (Signed)
Order for referral put in.  

## 2015-12-27 NOTE — Telephone Encounter (Signed)
Let's do 

## 2015-12-27 NOTE — Telephone Encounter (Signed)
Pt LMOM for me to check on his ortho referral, was seen 11/26/15, looked in chart and referral was never put in, please initiate referral so I may process Pt mentioned Dr. Hilda LiasKeeling in message he left on my voicemail

## 2015-12-31 ENCOUNTER — Encounter: Payer: Self-pay | Admitting: Family Medicine

## 2016-02-04 ENCOUNTER — Ambulatory Visit (INDEPENDENT_AMBULATORY_CARE_PROVIDER_SITE_OTHER): Payer: Commercial Managed Care - HMO

## 2016-02-04 ENCOUNTER — Ambulatory Visit (INDEPENDENT_AMBULATORY_CARE_PROVIDER_SITE_OTHER): Payer: Commercial Managed Care - HMO | Admitting: Orthopedic Surgery

## 2016-02-04 VITALS — BP 182/77 | HR 51 | Ht 71.0 in | Wt 242.0 lb

## 2016-02-04 DIAGNOSIS — M25561 Pain in right knee: Secondary | ICD-10-CM

## 2016-02-04 NOTE — Progress Notes (Signed)
Chief Complaint  Patient presents with  . Knee Pain    Right knee pain    HPI  75 years old he's had 2 procedures on the right knee over the years he's had 3 years of pain with catching stiffness giving way described as sharp constant 8 out of 10 but his main problem is that he has trouble going down the stairs but not up  Review of systems back pain stiff joints joint pain burning in his legs hearing loss  ROS As history of present illness   Past Medical History  Diagnosis Date  . Hypertension   . Hyperlipidemia   . Obesity   . ED (erectile dysfunction)   . IFG (impaired fasting glucose)   . Noncompliance   . Chronic hip pain     LEFT HIP  . Arthritis     PAIN AND OA LEFT HIP; ARTHRITIS BOTH KNEES    Past Surgical History  Procedure Laterality Date  . Hip fracture surgery  2009    LEFT HIP  . Refractive surgery Bilateral     LASIK  . Knee arthrotomy bilateral 50 + yrs ago    . Bilateral knee arthroscopy    . Cholecystectomy  2010  . Total hip arthroplasty Left 07/24/2014    Procedure: CONVERSION OF PREVIOUS HIP SURGERY LEFT HIP ARTHROPLASTY;  Surgeon: Shelda PalMatthew D Olin, MD;  Location: [facility];  Service: Orthopedics;  Laterality: Left;  . Colonoscopy N/A 03/21/2015    Procedure: COLONOSCOPY;  Surgeon: Corbin Adeobert M Rourk, MD;  Location: [facility];  Service: Endoscopy;  Laterality: N/A;  9:30 AM   Family History  Problem Relation Age of Onset  . Hypertension Mother   . Diabetes Mother   . Hypertension Father   . Heart attack Father   . Diabetes Sister    Social History  Substance Use Topics  . Smoking status: Former Games developermoker  . Smokeless tobacco: Never Used     Comment: quit 40-50 years ago  . Alcohol Use: No     Comment: QUIT SMOKING AGE 78    Current outpatient prescriptions:  .  amLODipine (NORVASC) 10 MG tablet, Take 1 tablet (10 mg total) by mouth daily., Disp: 90 tablet, Rfl: 1 .  aspirin EC 325 MG tablet, Take 325 mg by mouth daily., Disp: , Rfl:  .   atenolol (TENORMIN) 50 MG tablet, Take 1 tablet (50 mg total) by mouth 2 (two) times daily., Disp: 180 tablet, Rfl: 1 .  benazepril (LOTENSIN) 40 MG tablet, Take 1 tablet (40 mg total) by mouth 2 (two) times daily., Disp: 180 tablet, Rfl: 1 .  gemfibrozil (LOPID) 600 MG tablet, TAKE 1 TABLET TWICE DAILY BEFORE MEALS, Disp: 180 tablet, Rfl: 1  BP 182/77 mmHg  Pulse 51  Ht 5\' 11"  (1.803 m)  Wt 242 lb (109.77 kg)  BMI 33.77 kg/m2  Physical Exam  Constitutional: He is oriented to person, place, and time. He appears well-developed and well-nourished. No distress.  Cardiovascular: Normal rate and intact distal pulses.   Musculoskeletal:       Right knee: He exhibits effusion.  Neurological: He is alert and oriented to person, place, and time.  Skin: Skin is warm and dry. No rash noted. He is not diaphoretic. No erythema. No pallor.  Psychiatric: He has a normal mood and affect. His behavior is normal. Judgment and thought content normal.    Right Knee Exam   Tenderness  The patient is experiencing tenderness in the medial joint  line (weakness right quads ).  Range of Motion  Extension: 5  Right knee flexion: 125.   Tests  McMurray:  Medial - negative Lateral - negative Drawer:       Anterior - negative    Posterior - negative Varus: negative Valgus: negative  Other  Erythema: absent Scars: present Sensation: normal Pulse: present Other tests: effusion present   Left Knee Exam   Tenderness  The patient is experiencing no tenderness.     Range of Motion  Extension: normal  Flexion: normal   Muscle Strength   The patient has normal left knee strength.  Tests  McMurray:  Medial - negative Lateral - negative Drawer:       Anterior - negative     Posterior - negative Varus: negative Valgus: negative  Other  Erythema: absent Scars: present Sensation: normal Pulse: present Swelling: none       ASSESSMENT: My personal interpretation of the images:   Severe degenerative arthritis right knee     PLAN Injection  PHYS THERAPY right knee  F/u 2 months   Procedure note right knee injection verbal consent was obtained to inject right knee joint  Timeout was completed to confirm the site of injection  The medications used were 40 mg of Depo-Medrol and 1% lidocaine 3 cc  Anesthesia was provided by ethyl chloride and the skin was prepped with alcohol.  After cleaning the skin with alcohol a 20-gauge needle was used to inject the right knee joint. There were no complications. A sterile bandage was applied.

## 2016-02-04 NOTE — Patient Instructions (Signed)
CALL [facility] TO SCHEDULE THERAPY VISIT [phone]

## 2016-04-07 ENCOUNTER — Ambulatory Visit (INDEPENDENT_AMBULATORY_CARE_PROVIDER_SITE_OTHER): Payer: Commercial Managed Care - HMO | Admitting: Orthopedic Surgery

## 2016-04-07 ENCOUNTER — Encounter: Payer: Self-pay | Admitting: Orthopedic Surgery

## 2016-04-07 VITALS — BP 167/80 | Ht 71.75 in | Wt 242.0 lb

## 2016-04-07 DIAGNOSIS — M25561 Pain in right knee: Secondary | ICD-10-CM | POA: Diagnosis not present

## 2016-04-07 DIAGNOSIS — M129 Arthropathy, unspecified: Secondary | ICD-10-CM

## 2016-04-07 DIAGNOSIS — M1711 Unilateral primary osteoarthritis, right knee: Secondary | ICD-10-CM

## 2016-04-07 NOTE — Progress Notes (Signed)
Patient ID: Hector [name], male   DOB: 04/16/1941, 75 y.o.   MRN: [id number]  Chief Complaint  Patient presents with  . Follow-up    Right knee    HPI 75 year old status post 2 procedures and the knee one in each knee with clean out.  I gave him an injection in his right knee he did well for about a week and a half comes back still complaining of difficulty going up and down the stairs and moderate nonradiating medial pain. Occasional catching sensation.  ROS  No fever  No chills  No chest pain or shortness of breath  Past Medical History  Diagnosis Date  . Hypertension   . Hyperlipidemia   . Obesity   . ED (erectile dysfunction)   . IFG (impaired fasting glucose)   . Noncompliance   . Chronic hip pain     LEFT HIP  . Arthritis     PAIN AND OA LEFT HIP; ARTHRITIS BOTH KNEES    BP 167/80 mmHg  Ht 5' 11.75" (1.822 m)  Wt 242 lb (109.77 kg)  BMI 33.07 kg/m2  Physical Exam Physical Exam  Constitutional: The patient appears well-developed and well-nourished. No distress.  The patient is oriented to person, place, and time.  Psychiatric: The patient has a normal mood and affect.  Cardiovascular: Intact distal pulses. Both legs  Neurological: sensation is normal both legs Skin: Right left knee Skin is warm and dry. No rash noted. The patient is not diaphoretic. No erythema. No pallor.    Ortho Exam  Right knee medial joint line tenderness small flexion contracture knee flexion 120 ligaments stable. No effusion.  Left knee nontender no effusion small flexion contracture knee flexion 125 ligaments stable no effusion  ASSESSMENT AND PLAN   Continue pain from osteoarthritis right knee  Recommend injection  Patient will go to physical therapy follow-up 2 months  Procedure note right knee injection verbal consent was obtained to inject right knee joint  Timeout was completed to confirm the site of injection  The medications used were 40 mg of Depo-Medrol and  1% lidocaine 3 cc  Anesthesia was provided by ethyl chloride and the skin was prepped with alcohol.  After cleaning the skin with alcohol a 20-gauge needle was used to inject the right knee joint. There were no complications. A sterile bandage was applied.  Fuller CanadaStanley Harrison, MD 04/07/2016 8:47 AM

## 2016-04-11 ENCOUNTER — Other Ambulatory Visit: Payer: Self-pay | Admitting: Family Medicine

## 2016-05-28 ENCOUNTER — Ambulatory Visit (INDEPENDENT_AMBULATORY_CARE_PROVIDER_SITE_OTHER): Payer: Commercial Managed Care - HMO | Admitting: Family Medicine

## 2016-05-28 ENCOUNTER — Encounter: Payer: Self-pay | Admitting: Family Medicine

## 2016-05-28 VITALS — BP 164/90 | Ht 72.0 in | Wt 238.2 lb

## 2016-05-28 DIAGNOSIS — Z23 Encounter for immunization: Secondary | ICD-10-CM | POA: Diagnosis not present

## 2016-05-28 DIAGNOSIS — Z0000 Encounter for general adult medical examination without abnormal findings: Secondary | ICD-10-CM

## 2016-05-28 DIAGNOSIS — Z139 Encounter for screening, unspecified: Secondary | ICD-10-CM | POA: Diagnosis not present

## 2016-05-28 DIAGNOSIS — E785 Hyperlipidemia, unspecified: Secondary | ICD-10-CM | POA: Diagnosis not present

## 2016-05-28 DIAGNOSIS — R7301 Impaired fasting glucose: Secondary | ICD-10-CM

## 2016-05-28 DIAGNOSIS — I10 Essential (primary) hypertension: Secondary | ICD-10-CM | POA: Diagnosis not present

## 2016-05-28 MED ORDER — AMLODIPINE BESYLATE 10 MG PO TABS: 10.0000 mg | ORAL_TABLET | Freq: Every day | ORAL | 1 refills | Status: DC

## 2016-05-28 MED ORDER — BENAZEPRIL HCL 40 MG PO TABS
40.0000 mg | ORAL_TABLET | Freq: Two times a day (BID) | ORAL | 1 refills | Status: DC
Start: 2016-05-28 — End: 2016-11-25

## 2016-05-28 MED ORDER — METOPROLOL TARTRATE 50 MG PO TABS: 50.0000 mg | ORAL_TABLET | Freq: Two times a day (BID) | ORAL | 1 refills | Status: DC

## 2016-05-28 MED ORDER — GEMFIBROZIL 600 MG PO TABS: ORAL_TABLET | ORAL | 1 refills | Status: DC

## 2016-05-28 NOTE — Progress Notes (Signed)
Subjective:    Patient ID: Hector [name], male    DOB: 1941-02-09, 75 y.o.   MRN: [id number]  HPI AWV- Annual Wellness Visit  The patient was seen for their annual wellness visit. The patient's past medical history, surgical history, and family history were reviewed. Pertinent vaccines were reviewed ( tetanus, pneumonia, shingles, flu) The patient's medication list was reviewed and updated.  The height and weight were entered. The patient's current BMI is:  Cognitive screening was completed. Outcome of Mini - Cog: Pass  Falls within the past 6 months:None  Current tobacco usage:  None  (All patients who use tobacco were given written and verbal information on quitting)  Recent listing of emergency department/hospitalizations over the past year were reviewed.  current specialist the patient sees on a regular basis: None   Last colonoscopy last yr  Medicare annual wellness visit patient questionnaire was reviewed.  A written screening schedule for the patient for the next 5-10 years was given. Appropriate discussion of followup regarding next visit was discussed.  Results for orders placed or performed in visit on 11/26/15  POCT glycosylated hemoglobin (Hb A1C)  Result Value Ref Range   Hemoglobin A1C 6.1    Taking all med reg  Blood pressure medicine and blood pressure levels reviewed today with patient. Compliant with blood pressure medicine. States does not miss a dose. No obvious side effects. Blood pressure generally good when checked elsewhere. Watching salt intake.  Patient continues to take lipid medication regularly. No obvious side effects from it. Generally does not miss a dose. Prior blood work results are reviewed with patient. Patient continues to work on fat intake in diet   Patient states no other concerns this visit.   Review of Systems  Constitutional: Negative for activity change, appetite change and fever.  HENT: Negative for congestion and  rhinorrhea.   Eyes: Negative for discharge.  Respiratory: Negative for cough and wheezing.   Cardiovascular: Negative for chest pain.  Gastrointestinal: Negative for abdominal pain, blood in stool and vomiting.  Genitourinary: Negative for difficulty urinating and frequency.  Musculoskeletal: Negative for neck pain.  Skin: Negative for rash.  Allergic/Immunologic: Negative for environmental allergies and food allergies.  Neurological: Negative for weakness and headaches.  Psychiatric/Behavioral: Negative for agitation.  All other systems reviewed and are negative.      Objective:   Physical Exam  Constitutional: He appears well-developed and well-nourished.  HENT:  Head: Normocephalic and atraumatic.  Right Ear: External ear normal.  Left Ear: External ear normal.  Nose: Nose normal.  Mouth/Throat: Oropharynx is clear and moist.  Eyes: EOM are normal. Pupils are equal, round, and reactive to light.  Neck: Normal range of motion. Neck supple. No thyromegaly present.  Cardiovascular: Normal rate, regular rhythm and normal heart sounds.   No murmur heard. Pulmonary/Chest: Effort normal and breath sounds normal. No respiratory distress. He has no wheezes.  Abdominal: Soft. Bowel sounds are normal. He exhibits no distension and no mass. There is no tenderness.  Genitourinary: Penis normal.  Musculoskeletal: Normal range of motion. He exhibits no edema.  Lymphadenopathy:    He has no cervical adenopathy.  Neurological: He is alert. He exhibits normal muscle tone.  Skin: Skin is warm and dry. No erythema.  Psychiatric: He has a normal mood and affect. His behavior is normal. Judgment normal.  Vitals reviewed.         Assessment & Plan:   #1 well adult exam #2 hyperlipidemia discuss exact control uncertain #  3 hypertension good control discussed #4 prediabetes clinically stable plan diet exercise discussed. Flu shot today. Appropriate blood work. Recheck in 6 months. Medications  refilled. Up-to-date on colonoscopy

## 2016-05-29 LAB — HEPATIC FUNCTION PANEL
ALK PHOS: 54 IU/L (ref 39–117)
ALT: 33 IU/L (ref 0–44)
AST: 30 IU/L (ref 0–40)
Albumin: 4.8 g/dL (ref 3.5–4.8)
Bilirubin Total: 0.7 mg/dL (ref 0.0–1.2)
Bilirubin, Direct: 0.18 mg/dL (ref 0.00–0.40)
Total Protein: 7.6 g/dL (ref 6.0–8.5)

## 2016-05-29 LAB — BASIC METABOLIC PANEL
BUN/Creatinine Ratio: 15 (ref 10–24)
BUN: 18 mg/dL (ref 8–27)
CO2: 22 mmol/L (ref 18–29)
Calcium: 10 mg/dL (ref 8.6–10.2)
Chloride: 102 mmol/L (ref 96–106)
Creatinine, Ser: 1.17 mg/dL (ref 0.76–1.27)
GFR calc Af Amer: 71 mL/min/{1.73_m2} (ref >59)
GFR calc non Af Amer: 61 mL/min/{1.73_m2} (ref >59)
GLUCOSE: 115 mg/dL — AB (ref 65–99)
POTASSIUM: 4.8 mmol/L (ref 3.5–5.2)
Sodium: 141 mmol/L (ref 134–144)

## 2016-05-29 LAB — LIPID PANEL
CHOLESTEROL TOTAL: 127 mg/dL (ref 100–199)
Chol/HDL Ratio: 4.7 ratio units (ref 0.0–5.0)
HDL: 27 mg/dL — AB (ref >39)
LDL Calculated: 42 mg/dL (ref 0–99)
Triglycerides: 291 mg/dL — ABNORMAL HIGH (ref 0–149)
VLDL CHOLESTEROL CAL: 58 mg/dL — AB (ref 5–40)

## 2016-05-29 LAB — PSA: PROSTATE SPECIFIC AG, SERUM: 2.3 ng/mL (ref 0.0–4.0)

## 2016-05-29 LAB — HEMOGLOBIN A1C
Est. average glucose Bld gHb Est-mCnc: 128 mg/dL
HEMOGLOBIN A1C: 6.1 % — AB (ref 4.8–5.6)

## 2016-06-01 ENCOUNTER — Encounter: Payer: Self-pay | Admitting: Family Medicine

## 2016-06-09 ENCOUNTER — Ambulatory Visit: Payer: Commercial Managed Care - HMO | Admitting: Orthopedic Surgery

## 2016-06-09 ENCOUNTER — Encounter: Payer: Self-pay | Admitting: Orthopedic Surgery

## 2016-07-03 ENCOUNTER — Encounter: Payer: Self-pay | Admitting: Nurse Practitioner

## 2016-07-03 ENCOUNTER — Ambulatory Visit (INDEPENDENT_AMBULATORY_CARE_PROVIDER_SITE_OTHER): Payer: Commercial Managed Care - HMO | Admitting: Nurse Practitioner

## 2016-07-03 VITALS — BP 136/82 | Wt 243.6 lb

## 2016-07-03 DIAGNOSIS — M109 Gout, unspecified: Secondary | ICD-10-CM

## 2016-07-03 MED ORDER — COLCHICINE 0.6 MG PO TABS: ORAL_TABLET | ORAL | 0 refills | Status: DC

## 2016-07-04 ENCOUNTER — Encounter: Payer: Self-pay | Admitting: Nurse Practitioner

## 2016-07-04 NOTE — Progress Notes (Signed)
Subjective:  Presents for complaints of probable gout in his right great toe that began on 07/02/16. Very tender. Swelling and tenderness both have improved over the past few days. Has been drinking cherry juice. Also using Tumeric. No other joint involvement. No fever. No history of recurrent gout. No specific dietary triggers noted except a significant increase in red meat intake over the past week.  Objective:   BP 136/82   Wt 243 lb 9.6 oz (110.5 kg)   BMI 33.04 kg/m  NAD. Alert, oriented. Lungs clear. Heart regular rate rhythm. Mild edema and mild erythema noted at the podagra of the right foot. Moderately tender to palpation.  Assessment:  Problem List Items Addressed This Visit    None    Visit Diagnoses    Acute gout involving toe of right foot, unspecified cause    -  Primary   Relevant Medications   colchicine 0.6 MG tablet     Plan:  Meds ordered this encounter  Medications  . colchicine 0.6 MG tablet    Sig: 2 tabs po then one tab po one hour later    Dispense:  3 tablet    Refill:  0    Order Specific Question:   Supervising Provider    Answer:   Riccardo DubinLUKING, WILLIAM S [[id number]]   Expect continued resolution of symptoms. Call back in 4-5 days if no improvement, sooner if worse. Also call back if symptoms recur. Otherwise no further workup at this point.

## 2016-07-16 ENCOUNTER — Telehealth: Payer: Self-pay | Admitting: Family Medicine

## 2016-07-16 DIAGNOSIS — Z0100 Encounter for examination of eyes and vision without abnormal findings: Secondary | ICD-10-CM

## 2016-07-16 NOTE — Telephone Encounter (Signed)
If referral required

## 2016-07-16 NOTE — Addendum Note (Signed)
Addended by: Margaretha SheffieldBROWN, Travia Onstad S on: 07/16/2016 05:21 PM   Modules accepted: Orders

## 2016-07-16 NOTE — Telephone Encounter (Signed)
Pt is requesting a referral to Dr. Ashley RoyaltyShapiro's office for an eye exam.

## 2016-07-16 NOTE — Telephone Encounter (Signed)
Referral ordered in EPIC. 

## 2016-08-20 DIAGNOSIS — H52203 Unspecified astigmatism, bilateral: Secondary | ICD-10-CM | POA: Diagnosis not present

## 2016-08-20 DIAGNOSIS — H2513 Age-related nuclear cataract, bilateral: Secondary | ICD-10-CM | POA: Diagnosis not present

## 2016-08-20 DIAGNOSIS — H5203 Hypermetropia, bilateral: Secondary | ICD-10-CM | POA: Diagnosis not present

## 2016-08-20 DIAGNOSIS — H43811 Vitreous degeneration, right eye: Secondary | ICD-10-CM | POA: Diagnosis not present

## 2016-09-25 DIAGNOSIS — T84021A Dislocation of internal left hip prosthesis, initial encounter: Secondary | ICD-10-CM | POA: Diagnosis not present

## 2016-09-25 DIAGNOSIS — I10 Essential (primary) hypertension: Secondary | ICD-10-CM | POA: Diagnosis not present

## 2016-09-25 DIAGNOSIS — S73005A Unspecified dislocation of left hip, initial encounter: Secondary | ICD-10-CM | POA: Diagnosis not present

## 2016-09-25 DIAGNOSIS — X501XXA Overexertion from prolonged static or awkward postures, initial encounter: Secondary | ICD-10-CM | POA: Diagnosis not present

## 2016-11-25 ENCOUNTER — Ambulatory Visit (INDEPENDENT_AMBULATORY_CARE_PROVIDER_SITE_OTHER): Payer: Medicare HMO | Admitting: Family Medicine

## 2016-11-25 ENCOUNTER — Encounter: Payer: Self-pay | Admitting: Family Medicine

## 2016-11-25 VITALS — BP 130/78 | Ht 72.0 in | Wt 230.2 lb

## 2016-11-25 DIAGNOSIS — E1169 Type 2 diabetes mellitus with other specified complication: Secondary | ICD-10-CM | POA: Diagnosis not present

## 2016-11-25 DIAGNOSIS — I10 Essential (primary) hypertension: Secondary | ICD-10-CM | POA: Diagnosis not present

## 2016-11-25 DIAGNOSIS — E785 Hyperlipidemia, unspecified: Secondary | ICD-10-CM

## 2016-11-25 DIAGNOSIS — R7301 Impaired fasting glucose: Secondary | ICD-10-CM | POA: Diagnosis not present

## 2016-11-25 MED ORDER — AMLODIPINE BESYLATE 10 MG PO TABS: 10.0000 mg | ORAL_TABLET | Freq: Every day | ORAL | 1 refills | Status: DC

## 2016-11-25 MED ORDER — BENAZEPRIL HCL 40 MG PO TABS: 40.0000 mg | ORAL_TABLET | Freq: Two times a day (BID) | ORAL | 1 refills | Status: DC

## 2016-11-25 MED ORDER — GEMFIBROZIL 600 MG PO TABS: ORAL_TABLET | ORAL | 1 refills | Status: DC

## 2016-11-25 MED ORDER — METOPROLOL TARTRATE 50 MG PO TABS: 50.0000 mg | ORAL_TABLET | Freq: Two times a day (BID) | ORAL | 1 refills | Status: DC

## 2016-11-25 NOTE — Progress Notes (Signed)
   Subjective:    Patient ID: Hector [name], male    DOB: 1941-02-09, 76 y.o.   MRN: [id number]  Hypertension  This is a chronic problem. The current episode started more than 1 year ago. Risk factors for coronary artery disease include dyslipidemia. Treatments tried: norvasc, benzapril, lopressor. There are no compliance problems.    Blood pressure medicine and blood pressure levels reviewed today with patient. Compliant with blood pressure medicine. States does not miss a dose. No obvious side effects. Blood pressure generally good when checked elsewhere. Watching salt intake.  Patient continues to take lipid medication regularly. No obvious side effects from it. Generally does not miss a dose. Prior blood work results are reviewed with patient. Patient continues to work on fat intake in diet  Staying busy with exrcise  Trying to walk  Half a mile per day, more than in the past, stays fairly active  Had injury January, pt saw his bone doc for hip injury, xrays and therapy was helfful   BP generally good when ck ed elsewhere  Patient has known history of elevated sugar. Positive family history diabetes. Trying to cut down sugars diet. Review of Systems No headache, no major weight loss or weight gain, no chest pain no back pain abdominal pain no change in bowel habits complete ROS otherwise negative     Objective:   Physical Exam Alert vitals stable, NAD. Blood pressure good on repeat. HEENT normal. Lungs clear. Heart regular rate and rhythm.        Assessment & Plan:  Impression 1 hypertension discussed good control maintain same meds #2 hyperlipidemia prior blood work discussed current status uncertain maintain same pending #3 prediabetes new diagnosis discussed long-term implications plan 25 minutes spent greater than 50% in discussion and management and coordination. Medications refilled. Appropriate blood work. Further recommendations based results [facility]

## 2016-11-26 LAB — LIPID PANEL
CHOL/HDL RATIO: 3.7 ratio (ref 0.0–5.0)
Cholesterol, Total: 110 mg/dL (ref 100–199)
HDL: 30 mg/dL — AB (ref >39)
LDL Calculated: 50 mg/dL (ref 0–99)
Triglycerides: 150 mg/dL — ABNORMAL HIGH (ref 0–149)
VLDL CHOLESTEROL CAL: 30 mg/dL (ref 5–40)

## 2016-11-26 LAB — HEPATIC FUNCTION PANEL
ALT: 40 IU/L (ref 0–44)
AST: 30 IU/L (ref 0–40)
Albumin: 4.7 g/dL (ref 3.5–4.8)
Alkaline Phosphatase: 61 IU/L (ref 39–117)
Bilirubin Total: 0.4 mg/dL (ref 0.0–1.2)
Bilirubin, Direct: 0.13 mg/dL (ref 0.00–0.40)
TOTAL PROTEIN: 7.2 g/dL (ref 6.0–8.5)

## 2016-11-26 LAB — GLUCOSE, RANDOM: GLUCOSE: 108 mg/dL — AB (ref 65–99)

## 2016-12-01 ENCOUNTER — Encounter: Payer: Self-pay | Admitting: Family Medicine

## 2017-01-27 ENCOUNTER — Other Ambulatory Visit: Payer: Self-pay | Admitting: Family Medicine

## 2017-03-06 ENCOUNTER — Other Ambulatory Visit: Payer: Self-pay | Admitting: Family Medicine

## 2017-04-01 ENCOUNTER — Encounter: Payer: Self-pay | Admitting: Family Medicine

## 2017-04-01 ENCOUNTER — Ambulatory Visit (INDEPENDENT_AMBULATORY_CARE_PROVIDER_SITE_OTHER): Payer: Medicare HMO | Admitting: Family Medicine

## 2017-04-01 VITALS — BP 134/82 | Temp 98.5°F | Ht 72.0 in | Wt 234.4 lb

## 2017-04-01 DIAGNOSIS — R21 Rash and other nonspecific skin eruption: Secondary | ICD-10-CM

## 2017-04-01 MED ORDER — KETOCONAZOLE 2 % EX CREA: 1.0000 "application " | TOPICAL_CREAM | Freq: Two times a day (BID) | CUTANEOUS | 2 refills | Status: DC

## 2017-04-01 NOTE — Progress Notes (Signed)
   Subjective:    Patient ID: Hector [name], male    DOB: 1941-06-19, 76 y.o.   MRN: [id number]  Rash  This is a new problem. The current episode started 1 to 4 weeks ago. The affected locations include the back. Treatments tried: Bradly BienenstockSarne, Bag Balm    Has tried sarne and bag balm, thought it might help   Did not do much   Seemed to worsen with heat and other irrit  Patient states no other concerns this visit.    Review of Systems  Skin: Positive for rash.   No headache, no major weight loss or weight gain, no chest pain no back pain abdominal pain no change in bowel habits complete ROS otherwise negative     Objective:   Physical Exam Alert vitals stable, NAD. Blood pressure good on repeat. HEENT normal. Lungs clear. Heart regular rate and rhythm. Discrete erythematous rash semicircular in shape quite large covering a portion upper mid back. With leading inflamed is and scaling medicine centralized clearing       Assessment & Plan:  Impression probable tinea corporis discussed at length plan ketoconazole twice a day to affected areas symptom care discussed it persists call back and

## 2017-04-23 ENCOUNTER — Encounter (HOSPITAL_COMMUNITY): Payer: Self-pay

## 2017-04-23 ENCOUNTER — Emergency Department (HOSPITAL_COMMUNITY)
Admission: EM | Admit: 2017-04-23 | Discharge: 2017-04-23 | Disposition: A | Discharge status: Home / Self Care | Payer: Medicare HMO | Attending: Emergency Medicine | Admitting: Emergency Medicine

## 2017-04-23 ENCOUNTER — Emergency Department (HOSPITAL_COMMUNITY): Payer: Medicare HMO

## 2017-04-23 DIAGNOSIS — I10 Essential (primary) hypertension: Secondary | ICD-10-CM | POA: Insufficient documentation

## 2017-04-23 DIAGNOSIS — Z87891 Personal history of nicotine dependence: Secondary | ICD-10-CM | POA: Diagnosis not present

## 2017-04-23 DIAGNOSIS — M436 Torticollis: Secondary | ICD-10-CM

## 2017-04-23 DIAGNOSIS — Z79899 Other long term (current) drug therapy: Secondary | ICD-10-CM | POA: Insufficient documentation

## 2017-04-23 DIAGNOSIS — Z7982 Long term (current) use of aspirin: Secondary | ICD-10-CM | POA: Insufficient documentation

## 2017-04-23 DIAGNOSIS — M542 Cervicalgia: Secondary | ICD-10-CM | POA: Diagnosis not present

## 2017-04-23 MED ORDER — HYDROCODONE-ACETAMINOPHEN 5-325 MG PO TABS: ORAL_TABLET | ORAL | 0 refills | Status: DC

## 2017-04-23 MED ORDER — KETOROLAC TROMETHAMINE 60 MG/2ML IM SOLN
60.0000 mg | Freq: Once | INTRAMUSCULAR | Status: AC
  Administered 2017-04-23: 60 mg via INTRAMUSCULAR
  Filled 2017-04-23: qty 2

## 2017-04-23 MED ORDER — METHOCARBAMOL 500 MG PO TABS: 500.0000 mg | ORAL_TABLET | Freq: Three times a day (TID) | ORAL | 0 refills | Status: DC

## 2017-04-23 MED ORDER — METHOCARBAMOL 500 MG PO TABS
500.0000 mg | ORAL_TABLET | Freq: Once | ORAL | Status: AC
  Administered 2017-04-23: 500 mg via ORAL
  Filled 2017-04-23: qty 1

## 2017-04-23 NOTE — ED Provider Notes (Signed)
[facility] Provider Note   CSN: [id number] Arrival date & time: 04/23/17  1909     History   Chief Complaint Chief Complaint  Patient presents with  . Shoulder Pain    HPI Hector [name] is a 76 y.o. male.  HPI   Hector [name] is a 76 y.o. male who presents to the Emergency Department complaining of right sided neck and shoulder pain for 1-2 days.  He states that earlier this week he developed pain to the left side of his neck that improved after taking ibuprofen and applying an OTC pain patch.  Pain "moved" to the right neck and shoulder and has persisted despite using the same treatments.  Pain is associated with movement of the neck and improves at rest.  He describes a sharp, burning pain from the right side of the neck into the top of the shoulder.  He denies chest pain, jaw pain, shortness of breath, extremity pain or weakness, change in speech, facial droop, visual changes and headaches.  He also complains of a recent tick bite to his right waistline that was removed by him.  No fevers, joint pain, fatigue or rash.     Past Medical History:  Diagnosis Date  . Arthritis    PAIN AND OA LEFT HIP; ARTHRITIS BOTH KNEES  . Chronic hip pain    LEFT HIP  . ED (erectile dysfunction)   . Hyperlipidemia   . Hypertension   . IFG (impaired fasting glucose)   . Noncompliance   . Obesity     Patient Active Problem List   Diagnosis Date Noted  . Diverticulosis of colon without hemorrhage   . S/P hip replacement 07/24/2014  . Essential hypertension, benign 01/18/2013  . Other and unspecified hyperlipidemia 01/18/2013  . Impaired fasting glucose 01/18/2013  . Sensory neuropathy 01/18/2013  . Erectile dysfunction 01/18/2013    Past Surgical History:  Procedure Laterality Date  . BILATERAL KNEE ARTHROSCOPY    . CHOLECYSTECTOMY  2010  . COLONOSCOPY N/A 03/21/2015   Procedure: COLONOSCOPY;  Surgeon: Corbin Adeobert M Rourk, MD;  Location: [facility];  Service: Endoscopy;   Laterality: N/A;  9:30 AM  . HIP FRACTURE SURGERY  2009   LEFT HIP  . KNEE ARTHROTOMY BILATERAL 50 + YRS AGO    . REFRACTIVE SURGERY Bilateral    LASIK  . TOTAL HIP ARTHROPLASTY Left 07/24/2014   Procedure: CONVERSION OF PREVIOUS HIP SURGERY LEFT HIP ARTHROPLASTY;  Surgeon: Shelda PalMatthew D Olin, MD;  Location: [facility];  Service: Orthopedics;  Laterality: Left;       Home Medications    Prior to Admission medications   Medication Sig Start Date End Date Taking? Authorizing Provider  amLODipine (NORVASC) 10 MG tablet TAKE 1 TABLET EVERY DAY 03/09/17   Babs SciaraLuking, Scott A, MD  aspirin EC 325 MG tablet Take 325 mg by mouth daily.    [provider]  benazepril (LOTENSIN) 40 MG tablet TAKE 1 TABLET TWICE DAILY 01/28/17   Merlyn AlbertLuking, William S, MD  colchicine 0.6 MG tablet 2 tabs po then one tab po one hour later 07/03/16   Campbell RichesHoskins, Carolyn C, NP  gemfibrozil (LOPID) 600 MG tablet TAKE 1 TABLET TWICE DAILY BEFORE MEALS 03/09/17   Luking, Jonna CoupScott A, MD  ketoconazole (NIZORAL) 2 % cream Apply 1 application topically 2 (two) times daily. 04/01/17   Merlyn AlbertLuking, William S, MD  metoprolol tartrate (LOPRESSOR) 50 MG tablet TAKE 1 TABLET TWICE DAILY 03/09/17   Babs SciaraLuking, Scott A, MD  Family History Family History  Problem Relation Age of Onset  . Hypertension Mother   . Diabetes Mother   . Hypertension Father   . Heart attack Father   . Diabetes Sister     Social History Social History  Substance Use Topics  . Smoking status: Former Games developer  . Smokeless tobacco: Never Used     Comment: quit 40-50 years ago  . Alcohol use No     Comment: QUIT SMOKING AGE 7     Allergies   Hctz [hydrochlorothiazide] and Lipitor [atorvastatin]   Review of Systems Review of Systems  Constitutional: Negative for chills, fatigue and fever.  Eyes: Negative for visual disturbance.  Respiratory: Negative for chest tightness and shortness of breath.   Cardiovascular: Negative for chest pain.  Gastrointestinal: Negative  for abdominal pain, nausea and vomiting.  Genitourinary: Negative for difficulty urinating and dysuria.  Musculoskeletal: Positive for arthralgias (right shoulder pain) and neck pain. Negative for joint swelling, myalgias and neck stiffness.  Skin: Negative for color change and wound.       Tick bite right posterior waistline   Neurological: Negative for dizziness, syncope, facial asymmetry, speech difficulty, weakness and headaches.  Psychiatric/Behavioral: Negative for confusion.  All other systems reviewed and are negative.    Physical Exam Updated Vital Signs BP (!) 208/82 (BP Location: Right Arm)   Pulse 70   Temp 98.4 F (36.9 C) (Oral)   Resp 15   Ht 5\' 11"  (1.803 m)   Wt 102.5 kg (226 lb)   SpO2 98%   BMI 31.52 kg/m   Physical Exam  Constitutional: He is oriented to person, place, and time. He appears well-developed and well-nourished. No distress.  HENT:  Head: Atraumatic.  Mouth/Throat: Oropharynx is clear and moist.  Eyes: Pupils are equal, round, and reactive to light. Conjunctivae and EOM are normal.  Neck: No JVD present.  Cardiovascular: Normal rate, regular rhythm, normal heart sounds and intact distal pulses.   No murmur heard. Pulmonary/Chest: Effort normal and breath sounds normal. He exhibits no tenderness.  Abdominal: Soft. He exhibits no distension. There is no tenderness. There is no guarding.  Musculoskeletal: He exhibits tenderness.       Cervical back: He exhibits tenderness and spasm. He exhibits no bony tenderness and no swelling.       Back:  ttp of the right SCM and trapezius muscles.  Mildly tender along the muscles of the upper scapular border. 5/5 strength of the bilateral upper extremities  Neurological: He is alert and oriented to person, place, and time. No sensory deficit.  CN II-XII intact  Skin: Skin is warm. Capillary refill takes less than 2 seconds. No rash noted.  Healing tick bite to the right posterior waistline.  No surrounding  erythema or edema.   Nursing note and vitals reviewed.    ED Treatments / Results  Labs (all labs ordered are listed, but only abnormal results are displayed) Labs Reviewed - No data to display  EKG  EKG Interpretation None       Radiology Dg Cervical Spine Complete  Result Date: 04/23/2017 CLINICAL DATA:  77 year old male with right-sided neck pain. No injury. EXAM: CERVICAL SPINE - COMPLETE 4+ VIEW COMPARISON:  None. FINDINGS: There is no acute fracture or subluxation of the cervical spine. There is osteopenia with multilevel degenerative changes. The spinous processes and odontoid appear intact. There is anatomic alignment of the lateral masses of C1 and C2. The soft tissues are unremarkable. Bilateral carotid bulb atherosclerotic  plaques noted. Calcification of the laryngeal cartilage. IMPRESSION: No acute/ traumatic cervical spine pathology. Electronically Signed   By: Elgie CollardArash  Radparvar M.D.   On: 04/23/2017 20:44    Procedures Procedures (including critical care time)  Medications Ordered in ED Medications  ketorolac (TORADOL) injection 60 mg (not administered)  methocarbamol (ROBAXIN) tablet 500 mg (not administered)     Initial Impression / Assessment and Plan / ED Course  I have reviewed the triage vital signs and the nursing notes.  Pertinent labs & imaging results that were available during my care of the patient were reviewed by me and considered in my medical decision making (see chart for details).     2000  Pt also seen by Dr. Ethelda ChickJacubowitz.    Pt is hypertensive, hx of same.  Has not taken his evening dose of his anti-hypertensive medication.    On recheck, vitals signs improved.  Pain also improved.  Remains NV intact.  Appears stable for d/c.  Agrees to PCP f/u.  Return precautions discussed  Final Clinical Impressions(s) / ED Diagnoses   Final diagnoses:  Torticollis    New Prescriptions New Prescriptions   No medications on file     Rosey Bathriplett,  Tayten Bergdoll, PA-C 04/24/17 1618    Doug SouJacubowitz, Sam, MD 04/24/17 2001

## 2017-04-23 NOTE — ED Triage Notes (Signed)
Reports of right shoulder pain that radiates into right side of neck. Pain is worse with moving. Reports of having tick removed Friday.

## 2017-04-23 NOTE — Discharge Instructions (Signed)
Alternate ice and heat to your neck and shoulder.  Follow-up with your doctor for recheck if needed.  Return here for any worsening symptoms

## 2017-04-23 NOTE — ED Provider Notes (Signed)
Plains of right-sided shoulder pain along trapezius muscle worse with moving his shoulder improved with remaining still onset a week ago. No chest pain No fever no trauma no shortness of breath. He reports being bitten by take a week ago on his waist area. He denies any rash denies feeling ill. Patient is known hypertensive. Blood pressure noted to be elevated however he has not yet taken his evening dose of anti-hypertensive medication which is presently due   Doug SouJacubowitz, Nainoa Woldt, MD 04/23/17 1946

## 2017-04-23 NOTE — ED Notes (Signed)
Pt alert & oriented x4, stable gait. Patient  given discharge instructions, paperwork & prescription(s).Patient informed not to drive, operate any equipment & handel any important documents 4 hours after taking pain medication. Patient  verbalized understanding. Pt left department w/ no further questions. 

## 2017-04-23 NOTE — ED Notes (Signed)
Pt c/o right shoulder pain that goes up to his neck. Says pain started on the left and got better now on the right w/ no imporvement.

## 2017-04-28 DIAGNOSIS — M47812 Spondylosis without myelopathy or radiculopathy, cervical region: Secondary | ICD-10-CM | POA: Diagnosis not present

## 2017-04-28 DIAGNOSIS — M546 Pain in thoracic spine: Secondary | ICD-10-CM | POA: Diagnosis not present

## 2017-04-28 DIAGNOSIS — M9901 Segmental and somatic dysfunction of cervical region: Secondary | ICD-10-CM | POA: Diagnosis not present

## 2017-04-28 DIAGNOSIS — M436 Torticollis: Secondary | ICD-10-CM | POA: Diagnosis not present

## 2017-04-30 DIAGNOSIS — M546 Pain in thoracic spine: Secondary | ICD-10-CM | POA: Diagnosis not present

## 2017-04-30 DIAGNOSIS — M436 Torticollis: Secondary | ICD-10-CM | POA: Diagnosis not present

## 2017-04-30 DIAGNOSIS — M9901 Segmental and somatic dysfunction of cervical region: Secondary | ICD-10-CM | POA: Diagnosis not present

## 2017-04-30 DIAGNOSIS — M47812 Spondylosis without myelopathy or radiculopathy, cervical region: Secondary | ICD-10-CM | POA: Diagnosis not present

## 2017-04-30 MED FILL — Hydrocodone-Acetaminophen Tab 5-325 MG: ORAL | Qty: 6 | Status: AC

## 2017-05-01 DIAGNOSIS — M47812 Spondylosis without myelopathy or radiculopathy, cervical region: Secondary | ICD-10-CM | POA: Diagnosis not present

## 2017-05-01 DIAGNOSIS — M546 Pain in thoracic spine: Secondary | ICD-10-CM | POA: Diagnosis not present

## 2017-05-01 DIAGNOSIS — M436 Torticollis: Secondary | ICD-10-CM | POA: Diagnosis not present

## 2017-05-01 DIAGNOSIS — M9901 Segmental and somatic dysfunction of cervical region: Secondary | ICD-10-CM | POA: Diagnosis not present

## 2017-05-20 DIAGNOSIS — M9901 Segmental and somatic dysfunction of cervical region: Secondary | ICD-10-CM | POA: Diagnosis not present

## 2017-05-20 DIAGNOSIS — M546 Pain in thoracic spine: Secondary | ICD-10-CM | POA: Diagnosis not present

## 2017-05-20 DIAGNOSIS — M47812 Spondylosis without myelopathy or radiculopathy, cervical region: Secondary | ICD-10-CM | POA: Diagnosis not present

## 2017-05-20 DIAGNOSIS — M436 Torticollis: Secondary | ICD-10-CM | POA: Diagnosis not present

## 2017-05-27 DIAGNOSIS — M546 Pain in thoracic spine: Secondary | ICD-10-CM | POA: Diagnosis not present

## 2017-05-27 DIAGNOSIS — M9901 Segmental and somatic dysfunction of cervical region: Secondary | ICD-10-CM | POA: Diagnosis not present

## 2017-05-27 DIAGNOSIS — M47812 Spondylosis without myelopathy or radiculopathy, cervical region: Secondary | ICD-10-CM | POA: Diagnosis not present

## 2017-05-27 DIAGNOSIS — M436 Torticollis: Secondary | ICD-10-CM | POA: Diagnosis not present

## 2017-06-01 ENCOUNTER — Encounter: Payer: Self-pay | Admitting: Family Medicine

## 2017-06-01 ENCOUNTER — Ambulatory Visit (INDEPENDENT_AMBULATORY_CARE_PROVIDER_SITE_OTHER): Payer: Medicare HMO | Admitting: Family Medicine

## 2017-06-01 VITALS — BP 142/80 | Ht 70.0 in | Wt 235.5 lb

## 2017-06-01 DIAGNOSIS — I10 Essential (primary) hypertension: Secondary | ICD-10-CM | POA: Diagnosis not present

## 2017-06-01 DIAGNOSIS — Z0000 Encounter for general adult medical examination without abnormal findings: Secondary | ICD-10-CM

## 2017-06-01 DIAGNOSIS — E785 Hyperlipidemia, unspecified: Secondary | ICD-10-CM

## 2017-06-01 DIAGNOSIS — Z23 Encounter for immunization: Secondary | ICD-10-CM

## 2017-06-01 DIAGNOSIS — E1169 Type 2 diabetes mellitus with other specified complication: Secondary | ICD-10-CM

## 2017-06-01 MED ORDER — BENAZEPRIL HCL 40 MG PO TABS
40.0000 mg | ORAL_TABLET | Freq: Two times a day (BID) | ORAL | 1 refills | Status: DC
Start: 2017-06-01 — End: 2017-11-30

## 2017-06-01 MED ORDER — AMLODIPINE BESYLATE 10 MG PO TABS: 10.0000 mg | ORAL_TABLET | Freq: Every day | ORAL | 1 refills | Status: DC

## 2017-06-01 MED ORDER — METOPROLOL TARTRATE 50 MG PO TABS: 50.0000 mg | ORAL_TABLET | Freq: Two times a day (BID) | ORAL | 1 refills | Status: DC

## 2017-06-01 MED ORDER — GEMFIBROZIL 600 MG PO TABS: ORAL_TABLET | ORAL | 1 refills | Status: DC

## 2017-06-01 NOTE — Progress Notes (Signed)
Subjective:    Patient ID: Hector [name], male    DOB: 06-15-1941, 76 y.o.   MRN: [id number]  HPI AWV- Annual Wellness Visit  The patient was seen for their annual wellness visit. The patient's past medical history, surgical history, and family history were reviewed. Pertinent vaccines were reviewed ( tetanus, pneumonia, shingles, flu) The patient's medication list was reviewed and updated.  The height and weight were entered. The patient's current BMI is:*  Cognitive screening was completed. Outcome of Mini - Cog: *  Falls within the past 6 months:None  Current tobacco usage:  None  (All patients who use tobacco were given written and verbal information on quitting)  Recent listing of emergency department/hospitalizations over the past year were reviewed.  current specialist the patient sees on a regular basis: None    Medicare annual wellness visit patient questionnaire was reviewed.  A written screening schedule for the patient for the next 5-10 years was given. Appropriate discussion of followup regarding next visit was discussed.  Staying very active  Blood pressure medicine and blood pressure levels reviewed today with patient. Compliant with blood pressure medicine. States does not miss a dose. No obvious side effects. Blood pressure generally good when checked elsewhere. Watching salt intake.   Patient continues to take lipid medication regularly. No obvious side effects from it. Generally does not miss a dose. Prior blood work results are reviewed with patient. Patient continues to work on fat intake in diet  Results for orders placed or performed in visit on 11/25/16  Lipid panel  Result Value Ref Range   Cholesterol, Total 110 100 - 199 mg/dL   Triglycerides 409 (H) 0 - 149 mg/dL   HDL 30 (L) >81 mg/dL   VLDL Cholesterol Cal 30 5 - 40 mg/dL   LDL Calculated 50 0 - 99 mg/dL   Chol/HDL Ratio 3.7 0.0 - 5.0 ratio units  Hepatic function panel  Result Value Ref  Range   Total Protein 7.2 6.0 - 8.5 g/dL   Albumin 4.7 3.5 - 4.8 g/dL   Bilirubin Total 0.4 0.0 - 1.2 mg/dL   Bilirubin, Direct 1.91 0.00 - 0.40 mg/dL   Alkaline Phosphatase 61 39 - 117 IU/L   AST 30 0 - 40 IU/L   ALT 40 0 - 44 IU/L  Glucose, random  Result Value Ref Range   Glucose 108 (H) 65 - 99 mg/dL    Patient states no other concerns this visit.   Review of Systems  Constitutional: Negative for activity change, appetite change and fever.  HENT: Negative for congestion and rhinorrhea.   Eyes: Negative for discharge.  Respiratory: Negative for cough and wheezing.   Cardiovascular: Negative for chest pain.  Gastrointestinal: Negative for abdominal pain, blood in stool and vomiting.  Genitourinary: Negative for difficulty urinating and frequency.  Musculoskeletal: Negative for neck pain.  Skin: Negative for rash.  Allergic/Immunologic: Negative for environmental allergies and food allergies.  Neurological: Negative for weakness and headaches.  Psychiatric/Behavioral: Negative for agitation.  All other systems reviewed and are negative.      Objective:   Physical Exam  Constitutional: He appears well-developed and well-nourished.  HENT:  Head: Normocephalic and atraumatic.  Right Ear: External ear normal.  Left Ear: External ear normal.  Nose: Nose normal.  Mouth/Throat: Oropharynx is clear and moist.  Eyes: Pupils are equal, round, and reactive to light. EOM are normal.  Neck: Normal range of motion. Neck supple. No thyromegaly present.  Cardiovascular: Normal rate,  regular rhythm and normal heart sounds.   No murmur heard. Pulmonary/Chest: Effort normal and breath sounds normal. No respiratory distress. He has no wheezes.  Abdominal: Soft. Bowel sounds are normal. He exhibits no distension and no mass. There is no tenderness.  Genitourinary: Penis normal.  Genitourinary Comments: Prostate exam within normal limits  Musculoskeletal: Normal range of motion. He  exhibits no edema.  Lymphadenopathy:    He has no cervical adenopathy.  Neurological: He is alert. He exhibits normal muscle tone.  Skin: Skin is warm and dry. No erythema.  Psychiatric: He has a normal mood and affect. His behavior is normal. Judgment normal.          Assessment & Plan:  Impression 1 wellness exam. Up-to-date on colonoscopy. Diet discussed. Exercise discussed. Vaccines discussed and flu shot given  #2 hypertension good control discussed maintain same meds  #3 hyperlipidemia blood work ordered. Prior results reviewed medications maintain  Follow-up in 6 months

## 2017-06-02 LAB — BASIC METABOLIC PANEL
BUN/Creatinine Ratio: 12 (ref 10–24)
BUN: 12 mg/dL (ref 8–27)
CALCIUM: 9.6 mg/dL (ref 8.6–10.2)
CHLORIDE: 103 mmol/L (ref 96–106)
CO2: 22 mmol/L (ref 20–29)
Creatinine, Ser: 0.97 mg/dL (ref 0.76–1.27)
GFR calc Af Amer: 89 mL/min/{1.73_m2} (ref >59)
GFR, EST NON AFRICAN AMERICAN: 77 mL/min/{1.73_m2} (ref >59)
Glucose: 112 mg/dL — ABNORMAL HIGH (ref 65–99)
POTASSIUM: 4.7 mmol/L (ref 3.5–5.2)
Sodium: 140 mmol/L (ref 134–144)

## 2017-06-02 LAB — LIPID PANEL
CHOL/HDL RATIO: 3.6 ratio (ref 0.0–5.0)
CHOLESTEROL TOTAL: 113 mg/dL (ref 100–199)
HDL: 31 mg/dL — ABNORMAL LOW (ref >39)
LDL CALC: 53 mg/dL (ref 0–99)
Triglycerides: 147 mg/dL (ref 0–149)
VLDL CHOLESTEROL CAL: 29 mg/dL (ref 5–40)

## 2017-06-02 LAB — HEPATIC FUNCTION PANEL
ALBUMIN: 4.6 g/dL (ref 3.5–4.8)
ALT: 25 IU/L (ref 0–44)
AST: 24 IU/L (ref 0–40)
Alkaline Phosphatase: 62 IU/L (ref 39–117)
BILIRUBIN TOTAL: 0.4 mg/dL (ref 0.0–1.2)
Bilirubin, Direct: 0.1 mg/dL (ref 0.00–0.40)
Total Protein: 7.2 g/dL (ref 6.0–8.5)

## 2017-06-02 LAB — PSA: Prostate Specific Ag, Serum: 2.4 ng/mL (ref 0.0–4.0)

## 2017-06-03 DIAGNOSIS — M47812 Spondylosis without myelopathy or radiculopathy, cervical region: Secondary | ICD-10-CM | POA: Diagnosis not present

## 2017-06-03 DIAGNOSIS — M546 Pain in thoracic spine: Secondary | ICD-10-CM | POA: Diagnosis not present

## 2017-06-03 DIAGNOSIS — M9901 Segmental and somatic dysfunction of cervical region: Secondary | ICD-10-CM | POA: Diagnosis not present

## 2017-06-03 DIAGNOSIS — M436 Torticollis: Secondary | ICD-10-CM | POA: Diagnosis not present

## 2017-06-06 ENCOUNTER — Encounter: Payer: Self-pay | Admitting: Family Medicine

## 2017-06-10 DIAGNOSIS — M9901 Segmental and somatic dysfunction of cervical region: Secondary | ICD-10-CM | POA: Diagnosis not present

## 2017-06-10 DIAGNOSIS — M546 Pain in thoracic spine: Secondary | ICD-10-CM | POA: Diagnosis not present

## 2017-06-10 DIAGNOSIS — M436 Torticollis: Secondary | ICD-10-CM | POA: Diagnosis not present

## 2017-06-10 DIAGNOSIS — M47812 Spondylosis without myelopathy or radiculopathy, cervical region: Secondary | ICD-10-CM | POA: Diagnosis not present

## 2017-06-17 DIAGNOSIS — M9901 Segmental and somatic dysfunction of cervical region: Secondary | ICD-10-CM | POA: Diagnosis not present

## 2017-06-17 DIAGNOSIS — M436 Torticollis: Secondary | ICD-10-CM | POA: Diagnosis not present

## 2017-06-17 DIAGNOSIS — M546 Pain in thoracic spine: Secondary | ICD-10-CM | POA: Diagnosis not present

## 2017-06-17 DIAGNOSIS — M47812 Spondylosis without myelopathy or radiculopathy, cervical region: Secondary | ICD-10-CM | POA: Diagnosis not present

## 2017-07-01 DIAGNOSIS — M47812 Spondylosis without myelopathy or radiculopathy, cervical region: Secondary | ICD-10-CM | POA: Diagnosis not present

## 2017-07-01 DIAGNOSIS — M436 Torticollis: Secondary | ICD-10-CM | POA: Diagnosis not present

## 2017-07-01 DIAGNOSIS — M546 Pain in thoracic spine: Secondary | ICD-10-CM | POA: Diagnosis not present

## 2017-07-01 DIAGNOSIS — M9901 Segmental and somatic dysfunction of cervical region: Secondary | ICD-10-CM | POA: Diagnosis not present

## 2017-11-30 ENCOUNTER — Ambulatory Visit (INDEPENDENT_AMBULATORY_CARE_PROVIDER_SITE_OTHER): Payer: Medicare HMO | Admitting: Family Medicine

## 2017-11-30 ENCOUNTER — Encounter: Payer: Self-pay | Admitting: Family Medicine

## 2017-11-30 VITALS — BP 142/80 | Ht 70.0 in | Wt 237.0 lb

## 2017-11-30 DIAGNOSIS — Z79899 Other long term (current) drug therapy: Secondary | ICD-10-CM

## 2017-11-30 DIAGNOSIS — E1169 Type 2 diabetes mellitus with other specified complication: Secondary | ICD-10-CM

## 2017-11-30 DIAGNOSIS — I10 Essential (primary) hypertension: Secondary | ICD-10-CM

## 2017-11-30 DIAGNOSIS — E119 Type 2 diabetes mellitus without complications: Secondary | ICD-10-CM | POA: Diagnosis not present

## 2017-11-30 DIAGNOSIS — R7309 Other abnormal glucose: Secondary | ICD-10-CM | POA: Diagnosis not present

## 2017-11-30 DIAGNOSIS — Z13220 Encounter for screening for lipoid disorders: Secondary | ICD-10-CM

## 2017-11-30 DIAGNOSIS — E785 Hyperlipidemia, unspecified: Secondary | ICD-10-CM | POA: Diagnosis not present

## 2017-11-30 MED ORDER — AMLODIPINE BESYLATE 10 MG PO TABS: 10.0000 mg | ORAL_TABLET | Freq: Every day | ORAL | 1 refills | Status: DC

## 2017-11-30 MED ORDER — METOPROLOL TARTRATE 50 MG PO TABS: 50.0000 mg | ORAL_TABLET | Freq: Two times a day (BID) | ORAL | 1 refills | Status: DC

## 2017-11-30 MED ORDER — GEMFIBROZIL 600 MG PO TABS: ORAL_TABLET | ORAL | 1 refills | Status: DC

## 2017-11-30 MED ORDER — BENAZEPRIL HCL 40 MG PO TABS: 40.0000 mg | ORAL_TABLET | Freq: Two times a day (BID) | ORAL | 1 refills | Status: DC

## 2017-11-30 NOTE — Progress Notes (Signed)
   Subjective:    Patient ID: Hector [name], male    DOB: 03/30/1941, 77 y.o.   MRN: [id number] Patient arrives with numerous concerns Hypertension  This is a chronic problem. The current episode started more than 1 year ago. Treatments tried: amlodipine, benazepril, metoprolol. There are no compliance problems (eats healthy, takes meds every day, exercises).     Blood pressure medicine and blood pressure levels reviewed today with patient. Compliant with blood pressure medicine. States does not miss a dose. No obvious side effects. Blood pressure generally good when checked elsewhere. Watching salt intake.   Patient continues to take lipid medication regularly. No obvious side effects from it. Generally does not miss a dose. Prior blood work results are reviewed with patient. Patient continues to work on fat intake in diet  .watching glu [employer] for b fast  Getting a lot of movement on the job  Frustrated by progressive knee pain.  Old records reviewed.  History of bilateral osteoarthritis.  Injections in the past.  Feels a gel often cause him much pain after sitting for just a while.  Review of Systems No headache, no major weight loss or weight gain, no chest pain no back pain abdominal pain no change in bowel habits complete ROS otherwise negative     Objective:   Physical Exam  Alert and oriented, vitals reviewed and stable, NAD ENT-TM's and ext canals WNL bilat via otoscopic exam Soft palate, tonsils and post pharynx WNL via oropharyngeal exam Neck-symmetric, no masses; thyroid nonpalpable and nontender Pulmonary-no tachypnea or accessory muscle use; Clear without wheezes via auscultation Card--no abnrml murmurs, rhythm reg and rate WNL Carotid pulses symmetric, without bruits   Impression prediabetes.  Diet discussed.  Sugar intake discussed.      Assessment & Plan:  Need to reassess.  2.  Hypertension controlled good discussed medications discussed  to maintain  3.  Hyperlipidemia status uncertain.  Prior blood work reviewed to maintain her graph #4 progressive knee arthritis discussion held encouraged to get back with Dr. Romeo AppleHarrison for next step  Follow-up in 6 months for wellness plus chronic

## 2017-12-01 LAB — LIPID PANEL
CHOLESTEROL TOTAL: 145 mg/dL (ref 100–199)
Chol/HDL Ratio: 4.3 ratio (ref 0.0–5.0)
HDL: 34 mg/dL — AB (ref >39)
LDL Calculated: 60 mg/dL (ref 0–99)
Triglycerides: 253 mg/dL — ABNORMAL HIGH (ref 0–149)
VLDL CHOLESTEROL CAL: 51 mg/dL — AB (ref 5–40)

## 2017-12-01 LAB — HEPATIC FUNCTION PANEL
ALBUMIN: 4.9 g/dL — AB (ref 3.5–4.8)
ALK PHOS: 49 IU/L (ref 39–117)
ALT: 34 IU/L (ref 0–44)
AST: 28 IU/L (ref 0–40)
BILIRUBIN, DIRECT: 0.15 mg/dL (ref 0.00–0.40)
Bilirubin Total: 0.5 mg/dL (ref 0.0–1.2)
Total Protein: 7.6 g/dL (ref 6.0–8.5)

## 2017-12-01 LAB — GLUCOSE, RANDOM: Glucose: 125 mg/dL — ABNORMAL HIGH (ref 65–99)

## 2017-12-07 ENCOUNTER — Encounter: Payer: Self-pay | Admitting: Family Medicine

## 2018-03-31 ENCOUNTER — Ambulatory Visit (INDEPENDENT_AMBULATORY_CARE_PROVIDER_SITE_OTHER): Payer: Medicare HMO | Admitting: Family Medicine

## 2018-03-31 ENCOUNTER — Encounter: Payer: Self-pay | Admitting: Family Medicine

## 2018-03-31 ENCOUNTER — Other Ambulatory Visit: Payer: Self-pay | Admitting: *Deleted

## 2018-03-31 VITALS — BP 162/90 | Temp 98.0°F | Ht 70.0 in | Wt 238.6 lb

## 2018-03-31 DIAGNOSIS — I10 Essential (primary) hypertension: Secondary | ICD-10-CM | POA: Diagnosis not present

## 2018-03-31 DIAGNOSIS — J329 Chronic sinusitis, unspecified: Secondary | ICD-10-CM | POA: Diagnosis not present

## 2018-03-31 MED ORDER — HYDROCHLOROTHIAZIDE 25 MG PO TABS: ORAL_TABLET | ORAL | 3 refills | Status: DC

## 2018-03-31 MED ORDER — DOXYCYCLINE HYCLATE 100 MG PO TABS: 100.0000 mg | ORAL_TABLET | Freq: Two times a day (BID) | ORAL | 0 refills | Status: DC

## 2018-03-31 NOTE — Progress Notes (Signed)
   Subjective:    Patient ID: Hector [name], male    DOB: October 22, 1940, 77 y.o.   MRN: [id number] Patient arrives with numerous concerns notes blood pressure has been elevated with HPI Pt here to get checked for Lymes disease. Has had about 3 tick bites this year.    3 weeks ago woke up sweats, chills, loss of hearing, congestion, fatigue near the end of day, dull headache  Ha h a dull in nature, in the temples, at ties acts up and closing eyes helps     and is still having these symptoms.   Tiny ticks, three bites this spring   Three wks ago developed a head cold felt miserable, lost some hearing, lost taste for food,  Dim energy and tired, an dull heaache and not wanting to go away   Some chills , at times, no measureable fever   Pt states he is feeling clammy, which is unusual for him.  Blood pressure medicine and blood pressure levels reviewed today with patient. Compliant with blood pressure medicine. States does not miss a dose. No obvious side effects. Blood pressure generally good when checked elsewhere. Watching salt intake. Blood pressure elevated recently Notes Review of Systems No headache, no major weight loss or weight gain, no chest pain no back pain abdominal pain no change in bowel habits complete ROS otherwise negative     Objective:   Physical Exam Alert and oriented, vitals reviewed and stable, NAD ENT-TM's and ext canals WNL bilat via otoscopic exam Soft palate, tonsils and post pharynx WNL via oropharyngeal exam Neck-symmetric, no masses; thyroid nonpalpable and nontender Pulmonary-no tachypnea or accessory muscle use; Clear without wheezes via auscultation Card--no abnrml murmurs, rhythm reg and rate WNL Carotid pulses symmetric, without bruits        Assessment & Plan:  1 hypertension suboptimal control discussed we will add hydrochlorothiazide rationale discussed  2.  Subacute rhinosinusitis or secondary symptomatology  3.  Multiple tick  bites with worry regarding tick illness.  I really doubt this.  Rationale discussed.  Will cover with Doxy for #2 rationale also discussed.  Low chance of active disease discussed also   Greater than 50% of this 25 minute face to face visit was spent in counseling and discussion and coordination of care regarding the above diagnosis/diagnosies Limitation blood work discussed.

## 2018-06-03 ENCOUNTER — Encounter: Payer: Self-pay | Admitting: Family Medicine

## 2018-06-03 ENCOUNTER — Ambulatory Visit (INDEPENDENT_AMBULATORY_CARE_PROVIDER_SITE_OTHER): Payer: Medicare HMO | Admitting: Family Medicine

## 2018-06-03 VITALS — BP 138/78 | Ht 70.0 in | Wt 236.0 lb

## 2018-06-03 DIAGNOSIS — R7309 Other abnormal glucose: Secondary | ICD-10-CM | POA: Diagnosis not present

## 2018-06-03 DIAGNOSIS — Z79899 Other long term (current) drug therapy: Secondary | ICD-10-CM | POA: Diagnosis not present

## 2018-06-03 DIAGNOSIS — Z125 Encounter for screening for malignant neoplasm of prostate: Secondary | ICD-10-CM

## 2018-06-03 DIAGNOSIS — Z23 Encounter for immunization: Secondary | ICD-10-CM

## 2018-06-03 DIAGNOSIS — Z0000 Encounter for general adult medical examination without abnormal findings: Secondary | ICD-10-CM

## 2018-06-03 DIAGNOSIS — Z13220 Encounter for screening for lipoid disorders: Secondary | ICD-10-CM

## 2018-06-03 DIAGNOSIS — I10 Essential (primary) hypertension: Secondary | ICD-10-CM | POA: Diagnosis not present

## 2018-06-03 DIAGNOSIS — N528 Other male erectile dysfunction: Secondary | ICD-10-CM | POA: Diagnosis not present

## 2018-06-03 MED ORDER — AMLODIPINE BESYLATE 10 MG PO TABS: 10.0000 mg | ORAL_TABLET | Freq: Every day | ORAL | 1 refills | Status: DC

## 2018-06-03 MED ORDER — BENAZEPRIL HCL 40 MG PO TABS: 40.0000 mg | ORAL_TABLET | Freq: Two times a day (BID) | ORAL | 1 refills | Status: DC

## 2018-06-03 MED ORDER — METOPROLOL TARTRATE 50 MG PO TABS: 50.0000 mg | ORAL_TABLET | Freq: Two times a day (BID) | ORAL | 1 refills | Status: DC

## 2018-06-03 MED ORDER — GEMFIBROZIL 600 MG PO TABS: ORAL_TABLET | ORAL | 1 refills | Status: DC

## 2018-06-03 NOTE — Progress Notes (Signed)
   Subjective:    Patient ID: Hector [name], male    DOB: [birth year], 77 y.o.   MRN: [id number]  HPI  The patient comes in today for a wellness visit.    A review of their health history was completed.  A review of medications was also completed.  Any needed refills; yes  Eating habits: trying to eat better  Falls/  MVA accidents in past few months: none  Mini cog: pass  Regular exercise: stays active-still working  Specialist pt sees on regular basis: none  Preventative health issues were discussed.   Additional concerns: none  Blood pressure medicine and blood pressure levels reviewed today with patient. Compliant with blood pressure medicine. States does not miss a dose. No obvious side effects. Blood pressure generally good when checked elsewhere. Watching salt intake.  Patient continues to take lipid medication regularly. No obvious side effects from it. Generally does not miss a dose. Prior blood work results are reviewed with patient. Patient continues to work on fat intake in diet   Review of Systems  Constitutional: Negative for activity change, appetite change and fever.  HENT: Negative for congestion and rhinorrhea.   Eyes: Negative for discharge.  Respiratory: Negative for cough and wheezing.   Cardiovascular: Negative for chest pain.  Gastrointestinal: Negative for abdominal pain, blood in stool and vomiting.  Genitourinary: Negative for difficulty urinating and frequency.  Musculoskeletal: Negative for neck pain.  Skin: Negative for rash.  Allergic/Immunologic: Negative for environmental allergies and food allergies.  Neurological: Negative for weakness and headaches.  Psychiatric/Behavioral: Negative for agitation.  All other systems reviewed and are negative.      Objective:   Physical Exam  Constitutional: He appears well-developed and well-nourished.  HENT:  Head: Normocephalic and atraumatic.  Right Ear: External ear normal.  Left Ear:  External ear normal.  Nose: Nose normal.  Mouth/Throat: Oropharynx is clear and moist.  Eyes: Pupils are equal, round, and reactive to light. EOM are normal.  Neck: Normal range of motion. Neck supple. No thyromegaly present.  Cardiovascular: Normal rate, regular rhythm and normal heart sounds.  No murmur heard. Pulmonary/Chest: Effort normal and breath sounds normal. No respiratory distress. He has no wheezes.  Abdominal: Soft. Bowel sounds are normal. He exhibits no distension and no mass. There is no tenderness.  Genitourinary: Penis normal.  Musculoskeletal: Normal range of motion. He exhibits no edema.  Lymphadenopathy:    He has no cervical adenopathy.  Neurological: He is alert. He exhibits normal muscle tone.  Skin: Skin is warm and dry. No erythema.  Psychiatric: He has a normal mood and affect. His behavior is normal. Judgment normal.  Vitals reviewed.         Assessment & Plan:  1 Wellness.  Diet discussed exercise discussed.  Blood work results discussed.  Vaccines discussed and administered.  2.  hyperetention.  Patient has adjusted some of medications on his own, held off on the diuretic because of concern about impact on erectile dysfunction.  Good control discussed maintain meds  3.  Hyperlipidemia.  Also good control discussed to maintain same  Follow-up in 6 months.  Exercise discussed medications refilled Clon neg in 2016 next due ten yrs.  Flu shot today.

## 2018-08-02 DIAGNOSIS — Z13220 Encounter for screening for lipoid disorders: Secondary | ICD-10-CM | POA: Diagnosis not present

## 2018-08-02 DIAGNOSIS — Z125 Encounter for screening for malignant neoplasm of prostate: Secondary | ICD-10-CM | POA: Diagnosis not present

## 2018-08-02 DIAGNOSIS — Z79899 Other long term (current) drug therapy: Secondary | ICD-10-CM | POA: Diagnosis not present

## 2018-08-03 LAB — BASIC METABOLIC PANEL
BUN/Creatinine Ratio: 14 (ref 10–24)
BUN: 15 mg/dL (ref 8–27)
CALCIUM: 10.2 mg/dL (ref 8.6–10.2)
CO2: 21 mmol/L (ref 20–29)
Chloride: 104 mmol/L (ref 96–106)
Creatinine, Ser: 1.1 mg/dL (ref 0.76–1.27)
GFR calc Af Amer: 76 mL/min/{1.73_m2} (ref >59)
GFR, EST NON AFRICAN AMERICAN: 65 mL/min/{1.73_m2} (ref >59)
Glucose: 123 mg/dL — ABNORMAL HIGH (ref 65–99)
POTASSIUM: 5 mmol/L (ref 3.5–5.2)
Sodium: 140 mmol/L (ref 134–144)

## 2018-08-03 LAB — LIPID PANEL
CHOL/HDL RATIO: 4.2 ratio (ref 0.0–5.0)
Cholesterol, Total: 127 mg/dL (ref 100–199)
HDL: 30 mg/dL — ABNORMAL LOW (ref >39)
LDL CALC: 68 mg/dL (ref 0–99)
TRIGLYCERIDES: 145 mg/dL (ref 0–149)
VLDL Cholesterol Cal: 29 mg/dL (ref 5–40)

## 2018-08-03 LAB — HEPATIC FUNCTION PANEL
ALT: 34 IU/L (ref 0–44)
AST: 30 IU/L (ref 0–40)
Albumin: 4.7 g/dL (ref 3.5–4.8)
Alkaline Phosphatase: 57 IU/L (ref 39–117)
BILIRUBIN TOTAL: 0.4 mg/dL (ref 0.0–1.2)
Bilirubin, Direct: 0.1 mg/dL (ref 0.00–0.40)
TOTAL PROTEIN: 7.2 g/dL (ref 6.0–8.5)

## 2018-08-03 LAB — PSA: Prostate Specific Ag, Serum: 2.7 ng/mL (ref 0.0–4.0)

## 2018-08-09 ENCOUNTER — Encounter: Payer: Self-pay | Admitting: Family Medicine

## 2018-10-04 ENCOUNTER — Ambulatory Visit (INDEPENDENT_AMBULATORY_CARE_PROVIDER_SITE_OTHER): Payer: Medicare HMO | Admitting: Family Medicine

## 2018-10-04 ENCOUNTER — Encounter: Payer: Self-pay | Admitting: Family Medicine

## 2018-10-04 VITALS — BP 130/88 | Temp 97.9°F | Wt 239.2 lb

## 2018-10-04 DIAGNOSIS — M1711 Unilateral primary osteoarthritis, right knee: Secondary | ICD-10-CM | POA: Diagnosis not present
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
## 2018-10-06 ENCOUNTER — Encounter: Payer: Self-pay | Admitting: Family Medicine

## 2018-10-18 ENCOUNTER — Telehealth: Payer: Self-pay | Admitting: Orthopedic Surgery

## 2018-10-18 NOTE — Telephone Encounter (Signed)
Patient called to ask about how to request records and films [facility]  Due to needing immediately, to come to office to sign release, and we will print internally through "release of information" process upon receipt of release authorization, which patient will sign for "self."  Trying to come in today, 10/18/2018.* Patient did not yet come in to sign authorization

## 2018-10-21 ENCOUNTER — Telehealth: Payer: Self-pay | Admitting: Orthopedic Surgery

## 2018-10-21 NOTE — Telephone Encounter (Signed)
Patient signed release; request processed accordingly.

## 2018-10-21 NOTE — Telephone Encounter (Signed)
Patient aware records are ready for pick up

## 2018-11-04 DIAGNOSIS — Z0100 Encounter for examination of eyes and vision without abnormal findings: Secondary | ICD-10-CM | POA: Diagnosis not present

## 2018-11-04 DIAGNOSIS — H2510 Age-related nuclear cataract, unspecified eye: Secondary | ICD-10-CM | POA: Diagnosis not present

## 2018-11-04 DIAGNOSIS — I10 Essential (primary) hypertension: Secondary | ICD-10-CM | POA: Diagnosis not present

## 2018-11-04 DIAGNOSIS — H524 Presbyopia: Secondary | ICD-10-CM | POA: Diagnosis not present

## 2018-11-04 DIAGNOSIS — E7800 Pure hypercholesterolemia, unspecified: Secondary | ICD-10-CM | POA: Diagnosis not present

## 2018-11-12 DIAGNOSIS — M1711 Unilateral primary osteoarthritis, right knee: Secondary | ICD-10-CM | POA: Diagnosis not present

## 2018-11-12 DIAGNOSIS — M25561 Pain in right knee: Secondary | ICD-10-CM | POA: Diagnosis not present

## 2018-11-29 ENCOUNTER — Telehealth: Payer: Self-pay | Admitting: *Deleted

## 2018-11-29 NOTE — Telephone Encounter (Signed)
Pt's wife dropped off surgical clearance form to be filled out. Pt has a 6 month follow up on 12/03/18. Form in dr steve's folder. Surgery right total knee replacement on April 7th.

## 2018-12-02 ENCOUNTER — Ambulatory Visit: Payer: Medicare HMO | Admitting: Family Medicine

## 2018-12-03 ENCOUNTER — Other Ambulatory Visit: Payer: Self-pay

## 2018-12-03 ENCOUNTER — Ambulatory Visit (INDEPENDENT_AMBULATORY_CARE_PROVIDER_SITE_OTHER): Payer: Medicare HMO | Admitting: Family Medicine

## 2018-12-03 ENCOUNTER — Encounter: Payer: Self-pay | Admitting: Family Medicine

## 2018-12-03 VITALS — BP 154/82 | Temp 98.2°F | Ht 70.0 in | Wt 242.0 lb

## 2018-12-03 DIAGNOSIS — R739 Hyperglycemia, unspecified: Secondary | ICD-10-CM

## 2018-12-03 DIAGNOSIS — E785 Hyperlipidemia, unspecified: Secondary | ICD-10-CM

## 2018-12-03 DIAGNOSIS — I10 Essential (primary) hypertension: Secondary | ICD-10-CM

## 2018-12-03 DIAGNOSIS — B349 Viral infection, unspecified: Secondary | ICD-10-CM

## 2018-12-03 LAB — POCT GLYCOSYLATED HEMOGLOBIN (HGB A1C): Hemoglobin A1C: 6.1 % — AB (ref 4.0–5.6)

## 2018-12-03 NOTE — Progress Notes (Signed)
Subjective:    Patient ID: Hector [name], male    DOB: [birth year], 78 y.o.   MRN: [id number] Patient arrives office for follow-up of chronic health concerns Hypertension  This is a chronic problem. Treatments tried: norvasc, benazepril, metoprolol.   Knee replacement on April 7th. Dropped off surgical clearance form earlier this week.   Scratchy throat, cough, sneezing and drainage. No fever.  Started one week ago and feeling better now.  Overall much better  Blood pressure medicine and blood pressure levels reviewed today with patient. Compliant with blood pressure medicine. States does not miss a dose. No obvious side effects. Blood pressure generally good when checked elsewhere. Watching salt intake.  Patient continues to take lipid medication regularly. No obvious side effects from it. Generally does not miss a dose. Prior blood work results are reviewed with patient. Patient continues to work on fat intake in diet  Results for orders placed or performed in visit on 12/03/18  POCT glycosylated hemoglobin (Hb A1C)  Result Value Ref Range   Hemoglobin A1C 6.1 (A) 4.0 - 5.6 %   HbA1c POC (<> result, manual entry)     HbA1c, POC (prediabetic range)     HbA1c, POC (controlled diabetic range)         Had elevated glucose on last blood work.                                                                                                                                                                                                                                                 Review of Systems No headache, no major weight loss or weight gain, no chest pain no back pain abdominal pain no change in bowel habits complete ROS otherwise negative      Objective:   Physical Exam Alert and oriented, vitals reviewed and stable, NAD ENT-TM's and ext canals WNL bilat via otoscopic exam Soft palate, tonsils and post pharynx WNL via oropharyngeal exam Neck-symmetric, no masses; thyroid nonpalpable and nontender Pulmonary-no tachypnea or accessory muscle use; Clear without wheezes via auscultation Card--no abnrml murmurs, rhythm reg and rate WNL Carotid pulses symmetric, without bruits        Assessment & Plan:  Impression 1 hypertension good control discussed maintain same meds  2.  Hyperlipidemia last blood work good discussed to maintain  3.  Prediabetes.  Discussed.  A1c still prediabetic thankfully discussed  4.  Recent viral syndrome clinically improved  Meds refilled diet exercise discussed follow-up in 6 months for wellness plus chronic due to have knee surgery soon we will fill out form  Greater than 50% of this 40 minute face to face visit was spent in counseling and discussion and coordination of care regarding the above diagnosis/diagnosies   Of note, this particular visit was not entered as a charge over 1 year ago, therefore no recoverable charge will be available,

## 2018-12-28 ENCOUNTER — Encounter (HOSPITAL_COMMUNITY): Payer: Self-pay

## 2018-12-28 ENCOUNTER — Ambulatory Visit (HOSPITAL_COMMUNITY): Admit: 2018-12-28 | Payer: Medicare HMO | Admitting: Orthopedic Surgery

## 2018-12-28 SURGERY — ARTHROPLASTY, KNEE, TOTAL
Anesthesia: Spinal | Laterality: Right

## 2019-01-12 ENCOUNTER — Other Ambulatory Visit: Payer: Self-pay | Admitting: Family Medicine

## 2019-02-08 NOTE — Progress Notes (Signed)
SPOKE W/  _Patient     SCREENING SYMPTOMS OF COVID 19:   COUGH--no  RUNNY NOSE--- no  SORE THROAT---no  NASAL CONGESTION----no  SNEEZING----no  SHORTNESS OF BREATH---no  DIFFICULTY BREATHING---no  TEMP >100.0 -----no  UNEXPLAINED BODY ACHES------no  CHILLS -------- no  HEADACHES ---------no  LOSS OF SMELL/ TASTE --------no    HAVE YOU OR ANY FAMILY MEMBER TRAVELLED PAST 14 DAYS OUT OF THE   COUNTY---no STATE----no COUNTRY----no  HAVE YOU OR ANY FAMILY MEMBER BEEN EXPOSED TO ANYONE WITH COVID 19? no    

## 2019-02-09 ENCOUNTER — Encounter (HOSPITAL_COMMUNITY)
Admission: RE | Admit: 2019-02-09 | Discharge: 2019-02-09 | Disposition: A | Discharge status: Home / Self Care | Payer: Medicare HMO | Source: Ambulatory Visit | Attending: Orthopedic Surgery | Admitting: Orthopedic Surgery

## 2019-02-09 ENCOUNTER — Encounter (HOSPITAL_COMMUNITY): Payer: Self-pay

## 2019-02-09 ENCOUNTER — Other Ambulatory Visit: Payer: Self-pay

## 2019-02-09 DIAGNOSIS — M1711 Unilateral primary osteoarthritis, right knee: Secondary | ICD-10-CM | POA: Insufficient documentation

## 2019-02-09 DIAGNOSIS — Z1159 Encounter for screening for other viral diseases: Secondary | ICD-10-CM | POA: Insufficient documentation

## 2019-02-09 DIAGNOSIS — I4510 Unspecified right bundle-branch block: Secondary | ICD-10-CM | POA: Diagnosis not present

## 2019-02-09 DIAGNOSIS — R9431 Abnormal electrocardiogram [ECG] [EKG]: Secondary | ICD-10-CM | POA: Diagnosis not present

## 2019-02-09 DIAGNOSIS — Z01818 Encounter for other preprocedural examination: Secondary | ICD-10-CM | POA: Diagnosis not present

## 2019-02-09 DIAGNOSIS — R001 Bradycardia, unspecified: Secondary | ICD-10-CM | POA: Diagnosis not present

## 2019-02-09 LAB — BASIC METABOLIC PANEL
Anion gap: 8 (ref 5–15)
BUN: 13 mg/dL (ref 8–23)
CO2: 27 mmol/L (ref 22–32)
Calcium: 9.5 mg/dL (ref 8.9–10.3)
Chloride: 104 mmol/L (ref 98–111)
Creatinine, Ser: 0.97 mg/dL (ref 0.61–1.24)
GFR calc Af Amer: >60 mL/min (ref >60)
GFR calc non Af Amer: >60 mL/min (ref >60)
Glucose, Bld: 114 mg/dL — ABNORMAL HIGH (ref 70–99)
Potassium: 4.2 mmol/L (ref 3.5–5.1)
Sodium: 139 mmol/L (ref 135–145)

## 2019-02-09 LAB — CBC
HCT: 44 % (ref 39.0–52.0)
Hemoglobin: 14.6 g/dL (ref 13.0–17.0)
MCH: 31.4 pg (ref 26.0–34.0)
MCHC: 33.2 g/dL (ref 30.0–36.0)
MCV: 94.6 fL (ref 80.0–100.0)
Platelets: 284 10*3/uL (ref 150–400)
RBC: 4.65 MIL/uL (ref 4.22–5.81)
RDW: 14.1 % (ref 11.5–15.5)
WBC: 9.2 10*3/uL (ref 4.0–10.5)
nRBC: 0.4 % — ABNORMAL HIGH (ref 0.0–0.2)

## 2019-02-09 LAB — SURGICAL PCR SCREEN
MRSA, PCR: NEGATIVE
Staphylococcus aureus: NEGATIVE

## 2019-02-09 NOTE — Patient Instructions (Signed)
[name]   Your procedure is scheduled on: 5/26   Report to [facility] Main  Entrance  Report to admitting a t7:15 AM   YOU NEED TO HAVE A COVID 19 TEST ON today_______, THIS TEST MUST BE DONE BEFORE SURGERY, COME TO [facility] ENTRANCE BETWEEN THE HOURS OF 900 AM AND 300 PM ON YOUR COVID TEST DATE.   Call this number if you have problems the morning of surgery [phone]    Remember: Do not eat food   :After Midnight.  BRUSH YOUR TEETH MORNING OF SURGERY AND RINSE YOUR MOUTH OUT, NO CHEWING GUM CANDY OR MINTS.   Do not eat food After Midnight. YOU MAY HAVE CLEAR LIQUIDS FROM MIDNIGHT UNTIL 4:30AM . At 4:30AM Please finish the prescribed Pre-Surgery Gatorade drink. Nothing by mouth after you finish the Gatorade drink ! [place] - Preparing for Surgery Before surgery, you can play an important role.  Because skin is not sterile, your skin needs to be as free of germs as possible.  You can reduce the number of germs on your skin by washing with CHG (chlorahexidine gluconate) soap before surgery.  CHG is an antiseptic cleaner which kills germs and bonds with the skin to continue killing germs even after washing. Please DO NOT use if you have an allergy to CHG or antibacterial soaps.  If your skin becomes reddened/irritated stop using the CHG and inform your nurse when you arrive at Short Stay. Do not shave (including legs and underarms) for at least 48 hours prior to the first CHG shower.  You may shave your face/neck. Please follow these instructions carefully:  1.  Shower with CHG Soap the night before surgery and the  morning of Surgery.  2.  If you choose to wash your hair, wash your hair first as usual with your  normal  shampoo.  3.  After you shampoo, rinse your hair and body thoroughly to remove the  shampoo.                                        4.  Use CHG as you would any other liquid soap.  You can apply chg  directly  to the skin and wash                       Gently with a scrungie or clean washcloth.  5.  Apply the CHG Soap to your body ONLY FROM THE NECK DOWN.   Do not use on face/ open                           Wound or open sores. Avoid contact with eyes, ears mouth and genitals (private parts).                       Wash face,  Genitals (private parts) with your normal soap.             6.  Wash thoroughly, paying special attention to the area where your surgery  will be performed.  7.  Thoroughly rinse your body with warm water from the neck down.  8.  DO NOT shower/wash with your normal soap after using and rinsing off  the CHG Soap.                9.  Pat yourself dry with a clean towel.            10.  Wear clean pajamas.            11.  Place clean sheets on your bed the night of your first shower and do not  sleep with pets. Day of Surgery : Do not apply any lotions/deodorants the morning of surgery.  Please wear clean clothes to the hospital/surgery center.  FAILURE TO FOLLOW THESE INSTRUCTIONS MAY RESULT IN THE CANCELLATION OF YOUR SURGERY PATIENT SIGNATURE_________________________________  NURSE SIGNATURE__________________________________  ________________________________________________________________________  Rogelia Mire  An incentive spirometer is a tool that can help keep your lungs clear and active. This tool measures how well you are filling your lungs with each breath. Taking long deep breaths may help reverse or decrease the chance of developing breathing (pulmonary) problems (especially infection) following:  A long period of time when you are unable to move or be active. BEFORE THE PROCEDURE   If the spirometer includes an indicator to show your best effort, your nurse or respiratory therapist will set it to a desired goal.  If possible, sit up straight or lean slightly forward. Try not to slouch.  Hold the incentive spirometer in an upright  position. INSTRUCTIONS FOR USE  1. Sit on the edge of your bed if possible, or sit up as far as you can in bed or on a chair. 2. Hold the incentive spirometer in an upright position. 3. Breathe out normally. 4. Place the mouthpiece in your mouth and seal your lips tightly around it. 5. Breathe in slowly and as deeply as possible, raising the piston or the ball toward the top of the column. 6. Hold your breath for 3-5 seconds or for as long as possible. Allow the piston or ball to fall to the bottom of the column. 7. Remove the mouthpiece from your mouth and breathe out normally. 8. Rest for a few seconds and repeat Steps 1 through 7 at least 10 times every 1-2 hours when you are awake. Take your time and take a few normal breaths between deep breaths. 9. The spirometer may include an indicator to show your best effort. Use the indicator as a goal to work toward during each repetition. 10. After each set of 10 deep breaths, practice coughing to be sure your lungs are clear. If you have an incision (the cut made at the time of surgery), support your incision when coughing by placing a pillow or rolled up towels firmly against it. Once you are able to get out of bed, walk around indoors and cough well. You may stop using the incentive spirometer when instructed by your caregiver.  RISKS AND COMPLICATIONS  Take your time so you do not get dizzy or light-headed.  If you are in pain, you may need to take or ask for pain medication before doing incentive spirometry. It is harder to take a deep breath if you are having pain. AFTER USE  Rest and breathe slowly and easily.  It can be helpful to keep track of a log of your progress. Your caregiver can provide you with a simple table to help with this. If you are using the spirometer at home, follow these instructions: SEEK MEDICAL CARE IF:   You are having difficultly using the spirometer.  You have trouble using the spirometer as often as  instructed.  Your pain medication is not giving enough relief while using the spirometer.  You develop fever of 100.5 F (38.1 C) or higher. SEEK IMMEDIATE MEDICAL CARE IF:   You cough up bloody sputum that had not been present before.  You develop fever of 102 F (38.9 C) or greater.  You develop worsening pain at or near the incision site. MAKE SURE YOU:   Understand these instructions.  Will watch your condition.  Will get help right away if you are not doing well or get worse. Document Released: 01/19/2007 Document Revised: 12/01/2011 Document Reviewed: 03/22/2007 ExitCare Patient Information 2014 ExitCare, [place].   ________________________________________________________________________  WHAT IS A BLOOD TRANSFUSION? Blood Transfusion Information  A transfusion is the replacement of blood or some of its parts. Blood is made up of multiple cells which provide different functions.  Red blood cells carry oxygen and are used for blood loss replacement.  White blood cells fight against infection.  Platelets control bleeding.  Plasma helps clot blood.  Other blood products are available for specialized needs, such as hemophilia or other clotting disorders. BEFORE THE TRANSFUSION  Who gives blood for transfusions?   Healthy volunteers who are fully evaluated to make sure their blood is safe. This is blood bank blood. Transfusion therapy is the safest it has ever been in the practice of medicine. Before blood is taken from a donor, a complete history is taken to make sure that person has no history of diseases nor engages in risky social behavior (examples are intravenous drug use or sexual activity with multiple partners). The donor's travel history is screened to minimize risk of transmitting infections, such as malaria. The donated blood is tested for signs of infectious diseases, such as HIV and hepatitis. The blood is then tested to be sure it is compatible with you in  order to minimize the chance of a transfusion reaction. If you or a relative donates blood, this is often done in anticipation of surgery and is not appropriate for emergency situations. It takes many days to process the donated blood. RISKS AND COMPLICATIONS Although transfusion therapy is very safe and saves many lives, the main dangers of transfusion include:   Getting an infectious disease.  Developing a transfusion reaction. This is an allergic reaction to something in the blood you were given. Every precaution is taken to prevent this. The decision to have a blood transfusion has been considered carefully by your caregiver before blood is given. Blood is not given unless the benefits outweigh the risks. AFTER THE TRANSFUSION  Right after receiving a blood transfusion, you will usually feel much better and more energetic. This is especially true if your red blood cells have gotten low (anemic). The transfusion raises the level of the red blood cells which carry oxygen, and this usually causes an energy increase.  The nurse administering the transfusion will monitor you carefully for complications. HOME CARE INSTRUCTIONS  No special instructions are needed after a transfusion. You may find your energy is better. Speak with your caregiver about any limitations on activity for underlying diseases you may have. SEEK MEDICAL CARE IF:   Your condition is not improving after your transfusion.  You develop redness or irritation at the intravenous (IV) site. SEEK IMMEDIATE MEDICAL CARE IF:  Any of the following symptoms occur over the next 12 hours:  Shaking chills.  You have a temperature by mouth above 102 F (38.9 C), not controlled by medicine.  Chest, back, or muscle pain.  People around you feel you are not acting correctly or are confused.  Shortness of breath or difficulty breathing.  Dizziness and fainting.  You get a rash or develop hives.  You have a decrease in urine  output.  Your urine turns a dark color or changes to pink, red, or brown. Any of the following symptoms occur over the next 10 days:  You have a temperature by mouth above 102 F (38.9 C), not controlled by medicine.  Shortness of breath.  Weakness after normal activity.  The white part of the eye turns yellow (jaundice).  You have a decrease in the amount of urine or are urinating less often.  Your urine turns a dark color or changes to pink, red, or brown. Document Released: 09/05/2000 Document Revised: 12/01/2011 Document Reviewed: 04/24/2008 ExitCare Patient Information 2014 ExitCare, [place].  _______________________________________________________________________   Take these medicines the morning of surgery with A SIP OF WATER: lopressor,amlodipine                                You may not have any metal on your body including and              piercings  Do not wear jewelry,, lotions, powders or perfumes, deodorant           ry.              Men may shave face and neck.   Do not bring valuables to the hospital. [facility] IS NOT             RESPONSIBLE   FOR VALUABLES.  Contacts, dentures or bridgework may not be worn into surgery.        Name and phone number of your driver:   Special Instructions: N/A              Please read over the following fact sheets you were given: _____________________________________________________________________

## 2019-02-11 ENCOUNTER — Other Ambulatory Visit (HOSPITAL_COMMUNITY)
Admission: RE | Admit: 2019-02-11 | Discharge: 2019-02-11 | Disposition: A | Discharge status: Home / Self Care | Payer: Medicare HMO | Source: Ambulatory Visit | Attending: Thoracic Surgery (Cardiothoracic Vascular Surgery) | Admitting: Thoracic Surgery (Cardiothoracic Vascular Surgery)

## 2019-02-11 DIAGNOSIS — R9431 Abnormal electrocardiogram [ECG] [EKG]: Secondary | ICD-10-CM | POA: Diagnosis not present

## 2019-02-11 DIAGNOSIS — Z1159 Encounter for screening for other viral diseases: Secondary | ICD-10-CM | POA: Diagnosis not present

## 2019-02-11 DIAGNOSIS — Z01818 Encounter for other preprocedural examination: Secondary | ICD-10-CM | POA: Diagnosis not present

## 2019-02-11 DIAGNOSIS — R001 Bradycardia, unspecified: Secondary | ICD-10-CM | POA: Diagnosis not present

## 2019-02-11 DIAGNOSIS — I4510 Unspecified right bundle-branch block: Secondary | ICD-10-CM | POA: Diagnosis not present

## 2019-02-11 DIAGNOSIS — M1711 Unilateral primary osteoarthritis, right knee: Secondary | ICD-10-CM | POA: Diagnosis not present

## 2019-02-12 LAB — NOVEL CORONAVIRUS, NAA (HOSP ORDER, SEND-OUT TO REF LAB; TAT 18-24 HRS): SARS-CoV-2, NAA: NOT DETECTED

## 2019-02-14 NOTE — H&P (Signed)
TOTAL KNEE ADMISSION H&P  Patient is being admitted for right total knee arthroplasty.  Subjective:  Chief Complaint:   Right knee primary OA / pain  HPI: Hector [name], 78 y.o. male, has a history of pain and functional disability in the right knee due to arthritis and has failed non-surgical conservative treatments for greater than 12 weeks to include NSAID's and/or analgesics, corticosteriod injections, viscosupplementation injections, use of assistive devices and activity modification.  Onset of symptoms was gradual, starting >10 years ago with gradually worsening course since that time. The patient noted prior procedures on the knee to include  arthroscopy and menisectomy on the right knee(s).  Patient currently rates pain in the right knee(s) at 8 out of 10 with activity. Patient has night pain, worsening of pain with activity and weight bearing, pain that interferes with activities of daily living, pain with passive range of motion, crepitus and joint swelling.  Patient has evidence of periarticular osteophytes and joint space narrowing by imaging studies. There is no active infection.  Risks, benefits and expectations were discussed with the patient.  Risks including but not limited to the risk of anesthesia, blood clots, nerve damage, blood vessel damage, failure of the prosthesis, infection and up to and including death.  Patient understand the risks, benefits and expectations and wishes to proceed with surgery.   PCP: Merlyn AlbertLuking, William S, MD  D/C Plans:       Home   Post-op Meds:       No Rx given  Tranexamic Acid:      To be given - IV   Decadron:      Is to be given  FYI:      ASA  Norco  DME:    Rx given for - 3-n-1  PT:   OPPT Rx given  Pharmacy:  [facility]   Patient Active Problem List   Diagnosis Date Noted  . Diverticulosis of colon without hemorrhage   . S/P hip replacement 07/24/2014  . Essential hypertension, benign 01/18/2013  . Other and unspecified  hyperlipidemia 01/18/2013  . Impaired fasting glucose 01/18/2013  . Sensory neuropathy 01/18/2013  . Erectile dysfunction 01/18/2013   Past Medical History:  Diagnosis Date  . Arthritis    PAIN AND OA LEFT HIP; ARTHRITIS BOTH KNEES  . Chronic hip pain    LEFT HIP  . ED (erectile dysfunction)   . Hyperlipidemia   . Hypertension    at age 89  . IFG (impaired fasting glucose)   . Noncompliance   . Obesity     Past Surgical History:  Procedure Laterality Date  . BILATERAL KNEE ARTHROSCOPY    . CHOLECYSTECTOMY  2010  . COLONOSCOPY N/A 03/21/2015   Procedure: COLONOSCOPY;  Surgeon: Corbin Adeobert M Rourk, MD;  Location: [facility];  Service: Endoscopy;  Laterality: N/A;  9:30 AM  . HIP FRACTURE SURGERY  2009   LEFT HIP  . KNEE ARTHROTOMY BILATERAL 50 + YRS AGO    . REFRACTIVE SURGERY Bilateral    LASIK  . TOTAL HIP ARTHROPLASTY Left 07/24/2014   Procedure: CONVERSION OF PREVIOUS HIP SURGERY LEFT HIP ARTHROPLASTY;  Surgeon: Shelda PalMatthew D Olin, MD;  Location: [facility];  Service: Orthopedics;  Laterality: Left;    No current facility-administered medications for this encounter.    Current Outpatient Medications  Medication Sig Dispense Refill Last Dose  . amLODipine (NORVASC) 10 MG tablet TAKE 1 TABLET (10 MG TOTAL) BY MOUTH DAILY. 90 tablet 1   . benazepril (  LOTENSIN) 40 MG tablet TAKE 1 TABLET TWICE DAILY 180 tablet 1   . gemfibrozil (LOPID) 600 MG tablet TAKE 1 TABLET TWICE DAILY BEFORE MEALS (Patient taking differently: Take 600 mg by mouth 2 (two) times daily before a meal. TAKE 1 TABLET TWICE DAILY BEFORE MEALS) 180 tablet 1   . metoprolol tartrate (LOPRESSOR) 50 MG tablet TAKE 1 TABLET (50 MG TOTAL) BY MOUTH 2 (TWO) TIMES DAILY. 180 tablet 1    Allergies  Allergen Reactions  . Hctz [Hydrochlorothiazide]     Erectile Dysfunction  . Lipitor [Atorvastatin]     Muscle and joint pain    Social History   Tobacco Use  . Smoking status: Former Smoker    Types: Pipe    Last attempt to  quit: 1963    Years since quitting: 57.4  . Smokeless tobacco: Never Used  . Tobacco comment: quit 40-50 years ago  Substance Use Topics  . Alcohol use: No    Comment: QUIT SMOKING AGE 38    Family History  Problem Relation Age of Onset  . Hypertension Mother   . Diabetes Mother   . Hypertension Father   . Heart attack Father   . Diabetes Sister      Review of Systems  Constitutional: Negative.   HENT: Negative.   Eyes: Negative.   Respiratory: Negative.   Cardiovascular: Negative.   Gastrointestinal: Negative.   Genitourinary: Negative.   Musculoskeletal: Positive for joint pain.  Skin: Negative.   Neurological: Negative.   Endo/Heme/Allergies: Negative.   Psychiatric/Behavioral: Positive for memory loss.    Objective:  Physical Exam  Constitutional: He is oriented to person, place, and time. He appears well-developed.  HENT:  Head: Normocephalic.  Eyes: Pupils are equal, round, and reactive to light.  Neck: Neck supple. No JVD present. No tracheal deviation present. No thyromegaly present.  Cardiovascular: Normal rate, regular rhythm and intact distal pulses.  Respiratory: Effort normal and breath sounds normal. No respiratory distress. He has no wheezes.  GI: Soft. There is no abdominal tenderness. There is no guarding.  Musculoskeletal:     Right knee: He exhibits decreased range of motion, swelling and bony tenderness. He exhibits no ecchymosis, no deformity, no laceration and no erythema. Tenderness found.  Lymphadenopathy:    He has no cervical adenopathy.  Neurological: He is alert and oriented to person, place, and time.  Skin: Skin is warm and dry.  Psychiatric: He has a normal mood and affect.     Labs:   Estimated body mass index is 33.53 kg/m as calculated from the following:   Height as of 02/09/19:  (1.803 m).   Weight as of 02/09/19: 109.1 kg.   Imaging Review Plain radiographs demonstrate severe degenerative joint disease of the  right knee.  The bone quality appears to be good for age and reported activity level.      Assessment/Plan:  End stage arthritis, right knee   The patient history, physical examination, clinical judgment of the provider and imaging studies are consistent with end stage degenerative joint disease of the right knee(s) and total knee arthroplasty is deemed medically necessary. The treatment options including medical management, injection therapy arthroscopy and arthroplasty were discussed at length. The risks and benefits of total knee arthroplasty were presented and reviewed. The risks due to aseptic loosening, infection, stiffness, patella tracking problems, thromboembolic complications and other imponderables were discussed. The patient acknowledged the explanation, agreed to proceed with the plan and consent was signed. Patient is being  admitted for inpatient treatment for surgery, pain control, PT, OT, prophylactic antibiotics, VTE prophylaxis, progressive ambulation and ADL's and discharge planning. The patient is planning to be discharged home.    Patient's anticipated LOS is less than 2 midnights, meeting these requirements: - Lives within 1 hour of care - Has a competent adult at home to recover with post-op recover - NO history of  - Chronic pain requiring opiods  - Diabetes  - Coronary Artery Disease  - Heart failure  - Heart attack  - Stroke  - DVT/VTE  - Cardiac arrhythmia  - Respiratory Failure/COPD  - Renal failure  - Anemia  - Advanced Liver disease      Anastasio Auerbach. Mabel Roll   PA-C  02/14/2019, 9:38 PM

## 2019-02-15 ENCOUNTER — Other Ambulatory Visit: Payer: Self-pay

## 2019-02-15 ENCOUNTER — Observation Stay (HOSPITAL_COMMUNITY)
Admission: RE | Admit: 2019-02-15 | Discharge: 2019-02-16 | Disposition: A | Discharge status: Home / Self Care | Payer: Medicare HMO | Attending: Orthopedic Surgery | Admitting: Orthopedic Surgery

## 2019-02-15 ENCOUNTER — Encounter (HOSPITAL_COMMUNITY)
Admission: RE | Disposition: A | Discharge status: Home / Self Care | Payer: Self-pay | Source: Home / Self Care | Attending: Orthopedic Surgery

## 2019-02-15 ENCOUNTER — Encounter (HOSPITAL_COMMUNITY): Payer: Self-pay | Admitting: Emergency Medicine

## 2019-02-15 ENCOUNTER — Ambulatory Visit (HOSPITAL_COMMUNITY): Payer: Medicare HMO | Admitting: Certified Registered"

## 2019-02-15 ENCOUNTER — Ambulatory Visit (HOSPITAL_COMMUNITY): Payer: Medicare HMO | Admitting: Physician Assistant

## 2019-02-15 DIAGNOSIS — Z96651 Presence of right artificial knee joint: Secondary | ICD-10-CM

## 2019-02-15 DIAGNOSIS — G8918 Other acute postprocedural pain: Secondary | ICD-10-CM | POA: Diagnosis not present

## 2019-02-15 DIAGNOSIS — Z8249 Family history of ischemic heart disease and other diseases of the circulatory system: Secondary | ICD-10-CM | POA: Insufficient documentation

## 2019-02-15 DIAGNOSIS — Z79899 Other long term (current) drug therapy: Secondary | ICD-10-CM | POA: Diagnosis not present

## 2019-02-15 DIAGNOSIS — Z833 Family history of diabetes mellitus: Secondary | ICD-10-CM | POA: Diagnosis not present

## 2019-02-15 DIAGNOSIS — I10 Essential (primary) hypertension: Secondary | ICD-10-CM | POA: Diagnosis not present

## 2019-02-15 DIAGNOSIS — Z7982 Long term (current) use of aspirin: Secondary | ICD-10-CM | POA: Diagnosis not present

## 2019-02-15 DIAGNOSIS — Z87891 Personal history of nicotine dependence: Secondary | ICD-10-CM | POA: Insufficient documentation

## 2019-02-15 DIAGNOSIS — K5730 Diverticulosis of large intestine without perforation or abscess without bleeding: Secondary | ICD-10-CM | POA: Insufficient documentation

## 2019-02-15 DIAGNOSIS — Z9049 Acquired absence of other specified parts of digestive tract: Secondary | ICD-10-CM | POA: Diagnosis not present

## 2019-02-15 DIAGNOSIS — Z96642 Presence of left artificial hip joint: Secondary | ICD-10-CM | POA: Insufficient documentation

## 2019-02-15 DIAGNOSIS — E669 Obesity, unspecified: Secondary | ICD-10-CM | POA: Diagnosis not present

## 2019-02-15 DIAGNOSIS — M1711 Unilateral primary osteoarthritis, right knee: Principal | ICD-10-CM | POA: Insufficient documentation

## 2019-02-15 DIAGNOSIS — R7301 Impaired fasting glucose: Secondary | ICD-10-CM | POA: Insufficient documentation

## 2019-02-15 DIAGNOSIS — E785 Hyperlipidemia, unspecified: Secondary | ICD-10-CM | POA: Diagnosis not present

## 2019-02-15 DIAGNOSIS — Z6833 Body mass index (BMI) 33.0-33.9, adult: Secondary | ICD-10-CM | POA: Diagnosis not present

## 2019-02-15 HISTORY — PX: TOTAL KNEE ARTHROPLASTY: SHX125

## 2019-02-15 LAB — TYPE AND SCREEN
ABO/RH(D): A POS
Antibody Screen: NEGATIVE

## 2019-02-15 SURGERY — ARTHROPLASTY, KNEE, TOTAL
Anesthesia: Regional | Laterality: Right

## 2019-02-15 MED ORDER — ONDANSETRON HCL 4 MG/2ML IJ SOLN
INTRAMUSCULAR | Status: DC | PRN
  Administered 2019-02-15: 4 mg via INTRAVENOUS

## 2019-02-15 MED ORDER — SODIUM CHLORIDE (PF) 0.9 % IJ SOLN
INTRAMUSCULAR | Status: DC | PRN
  Administered 2019-02-15: 30 mL

## 2019-02-15 MED ORDER — HYDROMORPHONE HCL 1 MG/ML IJ SOLN: 0.5000 mg | INTRAMUSCULAR | Status: DC | PRN

## 2019-02-15 MED ORDER — MENTHOL 3 MG MT LOZG: 1.0000 | LOZENGE | OROMUCOSAL | Status: DC | PRN

## 2019-02-15 MED ORDER — METOCLOPRAMIDE HCL 5 MG PO TABS: 5.0000 mg | ORAL_TABLET | Freq: Three times a day (TID) | ORAL | Status: DC | PRN

## 2019-02-15 MED ORDER — CEFAZOLIN SODIUM-DEXTROSE 2-4 GM/100ML-% IV SOLN
2.0000 g | INTRAVENOUS | Status: AC
  Administered 2019-02-15: 2 g via INTRAVENOUS
  Filled 2019-02-15: qty 100

## 2019-02-15 MED ORDER — LACTATED RINGERS IV SOLN
INTRAVENOUS | Status: DC
  Administered 2019-02-15: 08:00:00 via INTRAVENOUS

## 2019-02-15 MED ORDER — HYDROCODONE-ACETAMINOPHEN 5-325 MG PO TABS
1.0000 | ORAL_TABLET | ORAL | Status: DC | PRN
  Administered 2019-02-15 (×2): 2 via ORAL
  Filled 2019-02-15 (×2): qty 2

## 2019-02-15 MED ORDER — KETOROLAC TROMETHAMINE 30 MG/ML IJ SOLN
INTRAMUSCULAR | Status: AC
  Filled 2019-02-15: qty 1

## 2019-02-15 MED ORDER — ASPIRIN 81 MG PO CHEW
81.0000 mg | CHEWABLE_TABLET | Freq: Two times a day (BID) | ORAL | Status: DC
  Administered 2019-02-15 – 2019-02-16 (×2): 81 mg via ORAL
  Filled 2019-02-15 (×2): qty 1

## 2019-02-15 MED ORDER — MIDAZOLAM HCL 2 MG/2ML IJ SOLN
1.0000 mg | INTRAMUSCULAR | Status: DC
  Administered 2019-02-15 (×2): 1 mg via INTRAVENOUS
  Filled 2019-02-15: qty 2

## 2019-02-15 MED ORDER — CELECOXIB 200 MG PO CAPS
200.0000 mg | ORAL_CAPSULE | Freq: Two times a day (BID) | ORAL | Status: DC
  Administered 2019-02-15: 23:00:00 200 mg via ORAL
  Filled 2019-02-15: qty 1

## 2019-02-15 MED ORDER — ASPIRIN 81 MG PO CHEW: 81.0000 mg | CHEWABLE_TABLET | Freq: Two times a day (BID) | ORAL | 0 refills | Status: DC

## 2019-02-15 MED ORDER — FERROUS SULFATE 325 (65 FE) MG PO TABS
325.0000 mg | ORAL_TABLET | Freq: Two times a day (BID) | ORAL | Status: DC
  Administered 2019-02-16: 09:00:00 325 mg via ORAL
  Filled 2019-02-15: qty 1

## 2019-02-15 MED ORDER — PROPOFOL 10 MG/ML IV BOLUS
INTRAVENOUS | Status: AC
  Filled 2019-02-15: qty 20

## 2019-02-15 MED ORDER — BUPIVACAINE-EPINEPHRINE (PF) 0.25% -1:200000 IJ SOLN
INTRAMUSCULAR | Status: AC
  Filled 2019-02-15: qty 30

## 2019-02-15 MED ORDER — CHLORHEXIDINE GLUCONATE 4 % EX LIQD: 60.0000 mL | Freq: Once | CUTANEOUS | Status: DC

## 2019-02-15 MED ORDER — POVIDONE-IODINE 10 % EX SWAB
2.0000 "application " | Freq: Once | CUTANEOUS | Status: AC
  Administered 2019-02-15: 2 via TOPICAL

## 2019-02-15 MED ORDER — ONDANSETRON HCL 4 MG PO TABS: 4.0000 mg | ORAL_TABLET | Freq: Four times a day (QID) | ORAL | Status: DC | PRN

## 2019-02-15 MED ORDER — BUPIVACAINE HCL (PF) 0.5 % IJ SOLN
INTRAMUSCULAR | Status: DC | PRN
  Administered 2019-02-15: 20 mL via PERINEURAL

## 2019-02-15 MED ORDER — PHENOL 1.4 % MT LIQD: 1.0000 | OROMUCOSAL | Status: DC | PRN

## 2019-02-15 MED ORDER — AMLODIPINE BESYLATE 10 MG PO TABS
10.0000 mg | ORAL_TABLET | Freq: Every day | ORAL | Status: DC
  Administered 2019-02-16: 10 mg via ORAL
  Filled 2019-02-15: qty 1

## 2019-02-15 MED ORDER — DOCUSATE SODIUM 100 MG PO CAPS
100.0000 mg | ORAL_CAPSULE | Freq: Two times a day (BID) | ORAL | Status: DC
  Administered 2019-02-15 – 2019-02-16 (×2): 100 mg via ORAL
  Filled 2019-02-15 (×2): qty 1

## 2019-02-15 MED ORDER — METHOCARBAMOL 500 MG IVPB - SIMPLE MED
500.0000 mg | Freq: Four times a day (QID) | INTRAVENOUS | Status: DC | PRN
  Filled 2019-02-15: qty 50

## 2019-02-15 MED ORDER — METOCLOPRAMIDE HCL 5 MG/ML IJ SOLN: 5.0000 mg | Freq: Three times a day (TID) | INTRAMUSCULAR | Status: DC | PRN

## 2019-02-15 MED ORDER — METHOCARBAMOL 500 MG PO TABS: 500.0000 mg | ORAL_TABLET | Freq: Four times a day (QID) | ORAL | Status: DC | PRN

## 2019-02-15 MED ORDER — FERROUS SULFATE 325 (65 FE) MG PO TABS: 325.0000 mg | ORAL_TABLET | Freq: Three times a day (TID) | ORAL | 3 refills | Status: DC

## 2019-02-15 MED ORDER — SODIUM CHLORIDE 0.9 % IR SOLN
Status: DC | PRN
  Administered 2019-02-15: 1000 mL

## 2019-02-15 MED ORDER — BUPIVACAINE-EPINEPHRINE (PF) 0.25% -1:200000 IJ SOLN
INTRAMUSCULAR | Status: DC | PRN
  Administered 2019-02-15: 30 mL

## 2019-02-15 MED ORDER — CEFAZOLIN SODIUM-DEXTROSE 2-4 GM/100ML-% IV SOLN
2.0000 g | Freq: Four times a day (QID) | INTRAVENOUS | Status: AC
  Administered 2019-02-15 (×2): 2 g via INTRAVENOUS
  Filled 2019-02-15 (×2): qty 100

## 2019-02-15 MED ORDER — HYDROCODONE-ACETAMINOPHEN 7.5-325 MG PO TABS
1.0000 | ORAL_TABLET | ORAL | Status: DC | PRN
  Administered 2019-02-16: 10:00:00 2 via ORAL
  Filled 2019-02-15: qty 2

## 2019-02-15 MED ORDER — SODIUM CHLORIDE (PF) 0.9 % IJ SOLN
INTRAMUSCULAR | Status: AC
  Filled 2019-02-15: qty 50

## 2019-02-15 MED ORDER — GEMFIBROZIL 600 MG PO TABS
600.0000 mg | ORAL_TABLET | Freq: Two times a day (BID) | ORAL | Status: DC
  Administered 2019-02-16: 09:00:00 600 mg via ORAL
  Filled 2019-02-15 (×2): qty 1

## 2019-02-15 MED ORDER — PROPOFOL 10 MG/ML IV BOLUS
INTRAVENOUS | Status: DC | PRN
  Administered 2019-02-15: 20 mg via INTRAVENOUS

## 2019-02-15 MED ORDER — ALUM & MAG HYDROXIDE-SIMETH 200-200-20 MG/5ML PO SUSP: 15.0000 mL | ORAL | Status: DC | PRN

## 2019-02-15 MED ORDER — EPHEDRINE SULFATE-NACL 50-0.9 MG/10ML-% IV SOSY
PREFILLED_SYRINGE | INTRAVENOUS | Status: DC | PRN
  Administered 2019-02-15: 5 mg via INTRAVENOUS
  Administered 2019-02-15: 10 mg via INTRAVENOUS
  Administered 2019-02-15: 5 mg via INTRAVENOUS

## 2019-02-15 MED ORDER — DEXAMETHASONE SODIUM PHOSPHATE 10 MG/ML IJ SOLN
INTRAMUSCULAR | Status: AC
  Filled 2019-02-15: qty 1

## 2019-02-15 MED ORDER — BUPIVACAINE IN DEXTROSE 0.75-8.25 % IT SOLN
INTRATHECAL | Status: DC | PRN
  Administered 2019-02-15: 1.6 mL via INTRATHECAL

## 2019-02-15 MED ORDER — ACETAMINOPHEN 500 MG PO TABS
1000.0000 mg | ORAL_TABLET | Freq: Once | ORAL | Status: AC
  Administered 2019-02-15: 08:00:00 1000 mg via ORAL
  Filled 2019-02-15: qty 2

## 2019-02-15 MED ORDER — DEXAMETHASONE SODIUM PHOSPHATE 10 MG/ML IJ SOLN
10.0000 mg | Freq: Once | INTRAMUSCULAR | Status: AC
  Administered 2019-02-15: 10:00:00 10 mg via INTRAVENOUS

## 2019-02-15 MED ORDER — ACETAMINOPHEN 325 MG PO TABS: 325.0000 mg | ORAL_TABLET | Freq: Four times a day (QID) | ORAL | Status: DC | PRN

## 2019-02-15 MED ORDER — ONDANSETRON HCL 4 MG/2ML IJ SOLN
INTRAMUSCULAR | Status: AC
  Filled 2019-02-15: qty 2

## 2019-02-15 MED ORDER — DOCUSATE SODIUM 100 MG PO CAPS: 100.0000 mg | ORAL_CAPSULE | Freq: Two times a day (BID) | ORAL | 0 refills | Status: DC

## 2019-02-15 MED ORDER — 0.9 % SODIUM CHLORIDE (POUR BTL) OPTIME
TOPICAL | Status: DC | PRN
  Administered 2019-02-15: 1000 mL

## 2019-02-15 MED ORDER — DEXAMETHASONE SODIUM PHOSPHATE 10 MG/ML IJ SOLN
10.0000 mg | Freq: Once | INTRAMUSCULAR | Status: AC
  Administered 2019-02-16: 10 mg via INTRAVENOUS
  Filled 2019-02-15: qty 1

## 2019-02-15 MED ORDER — PROPOFOL 10 MG/ML IV BOLUS
INTRAVENOUS | Status: AC
  Filled 2019-02-15: qty 60

## 2019-02-15 MED ORDER — POLYETHYLENE GLYCOL 3350 17 G PO PACK
17.0000 g | PACK | Freq: Two times a day (BID) | ORAL | Status: DC
  Administered 2019-02-15 – 2019-02-16 (×2): 17 g via ORAL
  Filled 2019-02-15 (×2): qty 1

## 2019-02-15 MED ORDER — DIPHENHYDRAMINE HCL 12.5 MG/5ML PO ELIX: 12.5000 mg | ORAL_SOLUTION | ORAL | Status: DC | PRN

## 2019-02-15 MED ORDER — TRANEXAMIC ACID-NACL 1000-0.7 MG/100ML-% IV SOLN
1000.0000 mg | INTRAVENOUS | Status: AC
  Administered 2019-02-15: 1000 mg via INTRAVENOUS
  Filled 2019-02-15: qty 100

## 2019-02-15 MED ORDER — POLYETHYLENE GLYCOL 3350 17 G PO PACK: 17.0000 g | PACK | Freq: Two times a day (BID) | ORAL | 0 refills | Status: DC

## 2019-02-15 MED ORDER — KETOROLAC TROMETHAMINE 30 MG/ML IJ SOLN
INTRAMUSCULAR | Status: DC | PRN
  Administered 2019-02-15: 30 mg via INTRA_ARTICULAR

## 2019-02-15 MED ORDER — TRANEXAMIC ACID-NACL 1000-0.7 MG/100ML-% IV SOLN
1000.0000 mg | Freq: Once | INTRAVENOUS | Status: AC
  Administered 2019-02-15: 15:00:00 1000 mg via INTRAVENOUS
  Filled 2019-02-15: qty 100

## 2019-02-15 MED ORDER — ONDANSETRON HCL 4 MG/2ML IJ SOLN: 4.0000 mg | Freq: Four times a day (QID) | INTRAMUSCULAR | Status: DC | PRN

## 2019-02-15 MED ORDER — EPHEDRINE 5 MG/ML INJ
INTRAVENOUS | Status: AC
  Filled 2019-02-15: qty 10

## 2019-02-15 MED ORDER — METHOCARBAMOL 500 MG PO TABS: 500.0000 mg | ORAL_TABLET | Freq: Four times a day (QID) | ORAL | 0 refills | Status: DC | PRN

## 2019-02-15 MED ORDER — PROPOFOL 500 MG/50ML IV EMUL
INTRAVENOUS | Status: DC | PRN
  Administered 2019-02-15: 50 ug/kg/min via INTRAVENOUS

## 2019-02-15 MED ORDER — MAGNESIUM CITRATE PO SOLN: 1.0000 | Freq: Once | ORAL | Status: DC | PRN

## 2019-02-15 MED ORDER — FENTANYL CITRATE (PF) 100 MCG/2ML IJ SOLN: 25.0000 ug | INTRAMUSCULAR | Status: DC | PRN

## 2019-02-15 MED ORDER — SODIUM CHLORIDE 0.9 % IV SOLN
INTRAVENOUS | Status: DC
  Administered 2019-02-15: 15:00:00 via INTRAVENOUS

## 2019-02-15 MED ORDER — STERILE WATER FOR IRRIGATION IR SOLN
Status: DC | PRN
  Administered 2019-02-15: 2000 mL

## 2019-02-15 MED ORDER — HYDROCODONE-ACETAMINOPHEN 7.5-325 MG PO TABS: 1.0000 | ORAL_TABLET | ORAL | 0 refills | Status: DC | PRN

## 2019-02-15 MED ORDER — METOPROLOL TARTRATE 50 MG PO TABS
50.0000 mg | ORAL_TABLET | Freq: Two times a day (BID) | ORAL | Status: DC
  Administered 2019-02-15 – 2019-02-16 (×2): 50 mg via ORAL
  Filled 2019-02-15 (×2): qty 1

## 2019-02-15 MED ORDER — CLONIDINE HCL (ANALGESIA) 100 MCG/ML EP SOLN
EPIDURAL | Status: DC | PRN
  Administered 2019-02-15: 100 ug

## 2019-02-15 MED ORDER — LIDOCAINE 2% (20 MG/ML) 5 ML SYRINGE
INTRAMUSCULAR | Status: AC
  Filled 2019-02-15: qty 5

## 2019-02-15 MED ORDER — FENTANYL CITRATE (PF) 100 MCG/2ML IJ SOLN
50.0000 ug | INTRAMUSCULAR | Status: DC
  Administered 2019-02-15: 09:00:00 50 ug via INTRAVENOUS
  Filled 2019-02-15: qty 2

## 2019-02-15 MED ORDER — BISACODYL 10 MG RE SUPP: 10.0000 mg | Freq: Every day | RECTAL | Status: DC | PRN

## 2019-02-15 SURGICAL SUPPLY — 64 items
ATTUNE MED ANAT PAT 38 KNEE (Knees) ×1 IMPLANT
ATTUNE MED ANAT PAT 38MM KNEE (Knees) ×1 IMPLANT
ATTUNE PS FEM RT SZ 6 CEM KNEE (Femur) ×2 IMPLANT
ATTUNE PSRP INSR SZ6 7 KNEE (Insert) ×1 IMPLANT
ATTUNE PSRP INSR SZ6 7MM KNEE (Insert) ×1 IMPLANT
BAG ZIPLOCK 12X15 (MISCELLANEOUS) IMPLANT
BANDAGE ACE 6X5 VEL STRL LF (GAUZE/BANDAGES/DRESSINGS) ×5 IMPLANT
BASE TIBIAL ROT PLAT SZ 8 KNEE (Knees) IMPLANT
BLADE SAW SGTL 11.0X1.19X90.0M (BLADE) IMPLANT
BLADE SAW SGTL 13.0X1.19X90.0M (BLADE) ×3 IMPLANT
BLADE SURG SZ10 CARB STEEL (BLADE) ×6 IMPLANT
BOWL SMART MIX CTS (DISPOSABLE) ×3 IMPLANT
CEMENT HV SMART SET (Cement) ×4 IMPLANT
COVER SURGICAL LIGHT HANDLE (MISCELLANEOUS) ×3 IMPLANT
COVER WAND RF STERILE (DRAPES) IMPLANT
CUFF TOURN SGL QUICK 34 (TOURNIQUET CUFF) ×2
CUFF TRNQT CYL 34X4.125X (TOURNIQUET CUFF) ×1 IMPLANT
DECANTER SPIKE VIAL GLASS SM (MISCELLANEOUS) ×6 IMPLANT
DERMABOND ADVANCED (GAUZE/BANDAGES/DRESSINGS) ×2
DERMABOND ADVANCED .7 DNX12 (GAUZE/BANDAGES/DRESSINGS) ×1 IMPLANT
DRAPE U-SHAPE 47X51 STRL (DRAPES) ×3 IMPLANT
DRESSING AQUACEL AG SP 3.5X10 (GAUZE/BANDAGES/DRESSINGS) ×1 IMPLANT
DRSG AQUACEL AG ADV 3.5X10 (GAUZE/BANDAGES/DRESSINGS) ×2 IMPLANT
DRSG AQUACEL AG SP 3.5X10 (GAUZE/BANDAGES/DRESSINGS) ×3
DURAPREP 26ML APPLICATOR (WOUND CARE) ×6 IMPLANT
ELECT REM PT RETURN 15FT ADLT (MISCELLANEOUS) ×3 IMPLANT
GLOVE BIO SURGEON STRL SZ 6 (GLOVE) ×3 IMPLANT
GLOVE BIOGEL PI IND STRL 6.5 (GLOVE) ×1 IMPLANT
GLOVE BIOGEL PI IND STRL 7.5 (GLOVE) ×1 IMPLANT
GLOVE BIOGEL PI IND STRL 8.5 (GLOVE) ×1 IMPLANT
GLOVE BIOGEL PI INDICATOR 6.5 (GLOVE) ×2
GLOVE BIOGEL PI INDICATOR 7.5 (GLOVE) ×2
GLOVE BIOGEL PI INDICATOR 8.5 (GLOVE) ×2
GLOVE ECLIPSE 8.0 STRL XLNG CF (GLOVE) ×3 IMPLANT
GLOVE ORTHO TXT STRL SZ7.5 (GLOVE) ×3 IMPLANT
GOWN STRL REUS W/ TWL LRG LVL3 (GOWN DISPOSABLE) ×1 IMPLANT
GOWN STRL REUS W/TWL 2XL LVL3 (GOWN DISPOSABLE) ×3 IMPLANT
GOWN STRL REUS W/TWL LRG LVL3 (GOWN DISPOSABLE) ×5 IMPLANT
HANDPIECE INTERPULSE COAX TIP (DISPOSABLE) ×2
HOLDER FOLEY CATH W/STRAP (MISCELLANEOUS) IMPLANT
KIT TURNOVER KIT A (KITS) IMPLANT
MANIFOLD NEPTUNE II (INSTRUMENTS) ×3 IMPLANT
NDL SAFETY ECLIPSE 18X1.5 (NEEDLE) IMPLANT
NEEDLE HYPO 18GX1.5 SHARP (NEEDLE)
NS IRRIG 1000ML POUR BTL (IV SOLUTION) ×3 IMPLANT
PACK TOTAL KNEE CUSTOM (KITS) ×3 IMPLANT
PIN FIX SIGMA HP QUICK REL (PIN) ×2 IMPLANT
PIN THREADED HEADED SIGMA (PIN) ×2 IMPLANT
PROTECTOR NERVE ULNAR (MISCELLANEOUS) ×3 IMPLANT
SET HNDPC FAN SPRY TIP SCT (DISPOSABLE) ×1 IMPLANT
SET PAD KNEE POSITIONER (MISCELLANEOUS) ×3 IMPLANT
SUT MNCRL AB 4-0 PS2 18 (SUTURE) ×3 IMPLANT
SUT STRATAFIX 0 PDS 27 VIOLET (SUTURE) ×3
SUT STRATAFIX PDS+ 0 24IN (SUTURE) ×3 IMPLANT
SUT VIC AB 1 CT1 36 (SUTURE) ×3 IMPLANT
SUT VIC AB 2-0 CT1 27 (SUTURE) ×6
SUT VIC AB 2-0 CT1 TAPERPNT 27 (SUTURE) ×3 IMPLANT
SUTURE STRATFX 0 PDS 27 VIOLET (SUTURE) IMPLANT
SYR 3ML LL SCALE MARK (SYRINGE) ×3 IMPLANT
TIBIAL BASE ROT PLAT SZ 8 KNEE (Knees) ×3 IMPLANT
TRAY FOLEY MTR SLVR 16FR STAT (SET/KITS/TRAYS/PACK) ×3 IMPLANT
WATER STERILE IRR 1000ML POUR (IV SOLUTION) ×6 IMPLANT
WRAP KNEE MAXI GEL POST OP (GAUZE/BANDAGES/DRESSINGS) ×3 IMPLANT
YANKAUER SUCT BULB TIP 10FT TU (MISCELLANEOUS) ×3 IMPLANT

## 2019-02-15 NOTE — Anesthesia Procedure Notes (Signed)
Procedure Name: MAC Date/Time: 02/15/2019 10:04 AM Performed by: Eben Burow, CRNA Pre-anesthesia Checklist: Patient identified, Emergency Drugs available, Suction available, Patient being monitored and Timeout performed Oxygen Delivery Method: Simple face mask Dental Injury: Teeth and Oropharynx as per pre-operative assessment

## 2019-02-15 NOTE — Anesthesia Preprocedure Evaluation (Addendum)
Anesthesia Evaluation  Patient identified by MRN, date of birth, ID band Patient awake    Reviewed: Allergy & Precautions, NPO status , Patient's Chart, lab work & pertinent test results, reviewed documented beta blocker date and time   Airway Mallampati: III  TM Distance: >3 FB Neck ROM: Full  Mouth opening: Limited Mouth Opening  Dental no notable dental hx. (+) Partial Lower, Partial Upper   Pulmonary neg pulmonary ROS, former smoker,    Pulmonary exam normal breath sounds clear to auscultation       Cardiovascular hypertension, Pt. on home beta blockers Normal cardiovascular exam Rhythm:Regular Rate:Normal     Neuro/Psych negative neurological ROS  negative psych ROS   GI/Hepatic negative GI ROS, Neg liver ROS,   Endo/Other  negative endocrine ROS  Renal/GU negative Renal ROS  negative genitourinary   Musculoskeletal  (+) Arthritis , Osteoarthritis,    Abdominal   Peds negative pediatric ROS (+)  Hematology negative hematology ROS (+)   Anesthesia Other Findings   Reproductive/Obstetrics negative OB ROS                            Anesthesia Physical Anesthesia Plan  ASA: II  Anesthesia Plan: Spinal and Regional   Post-op Pain Management:  Regional for Post-op pain   Induction:   PONV Risk Score and Plan: Treatment may vary due to age or medical condition  Airway Management Planned: Natural Airway  Additional Equipment:   Intra-op Plan:   Post-operative Plan:   Informed Consent: I have reviewed the patients History and Physical, chart, labs and discussed the procedure including the risks, benefits and alternatives for the proposed anesthesia with the patient or authorized representative who has indicated his/her understanding and acceptance.     Dental advisory given  Plan Discussed with: CRNA  Anesthesia Plan Comments:         Anesthesia Quick Evaluation

## 2019-02-15 NOTE — Anesthesia Procedure Notes (Signed)
Anesthesia Regional Block: Adductor canal block   Pre-Anesthetic Checklist: ,, timeout performed, Correct Patient, Correct Site, Correct Laterality, Correct Procedure, Correct Position, site marked, Risks and benefits discussed,  Surgical consent,  Pre-op evaluation,  At surgeon's request and post-op pain management  Laterality: Right  Prep: Maximum Sterile Barrier Precautions used, chloraprep       Needles:  Injection technique: Single-shot  Needle Type: Echogenic Stimulator Needle     Needle Length: 9cm  Needle Gauge: 22     Additional Needles:   Procedures:,,,, ultrasound used (permanent image in chart),,,,  Narrative:  Start time: 02/15/2019 9:19 AM End time: 02/15/2019 9:29 AM Injection made incrementally with aspirations every 5 mL.  Performed by: Personally  Anesthesiologist: Elmer Picker, MD  Additional Notes: Monitors applied. No increased pain on injection. No increased resistance to injection. Injection made in 5cc increments. Good needle visualization. Patient tolerated procedure well.

## 2019-02-15 NOTE — Discharge Instructions (Signed)

## 2019-02-15 NOTE — Anesthesia Postprocedure Evaluation (Signed)
Anesthesia Post Note  Patient: Hector [name]  Procedure(s) Performed: TOTAL KNEE ARTHROPLASTY (Right )     Patient location during evaluation: PACU Anesthesia Type: Regional and Spinal Level of consciousness: oriented and awake and alert Pain management: pain level controlled Vital Signs Assessment: post-procedure vital signs reviewed and stable Respiratory status: spontaneous breathing, respiratory function stable and patient connected to nasal cannula oxygen Cardiovascular status: blood pressure returned to baseline and stable Postop Assessment: no headache, no backache and no apparent nausea or vomiting Anesthetic complications: no    Last Vitals:  Vitals:   02/15/19 1327 02/15/19 1419  BP: 123/60 137/70  Pulse: (!) 54 (!) 55  Resp: 14 16  Temp: 36.7 C   SpO2: 96% 98%    Last Pain:  Vitals:   02/15/19 1443  TempSrc:   PainSc: 5                  Kamaya Keckler L Wadie Liew

## 2019-02-15 NOTE — Evaluation (Signed)
Physical Therapy Evaluation Patient Details Name: Hector [name] MRN: [id number] DOB: 19-Apr-1941 Today's Date: 02/15/2019   History of Present Illness  78 yo male s/p R TKR on 02/15/19. PMH includes L hip surgery with conversion to L THA 2015, OA, HLD, HTN, obesity, diverticulosis, sensory neuropathy.  Clinical Impression  Pt presents with R knee pain, decreased R knee ROM, difficulty performing bed mobility, increased time and effort to perform mobility tasks, and decreased activity tolerance post-surgery. Pt to benefit from acute PT to address deficits. Pt ambulated hallway distance with RW, with min guard assist. Verbal cuing for form and safety provided to pt throughout. Pt educated on ankle pumps (20/hour) to perform this afternoon/evening to increase circulation, to pt's tolerance and limited by pain. PT to progress mobility as tolerated, and will continue to follow acutely.        Follow Up Recommendations Follow surgeon's recommendation for DC plan and follow-up therapies;Supervision for mobility/OOB(OPPT, to begin Thursday or Friday)    Equipment Recommendations  None recommended by PT    Recommendations for Other Services       Precautions / Restrictions Precautions Precautions: Fall Restrictions Weight Bearing Restrictions: No Other Position/Activity Restrictions: WBAT      Mobility  Bed Mobility Overal bed mobility: Needs Assistance Bed Mobility: Supine to Sit     Supine to sit: Min assist;HOB elevated     General bed mobility comments: Min assist for RLE management, pt scooted to EOB without PT assistance with increased time and use of bedrail.  Transfers Overall transfer level: Needs assistance Equipment used: Rolling walker (2 wheeled) Transfers: Sit to/from Stand Sit to Stand: Min guard;From elevated surface         General transfer comment: Min guard for safety, verbal cuing for hand placement during rise from bed, pt steady with use of RW upon  standing.   Ambulation/Gait Ambulation/Gait assistance: Min guard;+2 safety/equipment Gait Distance (Feet): 80 Feet Assistive device: Rolling walker (2 wheeled) Gait Pattern/deviations: Step-to pattern;Decreased step length - right;Decreased step length - left;Trunk flexed Gait velocity: decr    General Gait Details: Min guard for safety, verbal cuing for upright posture, sequencing, placement in RW.   Stairs            Wheelchair Mobility    Modified Rankin (Stroke Patients Only)       Balance Overall balance assessment: Mild deficits observed, not formally tested                                           Pertinent Vitals/Pain Pain Assessment: 0-10 Pain Score: 4  Pain Location: R knee Pain Descriptors / Indicators: Sore Pain Intervention(s): Monitored during session;Repositioned;Limited activity within patient's tolerance;Ice applied    Home Living Family/patient expects to be discharged to:: Private residence Living Arrangements: Spouse/significant other;Other relatives(Per pt, wife has a heart condition so his sister will stay with him initially) Available Help at Discharge: Family;Available 24 hours/day Type of Home: House Home Access: Stairs to enter Entrance Stairs-Rails: None Entrance Stairs-Number of Steps: 1 Home Layout: One level Home Equipment: Emergency planning/management officerhower seat;Walker - 2 wheels;Cane - single point;Bedside commode      Prior Function Level of Independence: Independent               Hand Dominance   Dominant Hand: Right    Extremity/Trunk Assessment   Upper Extremity Assessment Upper Extremity Assessment: [facility]  for tasks assessed    Lower Extremity Assessment Lower Extremity Assessment: Overall WFL for tasks assessed;RLE deficits/detail RLE Deficits / Details: suspected post-surgical weakness; able to perform quad set, SLR without lift assist and with 5* quad lag, heel slide to 75* RLE Sensation: WNL    Cervical /  Trunk Assessment Cervical / Trunk Assessment: Normal  Communication   Communication: HOH  Cognition Arousal/Alertness: Awake/alert Behavior During Therapy: WFL for tasks assessed/performed Overall Cognitive Status: Within Functional Limits for tasks assessed                                        General Comments      Exercises Total Joint Exercises Goniometric ROM: knee aarom ~10-75*, limited by pain    Assessment/Plan    PT Assessment Patient needs continued PT services  PT Problem List Decreased range of motion;Decreased activity tolerance;Decreased knowledge of use of DME;Pain;Decreased strength;Decreased mobility       PT Treatment Interventions DME instruction;Functional mobility training;Patient/family education;Balance training;Gait training;Therapeutic activities;Stair training;Therapeutic exercise    PT Goals (Current goals can be found in the Care Plan section)  Acute Rehab PT Goals Patient Stated Goal: decrease R knee pain, walk better PT Goal Formulation: With patient Time For Goal Achievement: 02/22/19 Potential to Achieve Goals: Good    Frequency 7X/week   Barriers to discharge        Co-evaluation               AM-PAC PT "6 Clicks" Mobility  Outcome Measure Help needed turning from your back to your side while in a flat bed without using bedrails?: A Little Help needed moving from lying on your back to sitting on the side of a flat bed without using bedrails?: A Little Help needed moving to and from a bed to a chair (including a wheelchair)?: A Little Help needed standing up from a chair using your arms (e.g., wheelchair or bedside chair)?: A Little Help needed to walk in hospital room?: A Little Help needed climbing 3-5 steps with a railing? : A Little 6 Click Score: 18    End of Session Equipment Utilized During Treatment: Gait belt Activity Tolerance: Patient tolerated treatment well Patient left: in chair;with chair alarm  set;with call bell/phone within reach;with SCD's reapplied Nurse Communication: Mobility status PT Visit Diagnosis: Other abnormalities of gait and mobility (R26.89);Difficulty in walking, not elsewhere classified (R26.2)    Time: 2575-0518 PT Time Calculation (min) (ACUTE ONLY): 23 min   Charges:   PT Evaluation $PT Eval Low Complexity: 1 Low PT Treatments $Gait Training: 8-22 mins        Nicola Police, PT Acute Rehabilitation Services Pager [phone]  Office [phone]   Hector [name] 02/15/2019, 5:48 PM

## 2019-02-15 NOTE — Op Note (Signed)
NAME:  Hector [name]                      MEDICAL RECORD NO.:  [id number]                             FACILITY:  [facility]      PHYSICIAN:  Madlyn Frankel. Charlann Boxer, M.D.  DATE OF BIRTH:  Mar 24, 1941      DATE OF PROCEDURE:  02/15/2019                                     OPERATIVE REPORT         PREOPERATIVE DIAGNOSIS:  Right knee osteoarthritis.      POSTOPERATIVE DIAGNOSIS:  Right knee osteoarthritis.      FINDINGS:  The patient was noted to have complete loss of cartilage and   bone-on-bone arthritis with associated osteophytes in all three of   the knee more advanced laterally and patellofemoral with a significant synovitis and associated effusion.  The patient had failed months of conservative treatment including medications, injection therapy, activity modification.     PROCEDURE:  Right total knee replacement.      COMPONENTS USED:  DePuy Attune rotating platform posterior stabilized knee   system, a size 6 femur, 8 tibia, size 7 mm PS AOX insert, and 38 anatomic patellar   button.      SURGEON:  Madlyn Frankel. Charlann Boxer, M.D.      ASSISTANT:  Dennie Bible, PA-C.      ANESTHESIA:  Regional and Spinal.      SPECIMENS:  None.      COMPLICATION:  None.      DRAINS:  None.  EBL: <100cc      TOURNIQUET TIME:   Total Tourniquet Time Documented: Thigh (Right) - 38 minutes Total: Thigh (Right) - 38 minutes  .      The patient was stable to the recovery room.      INDICATION FOR PROCEDURE:  Hector [name] is a 78 y.o. male patient of   mine.  The patient had been seen, evaluated, and treated for months conservatively in the   office with medication, activity modification, and injections.  The patient had   radiographic changes of bone-on-bone arthritis with endplate sclerosis and osteophytes noted.  Based on the radiographic changes and failed conservative measures, the patient   decided to proceed with definitive treatment, total knee replacement.  Risks of infection, DVT, component  failure, need for revision surgery, neurovascular injury were reviewed in the office setting.  The postop course was reviewed stressing the efforts to maximize post-operative satisfaction and function.  Consent was obtained for benefit of pain   relief.      PROCEDURE IN DETAIL:  The patient was brought to the operative theater.   Once adequate anesthesia, preoperative antibiotics, 2 gm of Ancef,1 gm of Tranexamic Acid, and 10 mg of Decadron administered, the patient was positioned supine with a right thigh tourniquet placed.  The  right lower extremity was prepped and draped in sterile fashion.  A time-   out was performed identifying the patient, planned procedure, and the appropriate extremity.      The right lower extremity was placed in the [facility] leg holder.  The leg was   exsanguinated, tourniquet elevated to 250 mmHg.  A midline incision was  made followed by median parapatellar arthrotomy.  Following initial   exposure, attention was first directed to the patella.  Precut   measurement was noted to be 26 mm.  I resected down to 15 mm and used a   38 anatomic patellar button to restore patellar height as well as cover the cut surface.      The lug holes were drilled and a metal shim was placed to protect the   patella from retractors and saw blade during the procedure.      At this point, attention was now directed to the femur.  The femoral   canal was opened with a drill, irrigated to try to prevent fat emboli.  An   intramedullary rod was passed at 5 degrees valgus, 10 mm of bone was   resected off the distal femur.  Following this resection, the tibia was   subluxated anteriorly.  Using the extramedullary guide, 4 mm of bone was resected off   the proximal lateral tibia.  We confirmed the gap would be   stable medially and laterally with a size 5 spacer block as well as confirmed that the tibial cut was perpendicular in the coronal plane, checking with an alignment rod.       Once this was done, I sized the femur to be a size 6 in the anterior-   posterior dimension, chose a standard component based on medial and   lateral dimension.  The size 6 rotation block was then pinned in   position anterior referenced using the C-clamp to set rotation.  The   anterior, posterior, and  chamfer cuts were made without difficulty nor   notching making certain that I was along the anterior cortex to help   with flexion gap stability.      The final box cut was made off the lateral aspect of distal femur.      At this point, the tibia was sized to be a size 8.  The size 8 tray was   then pinned in position through the medial third of the tubercle,   drilled, and keel punched.  Trial reduction was now carried with a 6 femur,  8 tibia, a size 6 then to 7 mm PS insert, and the 38 anatomic patella botton.  The knee was brought to full extension with good flexion stability with the patella   tracking through the trochlea without application of pressure.  Given   all these findings the trial components removed.  Final components were   opened and cement was mixed.  The knee was irrigated with normal saline solution and pulse lavage.  The synovial lining was   then injected with 30 cc of 0.25% Marcaine with epinephrine, 1 cc of Toradol and 30 cc of NS for a total of 61 cc.     Final implants were then cemented onto cleaned and dried cut surfaces of bone with the knee brought to extension with a size 7 mm PS trial insert.      Once the cement had fully cured, excess cement was removed   throughout the knee.  I confirmed that I was satisfied with the range of   motion and stability, and the final size 7 mm PS AOX insert was chosen.  It was   placed into the knee.      The tourniquet had been let down at 38 minutes.  No significant   hemostasis was required.  The extensor mechanism was then reapproximated  using #1 Vicryl and #1 Stratafix sutures with the knee   in flexion.  The    remaining wound was closed with 2-0 Vicryl and running 4-0 Monocryl.   The knee was cleaned, dried, dressed sterilely using Dermabond and   Aquacel dressing.  The patient was then   brought to recovery room in stable condition, tolerating the procedure   well.   Please note that Physician Assistant, Dennie Bible, PA-C was present for the entirety of the case, and was utilized for pre-operative positioning, peri-operative retractor management, general facilitation of the procedure and for primary wound closure at the end of the case.              Madlyn Frankel Charlann Boxer, M.D.    02/15/2019 11:37 AM

## 2019-02-15 NOTE — Interval H&P Note (Signed)
History and Physical Interval Note:  02/15/2019 8:51 AM  Hector [name]  has presented today for surgery, with the diagnosis of Right knee osteoarthritis.  The various methods of treatment have been discussed with the patient and family. After consideration of risks, benefits and other options for treatment, the patient has consented to  Procedure(s) with comments: TOTAL KNEE ARTHROPLASTY (Right) - 70 mins as a surgical intervention.  The patient's history has been reviewed, patient examined, no change in status, stable for surgery.  I have reviewed the patient's chart and labs.  Questions were answered to the patient's satisfaction.     [name]

## 2019-02-15 NOTE — Anesthesia Procedure Notes (Signed)
Spinal  Patient location during procedure: OR Start time: 02/15/2019 10:00 AM End time: 02/15/2019 10:10 AM Staffing Anesthesiologist: Elmer Picker, MD Performed: anesthesiologist  Preanesthetic Checklist Completed: patient identified, surgical consent, pre-op evaluation, timeout performed, IV checked, risks and benefits discussed and monitors and equipment checked Spinal Block Patient position: sitting Prep: site prepped and draped and DuraPrep Patient monitoring: cardiac monitor, continuous pulse ox and blood pressure Approach: midline Location: L3-4 Injection technique: single-shot Needle Needle type: Quincke  Needle gauge: 22 G Needle length: 9 cm Assessment Sensory level: T6 Additional Notes Functioning IV was confirmed and monitors were applied. Sterile prep and drape, including hand hygiene and sterile gloves were used. The patient was positioned and the spine was prepped. The skin was anesthetized with lidocaine.  Free flow of clear CSF was obtained prior to injecting local anesthetic into the CSF.  The spinal needle aspirated freely following injection.  The needle was carefully withdrawn.  The patient tolerated the procedure well.

## 2019-02-15 NOTE — Transfer of Care (Signed)
Immediate Anesthesia Transfer of Care Note  Patient: DEMEKO TRABUE  Procedure(s) Performed: TOTAL KNEE ARTHROPLASTY (Right )  Patient Location: PACU  Anesthesia Type:Spinal  Level of Consciousness: awake, alert  and oriented  Airway & Oxygen Therapy: Patient Spontanous Breathing and Patient connected to face mask oxygen  Post-op Assessment: Report given to RN and Post -op Vital signs reviewed and stable  Post vital signs: Reviewed and stable  Last Vitals:  Vitals Value Taken Time  BP    Temp    Pulse 58 02/15/2019 12:09 PM  Resp 18 02/15/2019 12:09 PM  SpO2 100 % 02/15/2019 12:09 PM  Vitals shown include unvalidated device data.  Last Pain:  Vitals:   02/15/19 0756  TempSrc: Oral  PainSc:       Patients Stated Pain Goal: 4 (02/15/19 0755)  Complications: No apparent anesthesia complications

## 2019-02-15 NOTE — Progress Notes (Signed)
Assisted Dr. Willette Alma with Right Adductor canal  block. Side rails up, monitors on throughout procedure. See vital signs in flow sheet. Tolerated Procedure well.

## 2019-02-16 ENCOUNTER — Encounter (HOSPITAL_COMMUNITY): Payer: Self-pay | Admitting: Orthopedic Surgery

## 2019-02-16 DIAGNOSIS — K5730 Diverticulosis of large intestine without perforation or abscess without bleeding: Secondary | ICD-10-CM | POA: Diagnosis not present

## 2019-02-16 DIAGNOSIS — E785 Hyperlipidemia, unspecified: Secondary | ICD-10-CM | POA: Diagnosis not present

## 2019-02-16 DIAGNOSIS — I10 Essential (primary) hypertension: Secondary | ICD-10-CM | POA: Diagnosis not present

## 2019-02-16 DIAGNOSIS — Z6833 Body mass index (BMI) 33.0-33.9, adult: Secondary | ICD-10-CM | POA: Diagnosis not present

## 2019-02-16 DIAGNOSIS — E669 Obesity, unspecified: Secondary | ICD-10-CM | POA: Diagnosis not present

## 2019-02-16 DIAGNOSIS — Z96642 Presence of left artificial hip joint: Secondary | ICD-10-CM | POA: Diagnosis not present

## 2019-02-16 DIAGNOSIS — Z9049 Acquired absence of other specified parts of digestive tract: Secondary | ICD-10-CM | POA: Diagnosis not present

## 2019-02-16 DIAGNOSIS — R7301 Impaired fasting glucose: Secondary | ICD-10-CM | POA: Diagnosis not present

## 2019-02-16 DIAGNOSIS — M1711 Unilateral primary osteoarthritis, right knee: Secondary | ICD-10-CM | POA: Diagnosis not present

## 2019-02-16 LAB — CBC
HCT: 36.7 % — ABNORMAL LOW (ref 39.0–52.0)
Hemoglobin: 11.7 g/dL — ABNORMAL LOW (ref 13.0–17.0)
MCH: 30.5 pg (ref 26.0–34.0)
MCHC: 31.9 g/dL (ref 30.0–36.0)
MCV: 95.6 fL (ref 80.0–100.0)
Platelets: 274 10*3/uL (ref 150–400)
RBC: 3.84 MIL/uL — ABNORMAL LOW (ref 4.22–5.81)
RDW: 13.7 % (ref 11.5–15.5)
WBC: 13.9 10*3/uL — ABNORMAL HIGH (ref 4.0–10.5)
nRBC: 0.1 % (ref 0.0–0.2)

## 2019-02-16 LAB — BASIC METABOLIC PANEL
Anion gap: 11 (ref 5–15)
BUN: 28 mg/dL — ABNORMAL HIGH (ref 8–23)
CO2: 20 mmol/L — ABNORMAL LOW (ref 22–32)
Calcium: 8.8 mg/dL — ABNORMAL LOW (ref 8.9–10.3)
Chloride: 103 mmol/L (ref 98–111)
Creatinine, Ser: 1.25 mg/dL — ABNORMAL HIGH (ref 0.61–1.24)
GFR calc Af Amer: >60 mL/min (ref >60)
GFR calc non Af Amer: 56 mL/min — ABNORMAL LOW (ref >60)
Glucose, Bld: 149 mg/dL — ABNORMAL HIGH (ref 70–99)
Potassium: 4.1 mmol/L (ref 3.5–5.1)
Sodium: 134 mmol/L — ABNORMAL LOW (ref 135–145)

## 2019-02-16 MED ORDER — HYDROCODONE-ACETAMINOPHEN 7.5-325 MG PO TABS: 1.0000 | ORAL_TABLET | ORAL | 0 refills | Status: DC | PRN

## 2019-02-16 MED ORDER — ASPIRIN 81 MG PO CHEW: 81.0000 mg | CHEWABLE_TABLET | Freq: Two times a day (BID) | ORAL | 0 refills | Status: AC

## 2019-02-16 MED ORDER — METHOCARBAMOL 500 MG PO TABS: 500.0000 mg | ORAL_TABLET | Freq: Four times a day (QID) | ORAL | 0 refills | Status: DC | PRN

## 2019-02-16 NOTE — Progress Notes (Signed)
Physical Therapy Treatment Patient Details Name: Hector [name] MRN: [id number] DOB: 1941/03/08 Today's Date: 02/16/2019    History of Present Illness 78 yo male s/p R TKR on 02/15/19. PMH includes L hip surgery with conversion to L THA 2015, OA, HLD, HTN, obesity, diverticulosis, sensory neuropathy.    PT Comments    POD # 1 am session Pt OOB in recliner.  Assisted with amb in ahllway Then returned to room to perform some TE's following HEP handout.  Instructed on proper tech, freq as well as use of ICE.     Follow Up Recommendations  Follow surgeon's recommendation for DC plan and follow-up therapies;Supervision for mobility/OOB     Equipment Recommendations  None recommended by PT    Recommendations for Other Services       Precautions / Restrictions Precautions Precautions: Fall Precaution Comments: Reviewed no pillow under knee Restrictions Weight Bearing Restrictions: No    Mobility  Bed Mobility               General bed mobility comments: OOB in recliner   Transfers Overall transfer level: Needs assistance Equipment used: Rolling walker (2 wheeled) Transfers: Sit to/from Stand Sit to Stand: Min guard;Supervision         General transfer comment: Min guard for safety, verbal cuing for hand placement during rise from bed, pt steady with use of RW upon standing.   Ambulation/Gait Ambulation/Gait assistance: Supervision;Min guard Gait Distance (Feet): 75 Feet Assistive device: Rolling walker (2 wheeled) Gait Pattern/deviations: Step-to pattern;Decreased step length - right;Decreased step length - left;Trunk flexed Gait velocity: decr    General Gait Details: Min guard for safety, verbal cuing for upright posture, sequencing, placement in RW.    Stairs             Wheelchair Mobility    Modified Rankin (Stroke Patients Only)       Balance                                            Cognition Arousal/Alertness:  Awake/alert Behavior During Therapy: WFL for tasks assessed/performed Overall Cognitive Status: Within Functional Limits for tasks assessed                                        Exercises   Total Knee Replacement TE's 10 reps B LE ankle pumps 10 reps towel squeezes 10 reps knee presses 10 reps heel slides  10 reps SAQ's 10 reps SLR's 10 reps ABD Followed by ICE     General Comments        Pertinent Vitals/Pain Pain Assessment: 0-10 Pain Score: 5  Pain Location: R knee Pain Descriptors / Indicators: Tender;Tightness;Sore Pain Intervention(s): Monitored during session;Repositioned;Ice applied    Home Living                      Prior Function            PT Goals (current goals can now be found in the care plan section) Progress towards PT goals: Progressing toward goals    Frequency    7X/week      PT Plan Current plan remains appropriate    Co-evaluation              AM-PAC PT "6  Clicks" Mobility   Outcome Measure  Help needed turning from your back to your side while in a flat bed without using bedrails?: A Little Help needed moving from lying on your back to sitting on the side of a flat bed without using bedrails?: A Little Help needed moving to and from a bed to a chair (including a wheelchair)?: A Little Help needed standing up from a chair using your arms (e.g., wheelchair or bedside chair)?: A Little Help needed to walk in hospital room?: A Little Help needed climbing 3-5 steps with a railing? : A Little 6 Click Score: 18    End of Session Equipment Utilized During Treatment: Gait belt Activity Tolerance: Patient tolerated treatment well Patient left: in chair;with chair alarm set;with call bell/phone within reach;with SCD's reapplied Nurse Communication: Mobility status PT Visit Diagnosis: Other abnormalities of gait and mobility (R26.89);Difficulty in walking, not elsewhere classified (R26.2)     Time:  4536-4680 PT Time Calculation (min) (ACUTE ONLY): 28 min  Charges:  $Gait Training: 8-22 mins $Therapeutic Exercise: 8-22 mins                     Felecia Shelling  PTA Acute  Rehabilitation Services Pager      [phone] Office      [phone]

## 2019-02-16 NOTE — Progress Notes (Signed)
Physical Therapy Treatment Patient Details Name: Hector [name] MRN: [id number] DOB: 1941/07/27 Today's Date: 02/16/2019    History of Present Illness 78 yo male s/p R TKR on 02/15/19. PMH includes L hip surgery with conversion to L THA 2015, OA, HLD, HTN, obesity, diverticulosis, sensory neuropathy.    PT Comments    POD # 1 pm session Assisted with amb in hallway then Then returned to room to perform some TE's following HEP handout.  Instructed on proper tech, freq as well as use of ICE.  Assisted with getting pt dressed and transferred to wc for D/C Addressed all mobility questions, discussed appropriate activity, educated on use of ICE.  Pt ready for D/C to home.   Follow Up Recommendations  Follow surgeon's recommendation for DC plan and follow-up therapies;Supervision for mobility/OOB     Equipment Recommendations  None recommended by PT    Recommendations for Other Services       Precautions / Restrictions Precautions Precautions: Fall Precaution Comments: Reviewed no pillow under knee Restrictions Weight Bearing Restrictions: No    Mobility  Bed Mobility               General bed mobility comments: OOB in recliner   Transfers Overall transfer level: Needs assistance Equipment used: Rolling walker (2 wheeled) Transfers: Sit to/from Stand Sit to Stand: Min guard;Supervision         General transfer comment: Min guard for safety, verbal cuing for hand placement during rise from bed, pt steady with use of RW upon standing.   Ambulation/Gait Ambulation/Gait assistance: Supervision;Min guard Gait Distance (Feet): 75 Feet Assistive device: Rolling walker (2 wheeled) Gait Pattern/deviations: Step-to pattern;Decreased step length - right;Decreased step length - left;Trunk flexed Gait velocity: decr    General Gait Details: Min guard for safety, verbal cuing for upright posture, sequencing, placement in RW.    Stairs             Wheelchair  Mobility    Modified Rankin (Stroke Patients Only)       Balance                                            Cognition Arousal/Alertness: Awake/alert Behavior During Therapy: WFL for tasks assessed/performed Overall Cognitive Status: Within Functional Limits for tasks assessed                                        Exercises      General Comments        Pertinent Vitals/Pain Pain Assessment: 0-10 Pain Score: 5  Pain Location: R knee Pain Descriptors / Indicators: Tender;Tightness;Sore Pain Intervention(s): Monitored during session;Repositioned;Ice applied    Home Living                      Prior Function            PT Goals (current goals can now be found in the care plan section) Progress towards PT goals: Progressing toward goals    Frequency    7X/week      PT Plan Current plan remains appropriate    Co-evaluation              AM-PAC PT "6 Clicks" Mobility   Outcome Measure  Help needed turning from  your back to your side while in a flat bed without using bedrails?: A Little Help needed moving from lying on your back to sitting on the side of a flat bed without using bedrails?: A Little Help needed moving to and from a bed to a chair (including a wheelchair)?: A Little Help needed standing up from a chair using your arms (e.g., wheelchair or bedside chair)?: A Little Help needed to walk in hospital room?: A Little Help needed climbing 3-5 steps with a railing? : A Little 6 Click Score: 18    End of Session Equipment Utilized During Treatment: Gait belt Activity Tolerance: Patient tolerated treatment well Patient left: in chair;with chair alarm set;with call bell/phone within reach;with SCD's reapplied Nurse Communication: Mobility status PT Visit Diagnosis: Other abnormalities of gait and mobility (R26.89);Difficulty in walking, not elsewhere classified (R26.2)     Time: [id number] PT Time  Calculation (min) (ACUTE ONLY): 25 min  Charges:  $Gait Training: 8-22 mins $Therapeutic Exercise: 8-22 mins                     Felecia ShellingLori Abad Manard  PTA Acute  Rehabilitation Services Pager      [phone] Office      [phone]

## 2019-02-16 NOTE — Care Management Obs Status (Signed)
MEDICARE OBSERVATION STATUS NOTIFICATION   Patient Details  Name: SAMIIR LOEHRER MRN: [id number] Date of Birth: [birth year]   Medicare Observation Status Notification Given:  Yes    Armanda Heritage, RN 02/16/2019, 12:02 PM

## 2019-02-16 NOTE — Progress Notes (Signed)
     Subjective: 1 Day Post-Op Procedure(s) (LRB): TOTAL KNEE ARTHROPLASTY (Right)   Patient reports pain as mild, pain controlled.  No events throughout the night.  Feels that he is doing well and ready to progress to get his life back.  Plan to work with PT today.  Patient ready to be discharged home, if he does well with PT.     Patient's anticipated LOS is less than 2 midnights, meeting these requirements: - Lives within 1 hour of care - Has a competent adult at home to recover with post-op recover - NO history of  - Chronic pain requiring opiods  - Diabetes  - Coronary Artery Disease  - Heart failure  - Heart attack  - Stroke  - DVT/VTE  - Cardiac arrhythmia  - Respiratory Failure/COPD  - Anemia  - Advanced Liver disease    Objective:   VITALS:   Vitals:   02/16/19 0111 02/16/19 0458  BP: (!) 160/75 (!) 146/73  Pulse: 69 (!) 55  Resp: 18 18  Temp: (!) 97.5 F (36.4 C) 97.7 F (36.5 C)  SpO2: 97% 97%    Dorsiflexion/Plantar flexion intact Incision: dressing C/D/I No cellulitis present Compartment soft  LABS Recent Labs    02/16/19 0341  HGB 11.7*  HCT 36.7*  WBC 13.9*  PLT 274    Recent Labs    02/16/19 0341  NA 134*  K 4.1  BUN 28*  CREATININE 1.25*  GLUCOSE 149*     Assessment/Plan: 1 Day Post-Op Procedure(s) (LRB): TOTAL KNEE ARTHROPLASTY (Right) D/c'ed Celebrex due to slight post-op bump in BUN/Creat Foley cath d/c'ed Advance diet Up with therapy D/C IV fluids Discharge home Follow up in 2 weeks at [facility]). Follow up with OLIN,Devron Cohick D in 2 weeks.  Contact information:  [facility]) [address] [place] [address] [phone]       Anastasio Auerbach. Corrie Reder   PAC  02/16/2019, 9:08 AM

## 2019-02-16 NOTE — TOC Progression Note (Signed)
Transition of Care [facility]) - Progression Note    Patient Details  Name: Hector [name] MRN: [id number] Date of Birth: Dec 11, 1940  Transition of Care [facility]) CM/SW Contact  Armanda Heritage, RN Phone Number: 02/16/2019, 10:11 AM  Clinical Narrative: CM spoke with pt who reports he is set-up for OPPT. Reports has RW and 3-in-1.             Expected Discharge Plan and Services           Expected Discharge Date: 02/16/19                                     Social Determinants of Health (SDOH) Interventions    Readmission Risk Interventions No flowsheet data found.

## 2019-02-16 NOTE — Progress Notes (Signed)
Gave patient's his discharge instructions. Patient had no questions. NT will wheel patient out once family comes in

## 2019-02-17 DIAGNOSIS — R262 Difficulty in walking, not elsewhere classified: Secondary | ICD-10-CM | POA: Diagnosis not present

## 2019-02-17 DIAGNOSIS — M25561 Pain in right knee: Secondary | ICD-10-CM | POA: Diagnosis not present

## 2019-02-17 DIAGNOSIS — Z96651 Presence of right artificial knee joint: Secondary | ICD-10-CM | POA: Diagnosis not present

## 2019-02-17 DIAGNOSIS — M25661 Stiffness of right knee, not elsewhere classified: Secondary | ICD-10-CM | POA: Diagnosis not present

## 2019-02-17 DIAGNOSIS — M25461 Effusion, right knee: Secondary | ICD-10-CM | POA: Diagnosis not present

## 2019-02-17 NOTE — Discharge Summary (Signed)
Physician Discharge Summary  Patient ID: Hector [name] MRN: [id number] DOB/AGE: November 05, 1940 78 y.o.  Admit date: 02/15/2019 Discharge date: 02/16/2019   Procedures:  Procedure(s) (LRB): TOTAL KNEE ARTHROPLASTY (Right)  Attending Physician:  Dr. Durene Romans   Admission Diagnoses:   Right knee primary OA / pain  Discharge Diagnoses:  Principal Problem:   S/P right TKA  Past Medical History:  Diagnosis Date   Arthritis    PAIN AND OA LEFT HIP; ARTHRITIS BOTH KNEES   Chronic hip pain    LEFT HIP   ED (erectile dysfunction)    Hyperlipidemia    Hypertension    at age 75   IFG (impaired fasting glucose)    Noncompliance    Obesity     HPI:    Hector [name], 78 y.o. male, has a history of pain and functional disability in the right knee due to arthritis and has failed non-surgical conservative treatments for greater than 12 weeks to include NSAID's and/or analgesics, corticosteriod injections, viscosupplementation injections, use of assistive devices and activity modification.  Onset of symptoms was gradual, starting >10 years ago with gradually worsening course since that time. The patient noted prior procedures on the knee to include  arthroscopy and menisectomy on the right knee(s).  Patient currently rates pain in the right knee(s) at 8 out of 10 with activity. Patient has night pain, worsening of pain with activity and weight bearing, pain that interferes with activities of daily living, pain with passive range of motion, crepitus and joint swelling.  Patient has evidence of periarticular osteophytes and joint space narrowing by imaging studies. There is no active infection.  Risks, benefits and expectations were discussed with the patient.  Risks including but not limited to the risk of anesthesia, blood clots, nerve damage, blood vessel damage, failure of the prosthesis, infection and up to and including death.  Patient understand the risks, benefits and expectations  and wishes to proceed with surgery.   PCP: Hector Albert, MD   Discharged Condition: good  Hospital Course:  Patient underwent the above stated procedure on 02/15/2019. Patient tolerated the procedure well and brought to the recovery room in good condition and subsequently to the floor.  POD #1 BP: 146/73 ; Pulse: 55 ; Temp: 97.7 F (36.5 C) ; Resp: 18 Patient reports pain as mild, pain controlled.  No events throughout the night.  Feels that he is doing well and ready to progress to get his life back.  Plan to work with PT today.  Patient ready to be discharged home. Dorsiflexion/plantar flexion intact, incision: dressing C/D/I, no cellulitis present and compartment soft.   LABS  Basename    HGB     11.7  HCT     36.7    Discharge Exam: General appearance: alert, cooperative and no distress Extremities: Homans sign is negative, no sign of DVT, no edema, redness or tenderness in the calves or thighs and no ulcers, gangrene or trophic changes  Disposition:  Home with follow up in 2 weeks   Follow-up Information    Durene Romans, MD. Schedule an appointment as soon as possible for a visit in 2 weeks.   Specialty:  Orthopedic Surgery Contact information: [address] [place] [address] [phone]           Discharge Instructions    Call MD / Call 911   Complete by:  As directed    If you experience chest pain or shortness of breath,  CALL 911 and be transported to the hospital emergency room.  If you develope a fever above 101 F, pus (white drainage) or increased drainage or redness at the wound, or calf pain, call your surgeon's office.   Change dressing   Complete by:  As directed    Maintain surgical dressing until follow up in the clinic. If the edges start to pull up, may reinforce with tape. If the dressing is no longer working, may remove and cover with gauze and tape, but must keep the area dry and clean.  Call with any questions or concerns.     Constipation Prevention   Complete by:  As directed    Drink plenty of fluids.  Prune juice may be helpful.  You may use a stool softener, such as Colace (over the counter) 100 mg twice a day.  Use MiraLax (over the counter) for constipation as needed.   Diet - low sodium heart healthy   Complete by:  As directed    Discharge instructions   Complete by:  As directed    Maintain surgical dressing until follow up in the clinic. If the edges start to pull up, may reinforce with tape. If the dressing is no longer working, may remove and cover with gauze and tape, but must keep the area dry and clean.  [facility]. Call with any questions or concerns.   Increase activity slowly as tolerated   Complete by:  As directed    Weight bearing as tolerated with assist device (walker, cane, etc) as directed, use it as long as suggested by your surgeon or therapist, typically at least 4-6 weeks.   TED hose   Complete by:  As directed    Use stockings (TED hose) for 2 weeks on both leg(s).  You may remove them at night for sleeping.      Allergies as of 02/16/2019      Reactions   Hctz [hydrochlorothiazide]    Erectile Dysfunction   Lipitor [atorvastatin]    Muscle and joint pain      Medication List    TAKE these medications   amLODipine 10 MG tablet Commonly known as:  NORVASC TAKE 1 TABLET (10 MG TOTAL) BY MOUTH DAILY.   aspirin 81 MG chewable tablet Commonly known as:  Aspirin Childrens Chew 1 tablet (81 mg total) by mouth 2 (two) times daily for 30 days. Take for 4 weeks, then resume regular dose.   benazepril 40 MG tablet Commonly known as:  LOTENSIN TAKE 1 TABLET TWICE DAILY   docusate sodium 100 MG capsule Commonly known as:  Colace Take 1 capsule (100 mg total) by mouth 2 (two) times daily.   ferrous sulfate 325 (65 FE) MG tablet Commonly known as:  FerrouSul Take 1 tablet (325 mg total) by mouth 3 (three) times daily with meals.    gemfibrozil 600 MG tablet Commonly known as:  LOPID TAKE 1 TABLET TWICE DAILY BEFORE MEALS What changed:  See the new instructions.   HYDROcodone-acetaminophen 7.5-325 MG tablet Commonly known as:  Norco Take 1-2 tablets by mouth every 4 (four) hours as needed for moderate pain.   methocarbamol 500 MG tablet Commonly known as:  Robaxin Take 1 tablet (500 mg total) by mouth every 6 (six) hours as needed for muscle spasms.   metoprolol tartrate 50 MG tablet Commonly known as:  LOPRESSOR TAKE 1 TABLET (50 MG TOTAL) BY MOUTH 2 (TWO) TIMES DAILY.   polyethylene glycol  17 g packet Commonly known as:  MIRALAX / GLYCOLAX Take 17 g by mouth 2 (two) times daily.            Discharge Care Instructions  (From admission, onward)         Start     Ordered   02/16/19 0000  Change dressing    Comments:  Maintain surgical dressing until follow up in the clinic. If the edges start to pull up, may reinforce with tape. If the dressing is no longer working, may remove and cover with gauze and tape, but must keep the area dry and clean.  Call with any questions or concerns.   02/16/19 0915           Signed: Anastasio Auerbach. Rishab Stoudt   PA-C  02/17/2019, 10:25 AM

## 2019-02-18 DIAGNOSIS — Z96651 Presence of right artificial knee joint: Secondary | ICD-10-CM | POA: Diagnosis not present

## 2019-02-18 DIAGNOSIS — M25661 Stiffness of right knee, not elsewhere classified: Secondary | ICD-10-CM | POA: Diagnosis not present

## 2019-02-18 DIAGNOSIS — M25561 Pain in right knee: Secondary | ICD-10-CM | POA: Diagnosis not present

## 2019-02-18 DIAGNOSIS — R262 Difficulty in walking, not elsewhere classified: Secondary | ICD-10-CM | POA: Diagnosis not present

## 2019-02-18 DIAGNOSIS — M25461 Effusion, right knee: Secondary | ICD-10-CM | POA: Diagnosis not present

## 2019-02-21 DIAGNOSIS — R262 Difficulty in walking, not elsewhere classified: Secondary | ICD-10-CM | POA: Diagnosis not present

## 2019-02-21 DIAGNOSIS — M25461 Effusion, right knee: Secondary | ICD-10-CM | POA: Diagnosis not present

## 2019-02-21 DIAGNOSIS — M25561 Pain in right knee: Secondary | ICD-10-CM | POA: Diagnosis not present

## 2019-02-21 DIAGNOSIS — Z96651 Presence of right artificial knee joint: Secondary | ICD-10-CM | POA: Diagnosis not present

## 2019-02-21 DIAGNOSIS — M25661 Stiffness of right knee, not elsewhere classified: Secondary | ICD-10-CM | POA: Diagnosis not present

## 2019-02-22 DIAGNOSIS — Z96651 Presence of right artificial knee joint: Secondary | ICD-10-CM | POA: Diagnosis not present

## 2019-02-22 DIAGNOSIS — M25561 Pain in right knee: Secondary | ICD-10-CM | POA: Diagnosis not present

## 2019-02-22 DIAGNOSIS — M25661 Stiffness of right knee, not elsewhere classified: Secondary | ICD-10-CM | POA: Diagnosis not present

## 2019-02-22 DIAGNOSIS — M25461 Effusion, right knee: Secondary | ICD-10-CM | POA: Diagnosis not present

## 2019-02-22 DIAGNOSIS — R262 Difficulty in walking, not elsewhere classified: Secondary | ICD-10-CM | POA: Diagnosis not present

## 2019-02-25 DIAGNOSIS — R262 Difficulty in walking, not elsewhere classified: Secondary | ICD-10-CM | POA: Diagnosis not present

## 2019-02-25 DIAGNOSIS — Z96651 Presence of right artificial knee joint: Secondary | ICD-10-CM | POA: Diagnosis not present

## 2019-02-25 DIAGNOSIS — M25461 Effusion, right knee: Secondary | ICD-10-CM | POA: Diagnosis not present

## 2019-02-25 DIAGNOSIS — M25661 Stiffness of right knee, not elsewhere classified: Secondary | ICD-10-CM | POA: Diagnosis not present

## 2019-02-25 DIAGNOSIS — M25561 Pain in right knee: Secondary | ICD-10-CM | POA: Diagnosis not present

## 2019-02-28 DIAGNOSIS — M25661 Stiffness of right knee, not elsewhere classified: Secondary | ICD-10-CM | POA: Diagnosis not present

## 2019-02-28 DIAGNOSIS — R262 Difficulty in walking, not elsewhere classified: Secondary | ICD-10-CM | POA: Diagnosis not present

## 2019-02-28 DIAGNOSIS — M25561 Pain in right knee: Secondary | ICD-10-CM | POA: Diagnosis not present

## 2019-02-28 DIAGNOSIS — Z96651 Presence of right artificial knee joint: Secondary | ICD-10-CM | POA: Diagnosis not present

## 2019-02-28 DIAGNOSIS — M25461 Effusion, right knee: Secondary | ICD-10-CM | POA: Diagnosis not present

## 2019-03-01 DIAGNOSIS — M25561 Pain in right knee: Secondary | ICD-10-CM | POA: Diagnosis not present

## 2019-03-01 DIAGNOSIS — M25661 Stiffness of right knee, not elsewhere classified: Secondary | ICD-10-CM | POA: Diagnosis not present

## 2019-03-01 DIAGNOSIS — Z96651 Presence of right artificial knee joint: Secondary | ICD-10-CM | POA: Diagnosis not present

## 2019-03-01 DIAGNOSIS — R262 Difficulty in walking, not elsewhere classified: Secondary | ICD-10-CM | POA: Diagnosis not present

## 2019-03-01 DIAGNOSIS — M25461 Effusion, right knee: Secondary | ICD-10-CM | POA: Diagnosis not present

## 2019-03-07 DIAGNOSIS — R262 Difficulty in walking, not elsewhere classified: Secondary | ICD-10-CM | POA: Diagnosis not present

## 2019-03-07 DIAGNOSIS — M25661 Stiffness of right knee, not elsewhere classified: Secondary | ICD-10-CM | POA: Diagnosis not present

## 2019-03-07 DIAGNOSIS — M25461 Effusion, right knee: Secondary | ICD-10-CM | POA: Diagnosis not present

## 2019-03-07 DIAGNOSIS — Z96651 Presence of right artificial knee joint: Secondary | ICD-10-CM | POA: Diagnosis not present

## 2019-03-07 DIAGNOSIS — M25561 Pain in right knee: Secondary | ICD-10-CM | POA: Diagnosis not present

## 2019-03-08 DIAGNOSIS — R262 Difficulty in walking, not elsewhere classified: Secondary | ICD-10-CM | POA: Diagnosis not present

## 2019-03-08 DIAGNOSIS — M25461 Effusion, right knee: Secondary | ICD-10-CM | POA: Diagnosis not present

## 2019-03-08 DIAGNOSIS — M25661 Stiffness of right knee, not elsewhere classified: Secondary | ICD-10-CM | POA: Diagnosis not present

## 2019-03-08 DIAGNOSIS — M25561 Pain in right knee: Secondary | ICD-10-CM | POA: Diagnosis not present

## 2019-03-08 DIAGNOSIS — Z96651 Presence of right artificial knee joint: Secondary | ICD-10-CM | POA: Diagnosis not present

## 2019-03-10 ENCOUNTER — Telehealth: Payer: Self-pay | Admitting: *Deleted

## 2019-03-10 DIAGNOSIS — Z96651 Presence of right artificial knee joint: Secondary | ICD-10-CM | POA: Diagnosis not present

## 2019-03-10 DIAGNOSIS — R262 Difficulty in walking, not elsewhere classified: Secondary | ICD-10-CM | POA: Diagnosis not present

## 2019-03-10 DIAGNOSIS — M25461 Effusion, right knee: Secondary | ICD-10-CM | POA: Diagnosis not present

## 2019-03-10 DIAGNOSIS — M25561 Pain in right knee: Secondary | ICD-10-CM | POA: Diagnosis not present

## 2019-03-10 DIAGNOSIS — M25661 Stiffness of right knee, not elsewhere classified: Secondary | ICD-10-CM | POA: Diagnosis not present

## 2019-03-10 NOTE — Telephone Encounter (Signed)
Sounds good

## 2019-03-10 NOTE — Telephone Encounter (Signed)
Called from parking lot and wanted to know if he could get his bp checked. States it was 191/76 at home and has been in the 200's over 70's the past few times he checked it. Had knee replacement 3 weeks ago and was at pt today and thought he would just come over and get bp checked. States he feels fine. Takes metoprolol 50mg  one bid, amlodipine 10mg  one daily and benazepril 40 mg one daily. I let pt sit 10 mins and checked his bp and it was 148/76. Consult with dr Nicki Reaper and dr Nicki Reaper reviewed his medical record. Advised to eat healthy, continue pt, takes meds daily. Check bp twice a week and record number. Come in for office visit with dr Richardson Landry in 2 weeks and bring his numbers and his machine with him. Pt states he already has appt beginning of July and he will keep that and call us back if he needs to come sooner.

## 2019-03-14 DIAGNOSIS — R262 Difficulty in walking, not elsewhere classified: Secondary | ICD-10-CM | POA: Diagnosis not present

## 2019-03-14 DIAGNOSIS — M25561 Pain in right knee: Secondary | ICD-10-CM | POA: Diagnosis not present

## 2019-03-14 DIAGNOSIS — M25661 Stiffness of right knee, not elsewhere classified: Secondary | ICD-10-CM | POA: Diagnosis not present

## 2019-03-14 DIAGNOSIS — Z96651 Presence of right artificial knee joint: Secondary | ICD-10-CM | POA: Diagnosis not present

## 2019-03-14 DIAGNOSIS — M25461 Effusion, right knee: Secondary | ICD-10-CM | POA: Diagnosis not present

## 2019-03-16 DIAGNOSIS — Z96651 Presence of right artificial knee joint: Secondary | ICD-10-CM | POA: Diagnosis not present

## 2019-03-16 DIAGNOSIS — R262 Difficulty in walking, not elsewhere classified: Secondary | ICD-10-CM | POA: Diagnosis not present

## 2019-03-16 DIAGNOSIS — M25461 Effusion, right knee: Secondary | ICD-10-CM | POA: Diagnosis not present

## 2019-03-16 DIAGNOSIS — M25561 Pain in right knee: Secondary | ICD-10-CM | POA: Diagnosis not present

## 2019-03-16 DIAGNOSIS — M25661 Stiffness of right knee, not elsewhere classified: Secondary | ICD-10-CM | POA: Diagnosis not present

## 2019-03-22 DIAGNOSIS — M25661 Stiffness of right knee, not elsewhere classified: Secondary | ICD-10-CM | POA: Diagnosis not present

## 2019-03-22 DIAGNOSIS — R262 Difficulty in walking, not elsewhere classified: Secondary | ICD-10-CM | POA: Diagnosis not present

## 2019-03-22 DIAGNOSIS — Z96651 Presence of right artificial knee joint: Secondary | ICD-10-CM | POA: Diagnosis not present

## 2019-03-22 DIAGNOSIS — M25561 Pain in right knee: Secondary | ICD-10-CM | POA: Diagnosis not present

## 2019-03-22 DIAGNOSIS — M25461 Effusion, right knee: Secondary | ICD-10-CM | POA: Diagnosis not present

## 2019-03-23 DIAGNOSIS — R262 Difficulty in walking, not elsewhere classified: Secondary | ICD-10-CM | POA: Diagnosis not present

## 2019-03-23 DIAGNOSIS — M25661 Stiffness of right knee, not elsewhere classified: Secondary | ICD-10-CM | POA: Diagnosis not present

## 2019-03-23 DIAGNOSIS — M25561 Pain in right knee: Secondary | ICD-10-CM | POA: Diagnosis not present

## 2019-03-23 DIAGNOSIS — M25461 Effusion, right knee: Secondary | ICD-10-CM | POA: Diagnosis not present

## 2019-03-23 DIAGNOSIS — Z96651 Presence of right artificial knee joint: Secondary | ICD-10-CM | POA: Diagnosis not present

## 2019-03-28 DIAGNOSIS — M25561 Pain in right knee: Secondary | ICD-10-CM | POA: Diagnosis not present

## 2019-03-28 DIAGNOSIS — M25661 Stiffness of right knee, not elsewhere classified: Secondary | ICD-10-CM | POA: Diagnosis not present

## 2019-03-28 DIAGNOSIS — R262 Difficulty in walking, not elsewhere classified: Secondary | ICD-10-CM | POA: Diagnosis not present

## 2019-03-28 DIAGNOSIS — Z96651 Presence of right artificial knee joint: Secondary | ICD-10-CM | POA: Diagnosis not present

## 2019-03-28 DIAGNOSIS — M25461 Effusion, right knee: Secondary | ICD-10-CM | POA: Diagnosis not present

## 2019-03-29 DIAGNOSIS — M25661 Stiffness of right knee, not elsewhere classified: Secondary | ICD-10-CM | POA: Diagnosis not present

## 2019-03-29 DIAGNOSIS — M25561 Pain in right knee: Secondary | ICD-10-CM | POA: Diagnosis not present

## 2019-03-29 DIAGNOSIS — R262 Difficulty in walking, not elsewhere classified: Secondary | ICD-10-CM | POA: Diagnosis not present

## 2019-03-29 DIAGNOSIS — Z96651 Presence of right artificial knee joint: Secondary | ICD-10-CM | POA: Diagnosis not present

## 2019-03-29 DIAGNOSIS — M25461 Effusion, right knee: Secondary | ICD-10-CM | POA: Diagnosis not present

## 2019-03-30 ENCOUNTER — Other Ambulatory Visit: Payer: Self-pay

## 2019-03-30 ENCOUNTER — Ambulatory Visit (INDEPENDENT_AMBULATORY_CARE_PROVIDER_SITE_OTHER): Payer: Medicare HMO | Admitting: Family Medicine

## 2019-03-30 DIAGNOSIS — I10 Essential (primary) hypertension: Secondary | ICD-10-CM | POA: Diagnosis not present

## 2019-03-30 NOTE — Progress Notes (Signed)
   Subjective:  Audio plus visual  Patient ID: Hector [name], male    DOB: 04-14-1941, 78 y.o.   MRN: [id number]  HPI  Patient calls to report drop in blood pressure during therapy. Patient had recent knee replacement and is in physical therapy and god hot and sweat and felt bad and they checked his blood pressure and it had dropped to 106/56. Patient states his BP has been back to his normal since the spell.  Virtual Visit via Video Note  I connected with Hector [name] on 03/30/19 at 11:00 AM EDT by a video enabled telemedicine application and verified that I am speaking with the correct person using two identifiers.  Location: Patient: home Provider: office   I discussed the limitations of evaluation and management by telemedicine and the availability of in person appointments. The patient expressed understanding and agreed to proceed.  History of Present Illness:    Observations/Objective:   Assessment and Plan:   Follow Up Instructions:    I discussed the assessment and treatment plan with the patient. The patient was provided an opportunity to ask questions and all were answered. The patient agreed with the plan and demonstrated an understanding of the instructions.   The patient was advised to call back or seek an in-person evaluation if the symptoms worsen or if the condition fails to improve as anticipated.  I provided 28minutes of non-face-to-face time during this encounter.  Old records reviewed at length  Patient had what sounds like a vasovagal episode.  Was participating in physical therapy.  Developed lightheadedness.  Broke out in sweat.  Blood pressure dropped rather abruptly.  Some dizziness at that time.  No falling out spell  Other challenges blood pressures been elevated in recent months.  Compliant with medication.  Checking on blood pressure by  Patient also known to some detail of his recent orthopedic surgery.  See records  Review of Systems  No headache, no major weight loss or weight gain, no chest pain no back pain abdominal pain no change in bowel habits complete ROS otherwise negative     Objective:   Physical Exam Virtually       Assessment & Plan:  Impression vasovagal episode.  Nature of episode discussed.  Nature of etiology discussed.  Was in the midst of physical therapy maneuver  2.  Hypertension suboptimal discussed normal parameters discussed patient maintain same medicine dose.  Already on several meds and maximum dose really do not want to increase right now if we can help it.  Follow-up in office.  Rationale discussed.  Greater than 50% of this 25 minute face to face visit was spent in counseling and discussion and coordination of care regarding the above diagnosis/diagnosies

## 2019-03-31 DIAGNOSIS — Z96651 Presence of right artificial knee joint: Secondary | ICD-10-CM | POA: Diagnosis not present

## 2019-03-31 DIAGNOSIS — M25461 Effusion, right knee: Secondary | ICD-10-CM | POA: Diagnosis not present

## 2019-03-31 DIAGNOSIS — M25561 Pain in right knee: Secondary | ICD-10-CM | POA: Diagnosis not present

## 2019-03-31 DIAGNOSIS — M25661 Stiffness of right knee, not elsewhere classified: Secondary | ICD-10-CM | POA: Diagnosis not present

## 2019-03-31 DIAGNOSIS — R262 Difficulty in walking, not elsewhere classified: Secondary | ICD-10-CM | POA: Diagnosis not present

## 2019-04-01 DIAGNOSIS — Z96651 Presence of right artificial knee joint: Secondary | ICD-10-CM | POA: Diagnosis not present

## 2019-04-01 DIAGNOSIS — Z471 Aftercare following joint replacement surgery: Secondary | ICD-10-CM | POA: Diagnosis not present

## 2019-04-02 ENCOUNTER — Encounter: Payer: Self-pay | Admitting: Family Medicine

## 2019-04-04 DIAGNOSIS — M25661 Stiffness of right knee, not elsewhere classified: Secondary | ICD-10-CM | POA: Diagnosis not present

## 2019-04-04 DIAGNOSIS — Z96651 Presence of right artificial knee joint: Secondary | ICD-10-CM | POA: Diagnosis not present

## 2019-04-04 DIAGNOSIS — M25561 Pain in right knee: Secondary | ICD-10-CM | POA: Diagnosis not present

## 2019-04-04 DIAGNOSIS — M25461 Effusion, right knee: Secondary | ICD-10-CM | POA: Diagnosis not present

## 2019-04-04 DIAGNOSIS — R262 Difficulty in walking, not elsewhere classified: Secondary | ICD-10-CM | POA: Diagnosis not present

## 2019-04-13 ENCOUNTER — Ambulatory Visit (INDEPENDENT_AMBULATORY_CARE_PROVIDER_SITE_OTHER): Payer: Medicare HMO | Admitting: Family Medicine

## 2019-04-13 ENCOUNTER — Other Ambulatory Visit: Payer: Self-pay

## 2019-04-13 VITALS — BP 162/90 | Temp 97.6°F | Ht 71.0 in | Wt 223.6 lb

## 2019-04-13 DIAGNOSIS — I10 Essential (primary) hypertension: Secondary | ICD-10-CM

## 2019-04-13 MED ORDER — INDAPAMIDE 1.25 MG PO TABS: 1.2500 mg | ORAL_TABLET | ORAL | 1 refills | Status: DC

## 2019-04-13 NOTE — Progress Notes (Signed)
   Subjective:    Patient ID: JULIA KULZER, male    DOB: [birth year], 78 y.o.   MRN: [id number]  Hypertension This is a chronic problem. Treatments tried: norvasc, lotensin, metoprolol. There are no compliance problems (takes meds every day, eats healthy, has lost about 20 lbs since march, walks for exercise).     Blood pressure medicine and blood pressure levels reviewed today with patient. Compliant with blood pressure medicine. States does not miss a dose. No obvious side effects. Blood pressure generally good when checked elsewhere. Watching salt intake.  Patient continues to note that his blood pressure is substantially elevated.  Most systolics in the neighborhood of 160.  Claims complete compliance with medication.   Review of Systems No headache, no major weight loss or weight gain, no chest pain no back pain abdominal pain no change in bowel habits complete ROS otherwise negative     Objective:   Physical Exam   Alert vitals stable, NAD. Blood pressure 160/90 on repeat. HEENT normal. Lungs clear. Heart regular rate and rhythm.      Assessment & Plan:  Impression hypertension.  Suboptimal control.  This despite 3 medicines at full doses.  Will add indapamide.  Has some sensitivity to HCTZ in the past.  Side effects benefits discussed.  Med 7 2 weeks

## 2019-05-10 ENCOUNTER — Other Ambulatory Visit: Payer: Self-pay

## 2019-05-10 ENCOUNTER — Ambulatory Visit (INDEPENDENT_AMBULATORY_CARE_PROVIDER_SITE_OTHER): Payer: Medicare HMO | Admitting: Family Medicine

## 2019-05-10 VITALS — BP 179/72

## 2019-05-10 DIAGNOSIS — I10 Essential (primary) hypertension: Secondary | ICD-10-CM | POA: Diagnosis not present

## 2019-05-10 NOTE — Progress Notes (Signed)
   Subjective:  Audio plus video  Patient ID: Hector [name], male    DOB: [birth year], 78 y.o.   MRN: [id number]  Hypertension This is a recurrent problem. The current episode started more than 1 year ago. Risk factors for coronary artery disease include dyslipidemia. Treatments tried: norvasc, lotensin, lozol, lopressor. There are no compliance problems.    BP this am 179/72 per patient  Numbers mostly   Review of Systems Virtual Visit via Video Note  I connected with Hector [name] on 05/10/19 at 10:00 AM EDT by a video enabled telemedicine application and verified that I am speaking with the correct person using two identifiers.  Location: Patient: home Provider: office   I discussed the limitations of evaluation and management by telemedicine and the availability of in person appointments. The patient expressed understanding and agreed to proceed.  History of Present Illness:    Observations/Objective:   Assessment and Plan:   Follow Up Instructions:    I discussed the assessment and treatment plan with the patient. The patient was provided an opportunity to ask questions and all were answered. The patient agreed with the plan and demonstrated an understanding of the instructions.   The patient was advised to call back or seek an in-person evaluation if the symptoms worsen or if the condition fails to improve as anticipated.  I provided 20 minutes of non-face-to-face time during this encounter.  Patient was unable to get met 7 is requested.  See prior notes.  Compliant with all medication.  Continues to have elevated blood pressures.      Objective:   Physical Exam  Verge virtual     Assessment & Plan:  Impression essential hypertension on maximum dose of 3 completely separate medications.  and moderate dose of a fourth medication.  Discussed.  Check met 7.  If good will increase indapamide to 2.5.  At that point he will be on 4 separate medicines with  unsatisfactory control.  Cardiology referral for assessment for secondary hypertension and optimization of meds

## 2019-05-11 ENCOUNTER — Other Ambulatory Visit: Payer: Self-pay | Admitting: *Deleted

## 2019-05-11 LAB — BASIC METABOLIC PANEL
BUN/Creatinine Ratio: 16 (ref 10–24)
BUN: 16 mg/dL (ref 8–27)
CO2: 24 mmol/L (ref 20–29)
Calcium: 9.5 mg/dL (ref 8.6–10.2)
Chloride: 100 mmol/L (ref 96–106)
Creatinine, Ser: 1.02 mg/dL (ref 0.76–1.27)
GFR calc Af Amer: 82 mL/min/{1.73_m2} (ref >59)
GFR calc non Af Amer: 71 mL/min/{1.73_m2} (ref >59)
Glucose: 103 mg/dL — ABNORMAL HIGH (ref 65–99)
Potassium: 3.6 mmol/L (ref 3.5–5.2)
Sodium: 140 mmol/L (ref 134–144)

## 2019-05-11 MED ORDER — INDAPAMIDE 2.5 MG PO TABS: 2.5000 mg | ORAL_TABLET | ORAL | 0 refills | Status: DC

## 2019-05-12 ENCOUNTER — Encounter: Payer: Self-pay | Admitting: Family Medicine

## 2019-05-14 ENCOUNTER — Encounter: Payer: Self-pay | Admitting: Family Medicine

## 2019-06-07 ENCOUNTER — Ambulatory Visit: Payer: Medicare HMO | Admitting: Family Medicine

## 2019-06-23 ENCOUNTER — Ambulatory Visit: Payer: Medicare HMO | Admitting: Cardiology

## 2019-07-12 ENCOUNTER — Other Ambulatory Visit: Payer: Self-pay

## 2019-07-12 ENCOUNTER — Encounter: Payer: Self-pay | Admitting: Cardiology

## 2019-07-12 ENCOUNTER — Ambulatory Visit: Payer: Medicare HMO | Admitting: Cardiology

## 2019-07-12 VITALS — BP 189/80 | HR 68 | Temp 96.7°F | Ht 71.0 in | Wt 222.0 lb

## 2019-07-12 DIAGNOSIS — I10 Essential (primary) hypertension: Secondary | ICD-10-CM

## 2019-07-12 DIAGNOSIS — Z789 Other specified health status: Secondary | ICD-10-CM | POA: Diagnosis not present

## 2019-07-12 DIAGNOSIS — E782 Mixed hyperlipidemia: Secondary | ICD-10-CM

## 2019-07-12 DIAGNOSIS — Z79899 Other long term (current) drug therapy: Secondary | ICD-10-CM | POA: Diagnosis not present

## 2019-07-12 MED ORDER — SPIRONOLACTONE 25 MG PO TABS: 25.0000 mg | ORAL_TABLET | Freq: Every day | ORAL | 3 refills | Status: DC

## 2019-07-12 MED ORDER — CARVEDILOL 12.5 MG PO TABS: 12.5000 mg | ORAL_TABLET | Freq: Two times a day (BID) | ORAL | 3 refills | Status: DC

## 2019-07-12 NOTE — Patient Instructions (Signed)
Medication Instructions:  STOP Lozol  STOP Metoprolol   START Aldactone 25 mg daily  START Coreg 12.5 mg twice a day  *If you need a refill on your cardiac medications before your next appointment, please call your pharmacy*  Lab Work: BMET in 7-10 days after starting Aldactone  If you have labs (blood work) drawn today and your tests are completely normal, you will receive your results only by: Marland Kitchen MyChart Message (if you have MyChart) OR . A paper copy in the mail If you have any lab test that is abnormal or we need to change your treatment, we will call you to review the results.  Testing/Procedures: Your physician has requested that you have a renal artery duplex. During this test, an ultrasound is used to evaluate blood flow to the kidneys. Allow one hour for this exam. Do not eat after midnight the day before and avoid carbonated beverages. Take your medications as you usually do.    Follow-Up: At [facility], you and your health needs are our priority.  As part of our continuing mission to provide you with exceptional heart care, we have created designated Provider Care Teams.  These Care Teams include your primary Cardiologist (physician) and Advanced Practice Providers (APPs -  Physician Assistants and Nurse Practitioners) who all work together to provide you with the care you need, when you need it.  Your next appointment:   1 month  The format for your next appointment:   In Person  Provider:   Bernerd Pho, PA-C  Other Instructions Keep BP log and bring to next visit

## 2019-07-12 NOTE — Progress Notes (Signed)
Cardiology Office Note  Date: 07/12/2019   ID: Hector [name], DOB [birth year], MRN [id number]  PCP:  Merlyn AlbertLuking, William S, MD  Consulting Cardiologist: Jonelle SidleSamuel G. Viola Placeres, MD Electrophysiologist:  None   Chief Complaint  Patient presents with  . Hypertension    History of Present Illness: Hector [name] is a 78 y.o. male referred for cardiology consultation by Dr. Gerda DissLuking to assist with management of hypertension.  He presents today without specific complaints.  He states that for approximately 18 months or so his blood pressure has not been as well controlled on stable medications.  He lost weight and adjusted his diet without improvement, was started on indapamide about 3 months ago.  He has had elevated blood pressure since age 89.  I reviewed his lab work from August, renal function and potassium do not suggest a secondary adrenal issue.  He has no known history of obstructive vascular disease, has not had the renal arteries imaged.  He does not report exertional chest pain, describes NYHA class II dyspnea with typical activities.  He does have to rest more as he has gotten into his mid 6670s.  No palpitations or syncope.  Denies any obvious snoring, states that he feels well rested when he wakes up in the morning.  I reviewed his ECG from May as detailed below.  Past Medical History:  Diagnosis Date  . Arthritis    Osteoarthritis of hip and knees  . Chronic hip pain    Left hip pain  . ED (erectile dysfunction)   . Essential hypertension    Onset age 78  . Hyperlipidemia   . IFG (impaired fasting glucose)   . Noncompliance   . Obesity     Past Surgical History:  Procedure Laterality Date  . BILATERAL KNEE ARTHROSCOPY    . CHOLECYSTECTOMY  2010  . COLONOSCOPY N/A 03/21/2015   Procedure: COLONOSCOPY;  Surgeon: Corbin Adeobert M Rourk, MD;  [facility];  Service: Endoscopy;  Laterality: N/A;  9:30 AM  . HIP FRACTURE SURGERY  2009   LEFT HIP  . KNEE ARTHROTOMY  BILATERAL 50 + YRS AGO    . REFRACTIVE SURGERY Bilateral    LASIK  . TOTAL HIP ARTHROPLASTY Left 07/24/2014   Procedure: CONVERSION OF PREVIOUS HIP SURGERY LEFT HIP ARTHROPLASTY;  Surgeon: Shelda PalMatthew D Olin, MD;  Location: [facility];  Service: Orthopedics;  Laterality: Left;  . TOTAL KNEE ARTHROPLASTY Right 02/15/2019   Procedure: TOTAL KNEE ARTHROPLASTY;  Surgeon: Durene Romanslin, Matthew, MD;  Location: [facility];  Service: Orthopedics;  Laterality: Right;  70 mins    Current Outpatient Medications  Medication Sig Dispense Refill  . amLODipine (NORVASC) 10 MG tablet TAKE 1 TABLET (10 MG TOTAL) BY MOUTH DAILY. 90 tablet 1  . benazepril (LOTENSIN) 40 MG tablet TAKE 1 TABLET TWICE DAILY 180 tablet 1  . docusate sodium (COLACE) 100 MG capsule Take 1 capsule (100 mg total) by mouth 2 (two) times daily. 10 capsule 0  . ferrous sulfate (FERROUSUL) 325 (65 FE) MG tablet Take 1 tablet (325 mg total) by mouth 3 (three) times daily with meals.  3  . gemfibrozil (LOPID) 600 MG tablet TAKE 1 TABLET TWICE DAILY BEFORE MEALS (Patient taking differently: Take 600 mg by mouth 2 (two) times daily before a meal. TAKE 1 TABLET TWICE DAILY BEFORE MEALS) 180 tablet 1  . HYDROcodone-acetaminophen (NORCO) 7.5-325 MG tablet Take 1-2 tablets by mouth every 4 (four) hours as needed for moderate pain. 60 tablet 0  .  methocarbamol (ROBAXIN) 500 MG tablet Take 1 tablet (500 mg total) by mouth every 6 (six) hours as needed for muscle spasms. 40 tablet 0  . polyethylene glycol (MIRALAX / GLYCOLAX) 17 g packet Take 17 g by mouth 2 (two) times daily. 14 each 0  . carvedilol (COREG) 12.5 MG tablet Take 1 tablet (12.5 mg total) by mouth 2 (two) times daily. 60 tablet 3  . spironolactone (ALDACTONE) 25 MG tablet Take 1 tablet (25 mg total) by mouth daily. 30 tablet 3   No current facility-administered medications for this visit.    Allergies:  Hctz [hydrochlorothiazide] and Lipitor [atorvastatin]   Social History: The patient  reports that he  quit smoking about 57 years ago. His smoking use included pipe. He has never used smokeless tobacco. He reports that he does not drink alcohol or use drugs.   Family History: The patient's family history includes Diabetes in his mother and sister; Heart attack in his father; Hypertension in his father and mother.   ROS:  Please see the history of present illness. Otherwise, complete review of systems is positive for arthritic pain, better after knee surgery.  All other systems are reviewed and negative.   Physical Exam: VS:  BP (!) 189/80   Pulse 68   Temp (!) 96.7 F (35.9 C)   Ht 5\' 11"  (1.803 m)   Wt 222 lb (100.7 kg)   SpO2 96%   BMI 30.96 kg/m , BMI Body mass index is 30.96 kg/m.  Wt Readings from Last 3 Encounters:  07/12/19 222 lb (100.7 kg)  04/13/19 223 lb 9.6 oz (101.4 kg)  02/15/19 240 lb 7 oz (109.1 kg)    General: Elderly male, appears comfortable at rest. HEENT: Conjunctiva and lids normal, wearing a mask. Neck: Supple, no elevated JVP or carotid bruits, no thyromegaly. Lungs: Clear to auscultation, nonlabored breathing at rest. Cardiac: Regular rate and rhythm, no S3 or significant systolic murmur, no pericardial rub. Abdomen: Soft, nontender, no bruits, bowel sounds present, no guarding or rebound. Extremities: No pitting edema, distal pulses 2+. Skin: Warm and dry. Musculoskeletal: No kyphosis. Neuropsychiatric: Alert and oriented x3, affect grossly appropriate.  ECG:  An ECG dated 02/09/2019 was personally reviewed today and demonstrated:  Sinus bradycardia with prolonged PR interval and right bundle branch block, inferior Q waves.  Recent Labwork: 08/02/2018: ALT 34; AST 30 02/16/2019: Hemoglobin 11.7; Platelets 274 05/10/2019: BUN 16; Creatinine, Ser 1.02; Potassium 3.6; Sodium 140     Component Value Date/Time   CHOL 127 08/02/2018 0844   TRIG 145 08/02/2018 0844   HDL 30 (L) 08/02/2018 0844   CHOLHDL 4.2 08/02/2018 0844   CHOLHDL 4.3 10/31/2014 0944    VLDL 35 10/31/2014 0944   LDLCALC 68 08/02/2018 0844    Other Studies Reviewed Today:  No recent cardiac testing for review.  Assessment and Plan:  1.  Uncontrolled hypertension, most likely essential hypertension at baseline.  Lab work from August showed normal renal function and potassium which would argue against an adrenal problem.  He has not had his renal arteries imaged and we will obtain a renal artery duplex to exclude renal artery stenosis.  I went over his current medications in detail.  We will continue present doses of Norvasc and benazepril.  I have recommended that we switch from metoprolol to Coreg at 12.5 mg twice daily to hopefully provide somewhat better blood pressure reduction with additional alpha blockade.  We will also take him off indapamide and start Aldactone 25  mg daily.  He will get a follow-up BMET in 7 to 10 days.  He should track blood pressure at home and we will arrange a follow-up office visit.  If his blood pressure does not further improve, can consider assessing for other issues such as obstructive sleep apnea and underlying ischemic heart disease.  2.  Mixed hyperlipidemia with statin intolerance.  He is on gemfibrozil.  Last LDL 68.  Medication Adjustments/Labs and Tests Ordered: Current medicines are reviewed at length with the patient today.  Concerns regarding medicines are outlined above.   Tests Ordered: Orders Placed This Encounter  Procedures  . Basic Metabolic Panel (BMET)  . VAS US RENAL ARTERY DUPLEX    Medication Changes: Meds ordered this encounter  Medications  . carvedilol (COREG) 12.5 MG tablet    Sig: Take 1 tablet (12.5 mg total) by mouth 2 (two) times daily.    Dispense:  60 tablet    Refill:  3  . spironolactone (ALDACTONE) 25 MG tablet    Sig: Take 1 tablet (25 mg total) by mouth daily.    Dispense:  30 tablet    Refill:  3    Disposition:  Follow up 4 weeks in the [place] office with [place].  Signed, Jonelle Sidle, MD, [facility] 07/12/2019 9:35 AM    [facility] [address] [place], [place] [address] Phone: [phone]; Fax: [phone]

## 2019-07-14 ENCOUNTER — Ambulatory Visit (INDEPENDENT_AMBULATORY_CARE_PROVIDER_SITE_OTHER): Payer: Medicare HMO

## 2019-07-14 ENCOUNTER — Other Ambulatory Visit: Payer: Self-pay

## 2019-07-14 DIAGNOSIS — I10 Essential (primary) hypertension: Secondary | ICD-10-CM

## 2019-07-28 ENCOUNTER — Other Ambulatory Visit: Payer: Self-pay | Admitting: Family Medicine

## 2019-07-28 DIAGNOSIS — I10 Essential (primary) hypertension: Secondary | ICD-10-CM | POA: Diagnosis not present

## 2019-07-28 DIAGNOSIS — Z79899 Other long term (current) drug therapy: Secondary | ICD-10-CM | POA: Diagnosis not present

## 2019-07-29 LAB — BASIC METABOLIC PANEL
BUN/Creatinine Ratio: 13 (ref 10–24)
BUN: 15 mg/dL (ref 8–27)
CO2: 21 mmol/L (ref 20–29)
Calcium: 9.9 mg/dL (ref 8.6–10.2)
Chloride: 101 mmol/L (ref 96–106)
Creatinine, Ser: 1.15 mg/dL (ref 0.76–1.27)
GFR calc Af Amer: 71 mL/min/{1.73_m2} (ref >59)
GFR calc non Af Amer: 61 mL/min/{1.73_m2} (ref >59)
Glucose: 149 mg/dL — ABNORMAL HIGH (ref 65–99)
Potassium: 4.2 mmol/L (ref 3.5–5.2)
Sodium: 139 mmol/L (ref 134–144)

## 2019-08-10 ENCOUNTER — Ambulatory Visit: Payer: Medicare HMO | Admitting: Student

## 2019-08-10 ENCOUNTER — Encounter: Payer: Self-pay | Admitting: Student

## 2019-08-10 ENCOUNTER — Other Ambulatory Visit: Payer: Self-pay

## 2019-08-10 VITALS — BP 179/78 | HR 55 | Temp 97.1°F | Ht 71.0 in | Wt 225.0 lb

## 2019-08-10 DIAGNOSIS — E782 Mixed hyperlipidemia: Secondary | ICD-10-CM | POA: Diagnosis not present

## 2019-08-10 DIAGNOSIS — I10 Essential (primary) hypertension: Secondary | ICD-10-CM

## 2019-08-10 MED ORDER — BENAZEPRIL HCL 40 MG PO TABS: 40.0000 mg | ORAL_TABLET | Freq: Two times a day (BID) | ORAL | 3 refills | Status: DC

## 2019-08-10 MED ORDER — CARVEDILOL 12.5 MG PO TABS: 12.5000 mg | ORAL_TABLET | Freq: Two times a day (BID) | ORAL | 3 refills | Status: DC

## 2019-08-10 MED ORDER — SPIRONOLACTONE 25 MG PO TABS: 25.0000 mg | ORAL_TABLET | Freq: Every day | ORAL | 3 refills | Status: DC

## 2019-08-10 NOTE — Patient Instructions (Signed)
Medication Instructions:  Restart Benazepril 40mg  twice daily. Continue other medications as instructed.   Labwork: None  Testing/Procedures: None  Follow-Up: Call with BP readings in 2-3 weeks. Follow-up with Bernerd Pho or Dr. Domenic Polite in 2 months.   Any Other Special Instructions Will Be Listed Below (If Applicable).    If you need a refill on your cardiac medications before your next appointment, please call your pharmacy.

## 2019-08-10 NOTE — Progress Notes (Signed)
Cardiology Office Note    Date:  08/10/2019   ID:  [name], [name] 1941-03-14, MRN [id number]  PCP:  Hector Kirschner, MD  Cardiologist: Hector Lesches, MD    Chief Complaint  Patient presents with  . Follow-up    1 month visit    History of Present Illness:    Hector [name] is a 78 y.o. male with past medical history of HTN and HLD who presents to the office today for 1 month follow-up.  He was last examined by Hector [name] on 07/12/2019 as a new patient referral for management of hypertension. He denied any exertional chest pain but did report having some dyspnea with his routine activities. No reports of snoring or daytime somnolence. BP was elevated to 189/80 during his visit. He was continued on Amlodipine 10mg  daily and Benazepril 40mg  BID with Lopressor being switched to Coreg 12.5mg  BID and Indapamide being transitioned to Aldactone 25mg  daily. Was recommended he have a repeat BMET within two weeks along with renal dopplers. If BP did not improve, would need to consider screening for OSA or ruling-out an ischemic etiology.   Renal Dopplers showed no evidence of renal artery stenosis. Repeat BMET on 07/28/2019 showed stable creatinine of 1.15 with K+ at 4.2. He was continued on his current regimen at that time.  In talking with the patient today, he brings with him a bottle of medications he is currently taking and inside of it is still Indapamide and Benzapril is not included. He says that he received refills on Indapamide from [facility] and continued on this and is not sure how Benzapril was not refilled. BP has remained elevated when checked at home and is at 179/78 during today's visit. He denies any noted side effects to the newly started Coreg or Aldactone.  He denies any recent chest pain, orthopnea, PND, lower extremity edema or palpitations. He does have baseline dyspnea on exertion but denies any acute change in this.  He remains active at baseline and frequently  goes hunting, fishing and does yard work without anginal symptoms.  Past Medical History:  Diagnosis Date  . Arthritis    Osteoarthritis of hip and knees  . Chronic hip pain    Left hip pain  . ED (erectile dysfunction)   . Essential hypertension    Onset age 89  . Hyperlipidemia   . IFG (impaired fasting glucose)   . Noncompliance   . Obesity     Past Surgical History:  Procedure Laterality Date  . BILATERAL KNEE ARTHROSCOPY    . CHOLECYSTECTOMY  2010  . COLONOSCOPY N/A 03/21/2015   Procedure: COLONOSCOPY;  Surgeon: Daneil Dolin, MD;  [facility];  Service: Endoscopy;  Laterality: N/A;  9:30 AM  . HIP FRACTURE SURGERY  2009   LEFT HIP  . KNEE ARTHROTOMY BILATERAL 50 + YRS AGO    . REFRACTIVE SURGERY Bilateral    LASIK  . TOTAL HIP ARTHROPLASTY Left 07/24/2014   Procedure: CONVERSION OF PREVIOUS HIP SURGERY LEFT HIP ARTHROPLASTY;  Surgeon: Mauri Pole, MD;  Location: [facility];  Service: Orthopedics;  Laterality: Left;  . TOTAL KNEE ARTHROPLASTY Right 02/15/2019   Procedure: TOTAL KNEE ARTHROPLASTY;  Surgeon: Paralee Cancel, MD;  Location: [facility];  Service: Orthopedics;  Laterality: Right;  70 mins    Current Medications: Outpatient Medications Prior to Visit  Medication Sig Dispense Refill  . amLODipine (NORVASC) 10 MG tablet TAKE 1 TABLET EVERY DAY 90 tablet 0  .  gemfibrozil (LOPID) 600 MG tablet TAKE 1 TABLET TWICE DAILY BEFORE MEALS 180 tablet 0  . carvedilol (COREG) 12.5 MG tablet Take 1 tablet (12.5 mg total) by mouth 2 (two) times daily. 60 tablet 3  . indapamide (LOZOL) 2.5 MG tablet Take 2.5 mg by mouth daily.    Marland Kitchen spironolactone (ALDACTONE) 25 MG tablet Take 1 tablet (25 mg total) by mouth daily. 30 tablet 3  . benazepril (LOTENSIN) 40 MG tablet TAKE 1 TABLET TWICE DAILY 180 tablet 1  . docusate sodium (COLACE) 100 MG capsule Take 1 capsule (100 mg total) by mouth 2 (two) times daily. 10 capsule 0  . ferrous sulfate (FERROUSUL) 325 (65 FE) MG tablet  Take 1 tablet (325 mg total) by mouth 3 (three) times daily with meals.  3  . HYDROcodone-acetaminophen (NORCO) 7.5-325 MG tablet Take 1-2 tablets by mouth every 4 (four) hours as needed for moderate pain. 60 tablet 0  . methocarbamol (ROBAXIN) 500 MG tablet Take 1 tablet (500 mg total) by mouth every 6 (six) hours as needed for muscle spasms. 40 tablet 0  . polyethylene glycol (MIRALAX / GLYCOLAX) 17 g packet Take 17 g by mouth 2 (two) times daily. 14 each 0   No facility-administered medications prior to visit.      Allergies:   Hctz [hydrochlorothiazide] and Lipitor [atorvastatin]   Social History   Socioeconomic History  . Marital status: Married    Spouse name: Not on file  . Number of children: Not on file  . Years of education: Not on file  . Highest education level: Not on file  Occupational History  . Not on file  Social Needs  . Financial resource strain: Not on file  . Food insecurity    Worry: Not on file    Inability: Not on file  . Transportation needs    Medical: Not on file    Non-medical: Not on file  Tobacco Use  . Smoking status: Former Smoker    Types: Pipe    Quit date: 1963    Years since quitting: 57.9  . Smokeless tobacco: Never Used  . Tobacco comment: quit 40-50 years ago  Substance and Sexual Activity  . Alcohol use: No    Comment: QUIT SMOKING AGE 7  . Drug use: No  . Sexual activity: Not on file  Lifestyle  . Physical activity    Days per week: Not on file    Minutes per session: Not on file  . Stress: Not on file  Relationships  . Social Musician on phone: Not on file    Gets together: Not on file    Attends religious service: Not on file    Active member of club or organization: Not on file    Attends meetings of clubs or organizations: Not on file    Relationship status: Not on file  Other Topics Concern  . Not on file  Social History Narrative  . Not on file     Family History:  The patient's family history  includes Diabetes in his mother and sister; Heart attack in his father; Hypertension in his father and mother.   Review of Systems:   Please see the history of present illness.     General:  No chills, fever, night sweats or weight changes.  Cardiovascular:  No chest pain, dyspnea on exertion, edema, orthopnea, palpitations, paroxysmal nocturnal dyspnea. Dermatological: No rash, lesions/masses Respiratory: No cough, dyspnea Urologic: No hematuria, dysuria Abdominal:  No nausea, vomiting, diarrhea, bright red blood per rectum, melena, or hematemesis Neurologic:  No visual changes, wkns, changes in mental status.  He denies any of the above.   All other systems reviewed and are otherwise negative except as noted above.   Physical Exam:    VS:  BP (!) 179/78   Pulse (!) 55   Temp (!) 97.1 F (36.2 C) (Temporal)   Ht 5\' 11"  (1.803 m)   Wt 225 lb (102.1 kg)   SpO2 96%   BMI 31.38 kg/m    General: Well developed, well nourished,male appearing in no acute distress. Head: Normocephalic, atraumatic, sclera non-icteric, no xanthomas, nares are without discharge.  Neck: No carotid bruits. JVD not elevated.  Lungs: Respirations regular and unlabored, without wheezes or rales.  Heart: Regular rate and rhythm. No S3 or S4.  No murmur, no rubs, or gallops appreciated. Abdomen: Soft, non-tender, non-distended with normoactive bowel sounds. No hepatomegaly. No rebound/guarding. No obvious abdominal masses. Msk:  Strength and tone appear normal for age. No joint deformities or effusions. Extremities: No clubbing or cyanosis. No lower extremity edema.  Distal pedal pulses are 2+ bilaterally. Neuro: Alert and oriented X 3. Moves all extremities spontaneously. No focal deficits noted. Psych:  Responds to questions appropriately with a normal affect. Skin: No rashes or lesions noted  Wt Readings from Last 3 Encounters:  08/10/19 225 lb (102.1 kg)  07/12/19 222 lb (100.7 kg)  04/13/19 223 lb  9.6 oz (101.4 kg)     Studies/Labs Reviewed:   EKG:  EKG is not ordered today.   Recent Labs: 02/16/2019: Hemoglobin 11.7; Platelets 274 07/28/2019: BUN 15; Creatinine, Ser 1.15; Potassium 4.2; Sodium 139   Lipid Panel    Component Value Date/Time   CHOL 127 08/02/2018 0844   TRIG 145 08/02/2018 0844   HDL 30 (L) 08/02/2018 0844   CHOLHDL 4.2 08/02/2018 0844   CHOLHDL 4.3 10/31/2014 0944   VLDL 35 10/31/2014 0944   LDLCALC 68 08/02/2018 0844    Additional studies/ records that were reviewed today include:   Renal Dopplers: 06/2019 Summary: Largest Aortic Diameter: 2.9 cm   Renal:   Right: Normal size right kidney. Normal right Resisitive Index. No        evidence of right renal artery stenosis. RRV flow present.        Normal cortical thickness of right kidney. Left:  Normal size of left kidney. Abnormal left Resisitve Index.        Normal cortical thickness of the left kidney. No evidence of        left renal artery stenosis. LRV flow present. Mesenteric: Normal Celiac artery and Superior Mesenteric artery findings. IVC was patent.  Assessment:    1. Accelerated hypertension   2. Mixed hyperlipidemia      Plan:   In order of problems listed above:  1. Accelerated HTN - Unfortunately, there was some confusion following his recent visit and he stopped Benazepril and continued on Indapamide. Will continue current regimen of Amlodipine 10mg  daily, Coreg 12.5mg  BID, and Spironolactone 25mg  daily with discontinuation of Indapamide given the use of Spironolactone and plan to restart his Benazepril 40mg  BID. Creatinine and K+ stable by recent labs. He will keep a BP log and report back with readings in 2-3 weeks. If still above goal, would further titrate Coreg or Spironolactone.  - renal dopplers recently showed no evidence of renal artery stenosis. If BP remains elevated following the above changes, would then consider screening for  OSA or ischemic testing as previously  recommended.   2. HLD - followed by PCP. He has been intolerant to statin therapy and remains on Gemfibrozil.   Medication Adjustments/Labs and Tests Ordered: Current medicines are reviewed at length with the patient today.  Concerns regarding medicines are outlined above.  Medication changes, Labs and Tests ordered today are listed in the Patient Instructions below. Patient Instructions  Medication Instructions:  Restart Benazepril 40mg  twice daily. Continue other medications as instructed.   Labwork: None  Testing/Procedures: None  Follow-Up: Call with BP readings in 2-3 weeks. Follow-up with or Dr. Randall An in 2 months.   Any Other Special Instructions Will Be Listed Below (If Applicable).  If you need a refill on your cardiac medications before your next appointment, please call your pharmacy.   Signed, Hector Browner, PA-C  08/10/2019 6:45 PM    [facility] [address] [place], [place] Phone: [phone] Fax: [phone]

## 2019-10-12 ENCOUNTER — Other Ambulatory Visit: Payer: Self-pay

## 2019-10-12 MED ORDER — CARVEDILOL 12.5 MG PO TABS: 12.5000 mg | ORAL_TABLET | Freq: Two times a day (BID) | ORAL | 3 refills | Status: DC

## 2019-10-12 MED ORDER — SPIRONOLACTONE 25 MG PO TABS: 25.0000 mg | ORAL_TABLET | Freq: Every day | ORAL | 3 refills | Status: DC

## 2019-10-17 ENCOUNTER — Encounter: Payer: Self-pay | Admitting: Cardiology

## 2019-10-17 ENCOUNTER — Ambulatory Visit: Payer: Medicare HMO | Admitting: Cardiology

## 2019-10-17 VITALS — BP 198/72 | HR 52 | Temp 98.0°F | Ht 71.0 in | Wt 227.0 lb

## 2019-10-17 DIAGNOSIS — E782 Mixed hyperlipidemia: Secondary | ICD-10-CM | POA: Diagnosis not present

## 2019-10-17 DIAGNOSIS — R0602 Shortness of breath: Secondary | ICD-10-CM | POA: Diagnosis not present

## 2019-10-17 DIAGNOSIS — R400 Somnolence: Secondary | ICD-10-CM | POA: Diagnosis not present

## 2019-10-17 DIAGNOSIS — I10 Essential (primary) hypertension: Secondary | ICD-10-CM

## 2019-10-17 MED ORDER — SPIRONOLACTONE 25 MG PO TABS: 25.0000 mg | ORAL_TABLET | Freq: Every day | ORAL | 3 refills | Status: DC

## 2019-10-17 MED ORDER — CLONIDINE HCL 0.1 MG PO TABS: 0.1000 mg | ORAL_TABLET | Freq: Two times a day (BID) | ORAL | 3 refills | Status: DC

## 2019-10-17 NOTE — Progress Notes (Signed)
Cardiology Office Note  Date: 10/17/2019   ID: Hector [name] Aug 14, 1941, MRN [id number]  PCP:  Mikey Kirschner, MD  Cardiologist:  Rozann Lesches, MD Electrophysiologist:  None   Chief Complaint  Patient presents with  . Follow-up blood pressure    History of Present Illness: Hector [name] is a 79 y.o. male last seen in November 2020 by Ms. Strader PA-C.  He presents for a follow-up visit.  At the last visit plan was to continue Norvasc 10 mg daily Coreg 12.5 mg twice daily, Aldactone 25 mg daily, and benazepril 40 mg twice daily.  He had not been on that regimen consistently as yet, states that he is taking these medications as directed now.  He reports home blood pressures with systolics in the 387F to 643P over diastolics in the 29J to 18A.  Had a recent spike in systolic to 416 as well.  He does not report any angina symptoms, mild stable dyspnea on exertion which has not limited his activity.  He has not been evaluated for obstructive sleep apnea.  Renal artery Dopplers were negative for renal artery stenosis.  Past Medical History:  Diagnosis Date  . Arthritis    Osteoarthritis of hip and knees  . Chronic hip pain    Left hip pain  . ED (erectile dysfunction)   . Essential hypertension    Onset age 32  . Hyperlipidemia   . IFG (impaired fasting glucose)   . Noncompliance   . Obesity     Past Surgical History:  Procedure Laterality Date  . BILATERAL KNEE ARTHROSCOPY    . CHOLECYSTECTOMY  2010  . COLONOSCOPY N/A 03/21/2015   Procedure: COLONOSCOPY;  Surgeon: Daneil Dolin, MD;  Location: [facility];  Service: Endoscopy;  Laterality: N/A;  9:30 AM  . HIP FRACTURE SURGERY  2009   LEFT HIP  . KNEE ARTHROTOMY BILATERAL 50 + YRS AGO    . REFRACTIVE SURGERY Bilateral    LASIK  . TOTAL HIP ARTHROPLASTY Left 07/24/2014   Procedure: CONVERSION OF PREVIOUS HIP SURGERY LEFT HIP ARTHROPLASTY;  Surgeon: Mauri Pole, MD;  Location: [facility];  Service:  Orthopedics;  Laterality: Left;  . TOTAL KNEE ARTHROPLASTY Right 02/15/2019   Procedure: TOTAL KNEE ARTHROPLASTY;  Surgeon: Paralee Cancel, MD;  Location: [facility];  Service: Orthopedics;  Laterality: Right;  70 mins    Current Outpatient Medications  Medication Sig Dispense Refill  . amLODipine (NORVASC) 10 MG tablet TAKE 1 TABLET EVERY DAY 90 tablet 0  . benazepril (LOTENSIN) 40 MG tablet Take 1 tablet (40 mg total) by mouth 2 (two) times daily. 180 tablet 3  . carvedilol (COREG) 12.5 MG tablet Take 1 tablet (12.5 mg total) by mouth 2 (two) times daily. 60 tablet 3  . gemfibrozil (LOPID) 600 MG tablet TAKE 1 TABLET TWICE DAILY BEFORE MEALS 180 tablet 0  . spironolactone (ALDACTONE) 25 MG tablet Take 1 tablet (25 mg total) by mouth daily. 90 tablet 3  . cloNIDine (CATAPRES) 0.1 MG tablet Take 1 tablet (0.1 mg total) by mouth 2 (two) times daily. 180 tablet 3   No current facility-administered medications for this visit.   Allergies:  Hctz [hydrochlorothiazide] and Lipitor [atorvastatin]   Social History: The patient  reports that he quit smoking about 58 years ago. His smoking use included pipe. He has never used smokeless tobacco. He reports that he does not drink alcohol or use drugs.   ROS:  Please see the  history of present illness. Otherwise, complete review of systems is positive for none.  All other systems are reviewed and negative.   Physical Exam: VS:  BP (!) 198/72   Pulse (!) 52   Temp 98 F (36.7 C) (Temporal)   Ht 5\' 11"  (1.803 m)   Wt 227 lb (103 kg)   SpO2 98%   BMI 31.66 kg/m , BMI Body mass index is 31.66 kg/m.  Wt Readings from Last 3 Encounters:  10/17/19 227 lb (103 kg)  08/10/19 225 lb (102.1 kg)  07/12/19 222 lb (100.7 kg)    General: Elderly male, appears comfortable at rest. HEENT: Conjunctiva and lids normal, wearing a mask. Neck: Supple, no elevated JVP or carotid bruits, no thyromegaly. Lungs: Clear to auscultation, nonlabored breathing at  rest. Cardiac: Regular rate and rhythm, no S3 or significant systolic murmur. Abdomen: Soft, nontender, bowel sounds present. Extremities: No pitting edema, distal pulses 2+. Skin: Warm and dry. Musculoskeletal: No kyphosis. Neuropsychiatric: Alert and oriented x3, affect grossly appropriate.  ECG:  An ECG dated 02/09/2019 was personally reviewed today and demonstrated:  Sinus bradycardia with prolonged PR interval, right bundle branch block, inferior Q waves.  Recent Labwork: 02/16/2019: Hemoglobin 11.7; Platelets 274 07/28/2019: BUN 15; Creatinine, Ser 1.15; Potassium 4.2; Sodium 139     Component Value Date/Time   CHOL 127 08/02/2018 0844   TRIG 145 08/02/2018 0844   HDL 30 (L) 08/02/2018 0844   CHOLHDL 4.2 08/02/2018 0844   CHOLHDL 4.3 10/31/2014 0944   VLDL 35 10/31/2014 0944   LDLCALC 68 08/02/2018 0844    Other Studies Reviewed Today:  Abdominal ultrasound and renal Dopplers 07/14/2019: Summary: Largest Aortic Diameter: 2.9 cm   Renal:   Right: Normal size right kidney. Normal right Resisitive Index. No        evidence of right renal artery stenosis. RRV flow present.        Normal cortical thickness of right kidney. Left:  Normal size of left kidney. Abnormal left Resisitve Index.        Normal cortical thickness of the left kidney. No evidence of        left renal artery stenosis. LRV flow present. Mesenteric: Normal Celiac artery and Superior Mesenteric artery findings. IVC was patent.  Assessment and Plan:  1.  Uncontrolled hypertension, likely essential hypertension.  No evidence of renal artery stenosis by renal artery Dopplers.  He is on multimodal treatment at this point including Coreg 12.5 mg twice daily, Aldactone 25 mg daily, Norvasc 10 mg daily, and lisinopril 40 mg twice daily.  Follow-up lab work as outlined above.  His heart rate is in the 50s.  We will initiate clonidine 0.1 mg twice daily.  Obtain echocardiogram to assess cardiac structure and  function, exclude cardiomyopathy.  Also refer for sleep medicine evaluation to exclude OSA.  If unable to further improve blood pressure control on multimodal therapy, will refer to our hypertension clinic.  2.  Mixed hyperlipidemia with statin intolerance.  He is on gemfibrozil and continues to follow with Dr. 07/16/2019.  Medication Adjustments/Labs and Tests Ordered: Current medicines are reviewed at length with the patient today.  Concerns regarding medicines are outlined above.   Tests Ordered: Orders Placed This Encounter  Procedures  . Ambulatory referral to Cardiology  . ECHOCARDIOGRAM COMPLETE    Medication Changes: Meds ordered this encounter  Medications  . spironolactone (ALDACTONE) 25 MG tablet    Sig: Take 1 tablet (25 mg total) by mouth daily.  Dispense:  90 tablet    Refill:  3  . cloNIDine (CATAPRES) 0.1 MG tablet    Sig: Take 1 tablet (0.1 mg total) by mouth 2 (two) times daily.    Dispense:  180 tablet    Refill:  3    Disposition:  Follow up 4 to 6 weeks in the [place] office.  Signed, Jonelle Sidle, MD, [facility] 10/17/2019 1:32 PM    [facility] at [facility] [address], [place], [place] [address] Phone: [phone]; Fax: [phone]

## 2019-10-17 NOTE — Patient Instructions (Signed)
Medication Instructions:  START Clonidine 0.1 mg twice a day  *If you need a refill on your cardiac medications before your next appointment, please call your pharmacy*  Lab Work: NONE If you have labs (blood work) drawn today and your tests are completely normal, you will receive your results only by: Marland Kitchen MyChart Message (if you have MyChart) OR . A paper copy in the mail If you have any lab test that is abnormal or we need to change your treatment, we will call you to review the results.  Testing/Procedures: Your physician has requested that you have an echocardiogram. Echocardiography is a painless test that uses sound waves to create images of your heart. It provides your doctor with information about the size and shape of your heart and how well your heart's chambers and valves are working. This procedure takes approximately one hour. There are no restrictions for this procedure.   Follow-Up: At [facility], you and your health needs are our priority.  As part of our continuing mission to provide you with exceptional heart care, we have created designated Provider Care Teams.  These Care Teams include your primary Cardiologist (physician) and Advanced Practice Providers (APPs -  Physician Assistants and Nurse Practitioners) who all work together to provide you with the care you need, when you need it.  Your next appointment:   4 week(s)  The format for your next appointment:   In Person  Provider:   Nona Dell, MD  Other Instructions I have referred you to Dr.Thomas Tresa Endo at the [facility] office for consult for sleep study. They will call you for an apt.      Thank you for choosing [facility] !

## 2019-10-21 ENCOUNTER — Other Ambulatory Visit: Payer: Self-pay

## 2019-10-21 ENCOUNTER — Ambulatory Visit (HOSPITAL_COMMUNITY)
Admission: RE | Admit: 2019-10-21 | Discharge: 2019-10-21 | Disposition: A | Discharge status: Home / Self Care | Payer: Medicare HMO | Source: Ambulatory Visit | Attending: Cardiology | Admitting: Cardiology

## 2019-10-21 DIAGNOSIS — R0602 Shortness of breath: Secondary | ICD-10-CM | POA: Insufficient documentation

## 2019-10-21 NOTE — Progress Notes (Signed)
*  PRELIMINARY RESULTS* Echocardiogram 2D Echocardiogram has been performed.  Stacey Drain 10/21/2019, 10:17 AM

## 2019-10-27 ENCOUNTER — Encounter: Payer: Self-pay | Admitting: Family Medicine

## 2019-10-28 ENCOUNTER — Encounter: Payer: Self-pay | Admitting: Cardiovascular Disease

## 2019-10-28 ENCOUNTER — Ambulatory Visit: Payer: Medicare HMO | Admitting: Cardiovascular Disease

## 2019-10-28 ENCOUNTER — Other Ambulatory Visit: Payer: Self-pay

## 2019-10-28 DIAGNOSIS — R400 Somnolence: Secondary | ICD-10-CM

## 2019-10-28 DIAGNOSIS — I519 Heart disease, unspecified: Secondary | ICD-10-CM | POA: Diagnosis not present

## 2019-10-28 DIAGNOSIS — I5189 Other ill-defined heart diseases: Secondary | ICD-10-CM

## 2019-10-28 DIAGNOSIS — I10 Essential (primary) hypertension: Secondary | ICD-10-CM

## 2019-10-28 DIAGNOSIS — G4733 Obstructive sleep apnea (adult) (pediatric): Secondary | ICD-10-CM

## 2019-10-28 DIAGNOSIS — E782 Mixed hyperlipidemia: Secondary | ICD-10-CM

## 2019-10-28 NOTE — Patient Instructions (Signed)
Medication Instructions:  NO CHANGES *If you need a refill on your cardiac medications before your next appointment, please call your pharmacy*  Testing/Procedures: Your physician has recommended that you have a sleep study. This test records several body functions during sleep, including: brain activity, eye movement, oxygen and carbon dioxide blood levels, heart rate and rhythm, breathing rate and rhythm, the flow of air through your mouth and nose, snoring, body muscle movements, and chest and belly movement. AT HOME   Follow-Up: FOLLOW UP PENDING SLEEP STUDY

## 2019-10-28 NOTE — Progress Notes (Signed)
Cardiology Office Note    Date:  10/30/2019   ID:  Hector [name], Hector [name] 02-17-1941, MRN [id number]  PCP:  Mikey Kirschner, MD  Cardiologist:  Shelva Majestic, MD   Initial evaluation with me referred through the courtesy of Dr. Johnny Bridge  History of Present Illness:  Hector BRADBURN is a 79 y.o. male who has had a history of difficult to control hypertension requiring multiple medical management.  He is referred by Dr. Domenic Polite for evaluation of potential sleep apnea contributing to his adult to control hypertension.  Hector [name] has a longstanding history of hypertension as well as a history of hyperlipidemia.  He has had significantly increased pressures despite being on medical therapy.  His medications now include amlodipine 10 mg, benazepril 40 mg, carvedilol 12.5 mg twice a day, spironolactone 25 g daily, in addition to clonidine 0.1 mg twice a day.  He has undergone evaluation for renal artery stenosis and had normal renal Dopplers.  Renal function has stable with a creatinine of 1.15 and potassium of 4.2 and bmet in November 2020.  He is on gemfibrozil for his mixed hyperlipidemia.  Blood pressure has remained elevated and he now presents for evaluation of potential sleep apnea.  Presently, he admits to morning blood pressure elevation at times in the 160s to 180 range.  He believes his sleep is restorative.  He denies definitive snoring but he states his wife says he"purrs."  He typically urinates only 1 time per night. He is unaware of nocturnal palpitations.   He denies any daytime sleepiness.   Past Medical History:  Diagnosis Date  . Arthritis    Osteoarthritis of hip and knees  . Chronic hip pain    Left hip pain  . ED (erectile dysfunction)   . Essential hypertension    Onset age 88  . Hyperlipidemia   . IFG (impaired fasting glucose)   . Noncompliance   . Obesity     Past Surgical History:  Procedure Laterality Date  . BILATERAL KNEE ARTHROSCOPY    .  CHOLECYSTECTOMY  2010  . COLONOSCOPY N/A 03/21/2015   Procedure: COLONOSCOPY;  Surgeon: Daneil Dolin, MD;  Location: [facility];  Service: Endoscopy;  Laterality: N/A;  9:30 AM  . HIP FRACTURE SURGERY  2009   LEFT HIP  . KNEE ARTHROTOMY BILATERAL 50 + YRS AGO    . REFRACTIVE SURGERY Bilateral    LASIK  . TOTAL HIP ARTHROPLASTY Left 07/24/2014   Procedure: CONVERSION OF PREVIOUS HIP SURGERY LEFT HIP ARTHROPLASTY;  Surgeon: Mauri Pole, MD;  Location: [facility];  Service: Orthopedics;  Laterality: Left;  . TOTAL KNEE ARTHROPLASTY Right 02/15/2019   Procedure: TOTAL KNEE ARTHROPLASTY;  Surgeon: Paralee Cancel, MD;  Location: [facility];  Service: Orthopedics;  Laterality: Right;  70 mins    Current Medications: Outpatient Medications Prior to Visit  Medication Sig Dispense Refill  . amLODipine (NORVASC) 10 MG tablet TAKE 1 TABLET EVERY DAY 90 tablet 0  . amLODipine (NORVASC) 10 MG tablet amlodipine 10 mg tablet    . aspirin (BAYER LOW DOSE) 81 MG chewable tablet Bayer Chewable Low Dose Aspirin 81 mg tablet  CHEW ONE TABLET BY MOUTH TWICE DAILY FOR 30 DAYS, THEN RESUME REGULAR DOSE    . benazepril (LOTENSIN) 40 MG tablet Take 1 tablet (40 mg total) by mouth 2 (two) times daily. 180 tablet 3  . benazepril (LOTENSIN) 40 MG tablet benazepril 40 mg tablet    . carvedilol (COREG)  12.5 MG tablet Take 1 tablet (12.5 mg total) by mouth 2 (two) times daily. 60 tablet 3  . cloNIDine (CATAPRES) 0.1 MG tablet Take 1 tablet (0.1 mg total) by mouth 2 (two) times daily. 180 tablet 3  . gemfibrozil (LOPID) 600 MG tablet TAKE 1 TABLET TWICE DAILY BEFORE MEALS 180 tablet 0  . gemfibrozil (LOPID) 600 MG tablet gemfibrozil 600 mg tablet    . indapamide (LOZOL) 1.25 MG tablet     . spironolactone (ALDACTONE) 25 MG tablet Take 1 tablet (25 mg total) by mouth daily. 90 tablet 3  . metoprolol tartrate (LOPRESSOR) 50 MG tablet metoprolol tartrate 50 mg tablet     No facility-administered medications prior to visit.       Allergies:   Hctz [hydrochlorothiazide] and Lipitor [atorvastatin]   Social History   Socioeconomic History  . Marital status: Married    Spouse name: Not on file  . Number of children: Not on file  . Years of education: Not on file  . Highest education level: Not on file  Occupational History  . Not on file  Tobacco Use  . Smoking status: Former Smoker    Types: Pipe    Quit date: 1963    Years since quitting: 58.1  . Smokeless tobacco: Never Used  . Tobacco comment: quit 40-50 years ago  Substance and Sexual Activity  . Alcohol use: No    Comment: QUIT SMOKING AGE 79  . Drug use: No  . Sexual activity: Not on file  Other Topics Concern  . Not on file  Social History Narrative  . Not on file   Social Determinants of Health   Financial Resource Strain:   . Difficulty of Paying Living Expenses: Not on file  Food Insecurity:   . Worried About Charity fundraiser in the Last Year: Not on file  . Ran Out of Food in the Last Year: Not on file  Transportation Needs:   . Lack of Transportation (Medical): Not on file  . Lack of Transportation (Non-Medical): Not on file  Physical Activity:   . Days of Exercise per Week: Not on file  . Minutes of Exercise per Session: Not on file  Stress:   . Feeling of Stress : Not on file  Social Connections:   . Frequency of Communication with Friends and Family: Not on file  . Frequency of Social Gatherings with Friends and Family: Not on file  . Attends Religious Services: Not on file  . Active Member of Clubs or Organizations: Not on file  . Attends Archivist Meetings: Not on file  . Marital Status: Not on file    Socially he is in his second marriage of 6 years.  His first marriage was 47 years.  He has 2 children ages 34 and 11 and 1 grandchild age 83.  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   His mother is 68 and has diabetes and history of CVA.  Father suffered a cardial infarction at age 62.  He has 1 sister with heart problems and 1 sister with diabetes mellitus and hypertension.  ROS General: Negative; No fevers, chills, or night sweats;  HEENT: Negative; No changes in vision or hearing, sinus congestion, difficulty swallowing Pulmonary: Negative; No cough, wheezing, shortness of breath, hemoptysis  Cardiovascular: See HPI; no chest pain, PND orthopnea, palpitations or history of atrial fibrillation GI: Negative; No nausea, vomiting, diarrhea, or abdominal pain GU: Negative; No dysuria, hematuria, or difficulty voiding Musculoskeletal: Negative; no myalgias, joint pain, or weakness Hematologic/Oncology: Negative; no easy bruising, bleeding Endocrine: Negative; no heat/cold intolerance; no diabetes Neuro: Negative; no changes in balance, headaches Skin: Negative; No rashes or skin lesions Psychiatric: Negative; No behavioral problems, depression Sleep: Negative; No snoring, daytime sleepiness, hypersomnolence, bruxism, restless legs, hypnogognic hallucinations, no cataplexy Other comprehensive 14 point system review is negative.   PHYSICAL EXAM:   VS:  BP 140/76   Pulse (!) 50   Temp 97.7 F (36.5 C)   Ht 5' 11" (1.803 m)   Wt 227 lb (103 kg)   SpO2 97%   BMI 31.66 kg/m     Repeat blood pressure by me was elevated at 150/76  Wt Readings from Last 3 Encounters:  10/28/19 227 lb (103 kg)  10/17/19 227 lb (103 kg)  08/10/19 225 lb (102.1 kg)    General: Alert, oriented, no distress.  Skin: normal turgor, no rashes, warm and dry HEENT: Normocephalic, atraumatic. Pupils equal round and reactive to light; sclera anicteric; extraocular muscles intact;  Nose without nasal septal hypertrophy Mouth/Parynx benign; Mallinpatti scale 3 Neck: No JVD, no carotid bruits; normal carotid  upstroke Lungs: clear to ausculatation and percussion; no wheezing or rales Chest wall: without tenderness to palpitation Heart: PMI not displaced, RRR, s1 s2 normal, 1/6 systolic murmur, no diastolic murmur, no rubs, gallops, thrills, or heaves Abdomen: soft, nontender; no hepatosplenomehaly, BS+; abdominal aorta nontender and not dilated by palpation. Back: no CVA tenderness Pulses 2+ Musculoskeletal: full range of motion, normal strength, no joint deformities Extremities: no clubbing cyanosis or edema, Homan's sign negative  Neurologic: grossly nonfocal; Cranial nerves grossly wnl Psychologic: Normal mood and affect   Studies/Labs Reviewed:   EKG:  EKG is  ordered today.  ECG (independently read by me): Sinus Bradycardia at 50 ; First degree AV block  Recent Labs: BMP Latest Ref Rng & Units 07/28/2019 05/10/2019 02/16/2019  Glucose 65 - 99 mg/dL 149(H) 103(H) 149(H)  BUN 8 - 27 mg/dL 15 16 28(H)  Creatinine 0.76 - 1.27 mg/dL 1.15 1.02 1.25(H)  BUN/Creat Ratio 10 - _0 -  Sodium 134 - 144 mmol/L 139 140 134(L)  Potassium 3.5 - 5.2 mmol/L 4.2 3.6 4.1  Chloride 96 - 106 mmol/L 101 100 103  CO2 20 - 29 mmol/L 21 24 20(L)  Calcium 8.6 - 10.2 mg/dL 9.9 9.5 8.8(L)     Hepatic Function Latest Ref Rng & Units 08/02/2018 11/30/2017 06/01/2017  Total Protein 6.0 - 8.5 g/dL 7.2 7.6 7.2  Albumin 3.5 - 4.8 g/dL 4.7 4.9(H) 4.6  AST 0 - 40 IU/L _1 ALT 0 - 44 IU/L 34 34 25  Alk Phosphatase 39 - 117 IU/L 57 49 62  Total Bilirubin 0.0 - 1.2 mg/dL 0.4 0.5 0.4  Bilirubin, Direct 0.00 - 0.40 mg/dL 0.10 0.15 0.10    CBC Latest Ref Rng & Units 02/16/2019 02/09/2019 09/22/2014  WBC 4.0 - 10.5 K/uL 13.9(H) 9.2 12.5(H)  Hemoglobin 13.0 - 17.0 g/dL 11.7(L) 14.6 14.7  Hematocrit 39.0 - 52.0 % 36.7(L) 44.0 43.8  Platelets 150 - 400 K/uL 274 284 242   Lab Results  Component Value Date   MCV 95.6 02/16/2019   MCV 94.6 02/09/2019   MCV 89.9 09/22/2014   Lab Results  Component Value Date  TSH 1.429 01/24/2013   Lab Results  Component Value Date   HGBA1C 6.1 (A) 12/03/2018     BNP No results found for: BNP  ProBNP No results found for: PROBNP   Lipid Panel     Component Value Date/Time   CHOL 127 08/02/2018 0844   TRIG 145 08/02/2018 0844   HDL 30 (L) 08/02/2018 0844   CHOLHDL 4.2 08/02/2018 0844   CHOLHDL 4.3 10/31/2014 0944   VLDL 35 10/31/2014 0944   LDLCALC 68 08/02/2018 0844   LABVLDL 29 08/02/2018 0844     RADIOLOGY: ECHOCARDIOGRAM COMPLETE  Result Date: 10/21/2019   ECHOCARDIOGRAM REPORT   Patient Name:   Hector [name] Date of Exam: 10/21/2019 Medical Rec #:  [id number]        Height:       71.0 in Accession #:    [id number]       Weight:       227.0 lb Date of Birth:  1940-12-24        BSA:          2.23 m Patient Age:    89 years         BP:           161/72 mmHg Patient Gender: M                HR:           56 bpm. Exam Location:  [facility] Procedure: 2D Echo, Cardiac Doppler and Color Doppler Indications:    R06.02 (ICD-10-CM) - SOB (shortness of breath)  History:        Patient has no prior history of Echocardiogram examinations.                 Risk Factors:Hypertension and Dyslipidemia.  Sonographer:    Alvino Chapel RCS Referring Phys: [place]  1. Left ventricular ejection fraction, by visual estimation, is 65 to 70%. The left ventricle has hyperdynamic function. There is moderately increased left ventricular hypertrophy.  2. Elevated left ventricular end-diastolic pressure.  3. Left ventricular diastolic parameters are consistent with Grade I diastolic dysfunction (impaired relaxation).  4. The left ventricle has no regional wall motion abnormalities.  5. Global right ventricle has normal systolic function.The right ventricular size is normal. No increase in right ventricular wall thickness.  6. Left atrial size was mildly dilated.  7. Right atrial size was normal.  8. The mitral valve is grossly normal. Mild mitral valve  regurgitation.  9. The tricuspid valve is grossly normal. 10. The tricuspid valve is grossly normal. Tricuspid valve regurgitation is trivial. 11. The aortic valve is tricuspid. Aortic valve regurgitation is not visualized. Mild aortic valve sclerosis without stenosis. 12. The pulmonic valve was grossly normal. Pulmonic valve regurgitation is not visualized. 13. The inferior vena cava is dilated in size with >50% respiratory variability, suggesting right atrial pressure of 8 mmHg. FINDINGS  Left Ventricle: Left ventricular ejection fraction, by visual estimation, is 65 to 70%. The left ventricle has hyperdynamic function. The left ventricle has no regional wall motion abnormalities. There is moderately increased left ventricular hypertrophy. Concentric left ventricular hypertrophy. Left ventricular diastolic parameters are consistent with Grade I diastolic dysfunction (impaired relaxation). Elevated left ventricular end-diastolic pressure. Right Ventricle: The right ventricular size is normal. No increase in right ventricular wall thickness. Global RV systolic function is has normal systolic function. Left Atrium: Left atrial size was mildly dilated. Right Atrium: Right atrial size was normal  in size Pericardium: There is no evidence of pericardial effusion. Mitral Valve: The mitral valve is grossly normal. Mild mitral valve regurgitation. Tricuspid Valve: The tricuspid valve is grossly normal. Tricuspid valve regurgitation is trivial. Aortic Valve: The aortic valve is tricuspid. Aortic valve regurgitation is not visualized. Mild aortic valve sclerosis is present, with no evidence of aortic valve stenosis. Mild aortic valve annular calcification. Pulmonic Valve: The pulmonic valve was grossly normal. Pulmonic valve regurgitation is not visualized. Pulmonic regurgitation is not visualized. Aorta: The aortic root is normal in size and structure. Venous: The inferior vena cava is dilated in size with greater than 50%  respiratory variability, suggesting right atrial pressure of 8 mmHg. IAS/Shunts: No atrial level shunt detected by color flow Doppler.  LEFT VENTRICLE PLAX 2D LVIDd:         3.26 cm  Diastology LVIDs:         2.00 cm  LV e' lateral:   8.27 cm/s LV PW:         1.47 cm  LV E/e' lateral: 11.1 LV IVS:        1.49 cm  LV e' medial:    5.77 cm/s LVOT diam:     2.30 cm  LV E/e' medial:  16.0 LV SV:         30 ml LV SV Index:   13.10 LVOT Area:     4.15 cm  RIGHT VENTRICLE RV S prime:     14.10 cm/s TAPSE (M-mode): 2.1 cm LEFT ATRIUM             Index       RIGHT ATRIUM           Index LA diam:        4.30 cm 1.93 cm/m  RA Area:     18.80 cm LA Vol (A2C):   84.4 ml 37.93 ml/m RA Volume:   49.00 ml  22.02 ml/m LA Vol (A4C):   76.5 ml 34.38 ml/m LA Biplane Vol: 86.9 ml 39.05 ml/m  AORTIC VALVE LVOT Vmax:   141.00 cm/s LVOT Vmean:  109.000 cm/s LVOT VTI:    0.361 m  AORTA Ao Root diam: 3.50 cm MITRAL VALVE MV Area (PHT): 2.48 cm             SHUNTS MV PHT:        88.74 msec           Systemic VTI:  0.36 m MV Decel Time: 306 msec             Systemic Diam: 2.30 cm MV E velocity: 92.10 cm/s 103 cm/s MV A velocity: 91.70 cm/s 70.3 cm/s MV E/A ratio:  1.00       1.5  Kate Sable MD Electronically signed by Kate Sable MD Signature Date/Time: 10/21/2019/11:28:36 AM    Final      Additional studies/ records that were reviewed today include:  I reviewed the records of Dr. Domenic Polite and his colleagues.  Laboratory results were reviewed.  Imaging studies are reviewed.   ASSESSMENT:    1. Evaluate for OSA OSA (obstructive sleep apnea)   2. Uncontrolled hypertension   3. Snoring   4. Grade I diastolic dysfunction   5. Mixed hyperlipidemia    PLAN:  Hector [name] is a 79 year old gentleman who has a history of longstanding hypertension that has required a 5 drug regimen.  His blood pressure has been difficult to control.  Renal duplex imaging was negative for renal vascular  etiology.  His blood  pressure today remains mildly elevated despite being on amlodipine 10 mg, benazepril 40 mg, carvedilol 12.5 mg twice a day, spironolactone 25 mg twice a day in addition to clonidine 0.1 mg twice a day.  He is bradycardic with heart rates in the 50s.  He has no history of flushing.  He has been referred for evaluation of potential sleep apnea.  The patient is unaware of any sleep disordered breathing.  He denies snoring, but his wife states he "purrs" while sleeping.  He denies nocturnal palpitations, he believes his sleep is restorative, he denies waking up gasping for breath.  He denies morning headaches.  Her morning blood pressure typically is elevated.  I had a long discussion with him today regarding sleep apnea and its effect on normal sleep architecture as well as its potential adverse cardiovascular effects.  Particularly, I discussed if patients have significant REM related sleep apnea this can play a significant role in difficult to control blood pressure.  I am scheduling him for a home sleep study for initial evaluation of potential sleep apnea but I discussed with him that unfortunately that home study does not allow for assessment of specific REM related sleep disorder since stage of sleep is not identified on the home evaluation.  However, not certain an in lab study would be approved by his insurance company.   His echo Doppler study has shown hypertension LV function with moderate LVH and grade 1 diastolic dysfunction.  If blood pressure continues to be elevated, consider urinary studies to evaluate for possible pheochromocytoma or other entities.   Medication Adjustments/Labs and Tests Ordered: Current medicines are reviewed at length with the patient today.  Concerns regarding medicines are outlined above.  Medication changes, Labs and Tests ordered today are listed in the Patient Instructions below. Patient Instructions  Medication Instructions:  NO CHANGES *If you need a refill on your  cardiac medications before your next appointment, please call your pharmacy*  Testing/Procedures: Your physician has recommended that you have a sleep study. This test records several body functions during sleep, including: brain activity, eye movement, oxygen and carbon dioxide blood levels, heart rate and rhythm, breathing rate and rhythm, the flow of air through your mouth and nose, snoring, body muscle movements, and chest and belly movement. AT HOME   Follow-Up: FOLLOW UP PENDING SLEEP STUDY      Signed, Shelva Majestic, MD  10/30/2019 12:03 PM    [facility] [address] [place], [place], [place]  [address] Phone: [phone]

## 2019-10-30 ENCOUNTER — Encounter: Payer: Self-pay | Admitting: Cardiovascular Disease

## 2019-11-01 NOTE — Addendum Note (Signed)
Addended by: Myna Hidalgo A on: 11/01/2019 04:29 PM   Modules accepted: Orders

## 2019-11-03 ENCOUNTER — Other Ambulatory Visit: Payer: Self-pay

## 2019-11-03 ENCOUNTER — Telehealth: Payer: Self-pay | Admitting: *Deleted

## 2019-11-03 ENCOUNTER — Ambulatory Visit: Payer: Medicare HMO | Attending: Cardiovascular Disease | Admitting: Cardiovascular Disease

## 2019-11-03 DIAGNOSIS — R400 Somnolence: Secondary | ICD-10-CM | POA: Diagnosis not present

## 2019-11-03 DIAGNOSIS — Z23 Encounter for immunization: Secondary | ICD-10-CM | POA: Diagnosis not present

## 2019-11-03 NOTE — Telephone Encounter (Signed)
Received a call from [facility] that patient does not meet the criteria for in lab sleep study. Recommended withdraw case and do HST. Case withdrawn. Patient has already been scheduled for HST.

## 2019-11-20 ENCOUNTER — Encounter: Payer: Self-pay | Admitting: Cardiovascular Disease

## 2019-11-20 NOTE — Procedures (Signed)
  [facility]        Patient Name: Hector [name], Hector [name] Date: 11/03/2019 Gender: Male D.O.B: 27-Apr-1941 Age (years): 41 Referring Provider: Nicki Guadalajara MD, ABSM Height (inches): 71 Interpreting Physician: Nicki Guadalajara MD, ABSM Weight (lbs): 227 RPSGT: Peak, Robert BMI: 32 MRN: [id number] Neck Size: <br>  CLINICAL INFORMATION Sleep Study Type: HST  Indication for sleep study: resistant hypertension, snoring  Epworth Sleepiness Score: 2   SLEEP STUDY TECHNIQUE A multi-channel overnight portable sleep study was performed. The channels recorded were: nasal airflow, thoracic respiratory movement, and oxygen saturation with a pulse oximetry. Snoring was also monitored.  MEDICATIONS amLODipine (NORVASC) 10 MG tablet  amLODipine (NORVASC) 10 MG tablet  aspirin (BAYER LOW DOSE) 81 MG chewable tablet  benazepril (LOTENSIN) 40 MG tablet  benazepril (LOTENSIN) 40 MG tablet  carvedilol (COREG) 12.5 MG tablet  cloNIDine (CATAPRES) 0.1 MG tablet  gemfibrozil (LOPID) 600 MG tablet  gemfibrozil (LOPID) 600 MG tablet  indapamide (LOZOL) 1.25 MG tablet  spironolactone (ALDACTONE) 25 MG tablet  Patient self administered medications include: N/A.  SLEEP ARCHITECTURE Patient was studied for 299 minutes. The sleep efficiency was 68.2 % and the patient was supine for 8.5%. The arousal index was 0.0 per hour.  RESPIRATORY PARAMETERS The overall AHI was 2.6 per hour, with a central apnea index of 0.2 per hour.  The oxygen nadir was 90% during sleep.  CARDIAC DATA Mean heart rate during sleep was 42.0 bpm. The lowest heart rate was sinus bradycardia at 37 bpm.  IMPRESSIONS - No significant obstructive sleep apnea occurred during this home study (AHI 2.6/h). However, the presence of sleep apnea during REM sleep cannot be assessed on the home study. - No significant central sleep apnea occurred during this study (CAI = 0.2/h). - The patient had no oxygen desaturation during the study  (Min O2 = 90%) - Patient snored 39.1% during the sleep.  DIAGNOSIS - Increased upper airway resistance syndrome (UARS) - Snoring - Sinus Bradycardia  RECOMMENDATIONS - At present there is no indication for CPAP therapy. - Effort should be made to optimize nasal and oropharyngeal patency. - Consider alternatives for treatment of snoring.  - Avoid alcohol, sedatives and other CNS depressants that may worsen sleep apnea and disrupt normal sleep architecture. - Sleep hygiene should be reviewed to assess factors that may improve sleep quality. - Weight management (BMI 32) and regular exercise should be initiated or continued.   [Electronically signed] 11/20/2019 01:32 PM  Nicki Guadalajara MD, [facility], ABSM Diplomate, American Board of Sleep Medicine   NPI: [id number] [facility] PH: [phone]   FX: [phone] ACCREDITED BY THE AMERICAN ACADEMY OF SLEEP MEDICINE

## 2019-11-21 ENCOUNTER — Ambulatory Visit: Payer: Medicare HMO | Admitting: Cardiology

## 2019-11-21 ENCOUNTER — Encounter: Payer: Self-pay | Admitting: Cardiology

## 2019-11-21 ENCOUNTER — Telehealth: Payer: Self-pay | Admitting: *Deleted

## 2019-11-21 ENCOUNTER — Other Ambulatory Visit: Payer: Self-pay

## 2019-11-21 ENCOUNTER — Other Ambulatory Visit: Payer: Self-pay | Admitting: Family Medicine

## 2019-11-21 VITALS — BP 188/74 | HR 54 | Temp 97.5°F | Ht 71.0 in | Wt 229.0 lb

## 2019-11-21 DIAGNOSIS — I10 Essential (primary) hypertension: Secondary | ICD-10-CM | POA: Diagnosis not present

## 2019-11-21 DIAGNOSIS — E782 Mixed hyperlipidemia: Secondary | ICD-10-CM

## 2019-11-21 MED ORDER — CLONIDINE HCL 0.1 MG PO TABS: 0.1000 mg | ORAL_TABLET | Freq: Two times a day (BID) | ORAL | 3 refills | Status: DC

## 2019-11-21 NOTE — Patient Instructions (Signed)
Medication Instructions:  Your physician recommends that you continue on your current medications as directed. Please refer to the Current Medication list given to you today.    I refilled your clonidine  *If you need a refill on your cardiac medications before your next appointment, please call your pharmacy*   Lab Work: none If you have labs (blood work) drawn today and your tests are completely normal, you will receive your results only by: Marland Kitchen MyChart Message (if you have MyChart) OR . A paper copy in the mail If you have any lab test that is abnormal or we need to change your treatment, we will call you to review the results.   Testing/Procedures: none   Follow-Up: At [facility], you and your health needs are our priority.  As part of our continuing mission to provide you with exceptional heart care, we have created designated Provider Care Teams.  These Care Teams include your primary Cardiologist (physician) and Advanced Practice Providers (APPs -  Physician Assistants and Nurse Practitioners) who all work together to provide you with the care you need, when you need it.  We recommend signing up for the patient portal called "MyChart".  Sign up information is provided on this After Visit Summary.  MyChart is used to connect with patients for Virtual Visits (Telemedicine).  Patients are able to view lab/test results, encounter notes, upcoming appointments, etc.  Non-urgent messages can be sent to your provider as well.   To learn more about what you can do with MyChart, go to [email or web address].    Your follow up with Dr.McDowell will be determined after your consultation with Hypertension Clinic    You will get a call from the [facility] office to schedule the apt      Thank you for choosing [facility] !

## 2019-11-21 NOTE — Progress Notes (Signed)
Cardiology Office Note  Date: 11/21/2019   ID: Loreto, Loescher 1941-04-08, MRN [id number]  PCP:  Merlyn Albert, MD  Cardiologist:  Nona Dell, MD Electrophysiologist:  None   Chief Complaint  Patient presents with  . Follow-up hypertension    History of Present Illness: Hector [name] is a 79 y.o. male last seen in January.  He presents for a follow-up visit.  At the last visit clonidine was added to his multimodal antihypertensive regimen.  He reports compliance with therapy.  I reviewed recent home blood pressure checks, still not well controlled, systolics 180s to 200 range.  His heart rate is in the 50s and precludes further up titration of carvedilol.  Echocardiogram from January revealed LVEF 65 to 70% with moderate LVH and mild diastolic dysfunction, mild left atrial enlargement, and mild mitral regurgitation.  Recent home sleep study interpretation noted by Dr. Tresa Endo, no clear indication for CPAP therapy suggested.  I spoke with him today about referral to our hypertension clinic in [place].  Past Medical History:  Diagnosis Date  . Chronic hip pain    Left hip pain  . ED (erectile dysfunction)   . Essential hypertension    Onset age 49  . Hyperlipidemia   . IFG (impaired fasting glucose)   . Noncompliance   . Obesity   . Osteoarthritis     Past Surgical History:  Procedure Laterality Date  . BILATERAL KNEE ARTHROSCOPY    . CHOLECYSTECTOMY  2010  . COLONOSCOPY N/A 03/21/2015   Procedure: COLONOSCOPY;  Surgeon: Corbin Ade, MD;  Location: [facility];  Service: Endoscopy;  Laterality: N/A;  9:30 AM  . HIP FRACTURE SURGERY  2009   LEFT HIP  . KNEE ARTHROTOMY BILATERAL 50 + YRS AGO    . REFRACTIVE SURGERY Bilateral    LASIK  . TOTAL HIP ARTHROPLASTY Left 07/24/2014   Procedure: CONVERSION OF PREVIOUS HIP SURGERY LEFT HIP ARTHROPLASTY;  Surgeon: [name], MD;  Location: [facility];  Service: Orthopedics;  Laterality: Left;  . TOTAL  KNEE ARTHROPLASTY Right 02/15/2019   Procedure: TOTAL KNEE ARTHROPLASTY;  Surgeon: Durene Romans, MD;  Location: [facility];  Service: Orthopedics;  Laterality: Right;  70 mins    Current Outpatient Medications  Medication Sig Dispense Refill  . amLODipine (NORVASC) 10 MG tablet TAKE 1 TABLET EVERY DAY 90 tablet 0  . aspirin (BAYER LOW DOSE) 81 MG chewable tablet Bayer Chewable Low Dose Aspirin 81 mg tablet  CHEW ONE TABLET BY MOUTH TWICE DAILY FOR 30 DAYS, THEN RESUME REGULAR DOSE    . benazepril (LOTENSIN) 40 MG tablet Take 1 tablet (40 mg total) by mouth 2 (two) times daily. 180 tablet 3  . carvedilol (COREG) 12.5 MG tablet Take 1 tablet (12.5 mg total) by mouth 2 (two) times daily. 60 tablet 3  . cloNIDine (CATAPRES) 0.1 MG tablet Take 1 tablet (0.1 mg total) by mouth 2 (two) times daily. 180 tablet 3  . gemfibrozil (LOPID) 600 MG tablet TAKE 1 TABLET TWICE DAILY BEFORE MEALS 180 tablet 0  . spironolactone (ALDACTONE) 25 MG tablet Take 1 tablet (25 mg total) by mouth daily. 90 tablet 3   No current facility-administered medications for this visit.   Allergies:  Hctz [hydrochlorothiazide] and Lipitor [atorvastatin]   ROS:   No syncope.  Physical Exam: VS:  BP (!) 188/74   Pulse (!) 54   Temp (!) 97.5 F (36.4 C)   Ht 5\' 11"  (1.803 m)  Wt 229 lb (103.9 kg)   SpO2 98%   BMI 31.94 kg/m , BMI Body mass index is 31.94 kg/m.  Wt Readings from Last 3 Encounters:  11/21/19 229 lb (103.9 kg)  10/28/19 227 lb (103 kg)  10/17/19 227 lb (103 kg)    General: Elderly male, appears comfortable at rest. HEENT: Conjunctiva and lids normal, wearing a mask. Neck: Supple, no elevated JVP or carotid bruits, no thyromegaly. Lungs: Clear to auscultation, nonlabored breathing at rest. Cardiac: Regular rate and rhythm, no S3 or significant systolic murmur, no pericardial rub. Abdomen: Soft, nontender, bowel sounds present. Extremities: No pitting edema, distal pulses 2+.  ECG:  An ECG dated  10/28/2019 was personally reviewed today and demonstrated:  Sinus bradycardia with prolonged PR interval and right bundle branch block.  Recent Labwork: 02/16/2019: Hemoglobin 11.7; Platelets 274 07/28/2019: BUN 15; Creatinine, Ser 1.15; Potassium 4.2; Sodium 139     Component Value Date/Time   CHOL 127 08/02/2018 0844   TRIG 145 08/02/2018 0844   HDL 30 (L) 08/02/2018 0844   CHOLHDL 4.2 08/02/2018 0844   CHOLHDL 4.3 10/31/2014 0944   VLDL 35 10/31/2014 0944   LDLCALC 68 08/02/2018 0844    Other Studies Reviewed Today:  Abdominal ultrasound and renal Dopplers 07/14/2019: Summary: Largest Aortic Diameter: 2.9 cm  Renal:  Right: Normal size right kidney. Normal right Resisitive Index. No evidence of right renal artery stenosis. RRV flow present. Normal cortical thickness of right kidney. Left: Normal size of left kidney. Abnormal left Resisitve Index. Normal cortical thickness of the left kidney. No evidence of left renal artery stenosis. LRV flow present. Mesenteric: Normal Celiac artery and Superior Mesenteric artery findings. IVC was patent.  Echocardiogram 10/21/2019: 1. Left ventricular ejection fraction, by visual estimation, is 65 to  70%. The left ventricle has hyperdynamic function. There is moderately  increased left ventricular hypertrophy.  2. Elevated left ventricular end-diastolic pressure.  3. Left ventricular diastolic parameters are consistent with Grade I  diastolic dysfunction (impaired relaxation).  4. The left ventricle has no regional wall motion abnormalities.  5. Global right ventricle has normal systolic function.The right  ventricular size is normal. No increase in right ventricular wall  thickness.  6. Left atrial size was mildly dilated.  7. Right atrial size was normal.  8. The mitral valve is grossly normal. Mild mitral valve regurgitation.  9. The tricuspid valve is grossly normal.  10. The tricuspid  valve is grossly normal. Tricuspid valve regurgitation  is trivial.  11. The aortic valve is tricuspid. Aortic valve regurgitation is not  visualized. Mild aortic valve sclerosis without stenosis.  12. The pulmonic valve was grossly normal. Pulmonic valve regurgitation is  not visualized.  13. The inferior vena cava is dilated in size with >50% respiratory  variability, suggesting right atrial pressure of 8 mmHg.   Assessment and Plan:  1.  Uncontrolled hypertension.  He is on multimodal therapy including carvedilol, Aldactone, Norvasc, lisinopril, and clonidine.  Unable to further uptitrate beta-blocker given low heart rate and other doses are at max or therapeutic range.  No evidence of renal artery stenosis and recent home sleep study did not suggest need for CPAP.  Plan is to refer him to our hypertension clinic in [place].  My expectation is that adjustments will be made and if his blood pressure comes under better control, he can resume follow-up here and with his PCP.  2.  Mixed hyperlipidemia with history of statin intolerance.  He continues to take gemfibrozil  and follows with Dr. Wolfgang Phoenix.  Medication Adjustments/Labs and Tests Ordered: Current medicines are reviewed at length with the patient today.  Concerns regarding medicines are outlined above.   Tests Ordered: No orders of the defined types were placed in this encounter.   Medication Changes: Meds ordered this encounter  Medications  . cloNIDine (CATAPRES) 0.1 MG tablet    Sig: Take 1 tablet (0.1 mg total) by mouth 2 (two) times daily.    Dispense:  180 tablet    Refill:  3    Disposition:  Follow up Hypertension clinic in [place].  Signed, Satira Sark, MD, [facility] 11/21/2019 9:48 AM    [place] [facility] [address] [place], [place] [address] Phone: [phone]; Fax: [phone]

## 2019-11-21 NOTE — Telephone Encounter (Signed)
Spoke with patient and scheduled visit for 3/4 Patient aware of date, time, and location

## 2019-11-21 NOTE — Telephone Encounter (Signed)
-----   Message from Nori Riis, RN sent at 11/21/2019  9:45 AM EST ----- Regarding: ref to HTN clinic Ref to HTN clinic for uncontrolled HTN per Dr.McDowell

## 2019-11-22 ENCOUNTER — Telehealth: Payer: Self-pay | Admitting: Family Medicine

## 2019-11-22 NOTE — Telephone Encounter (Signed)
Prescription sent electronically to pharmacy. Patient notified. 

## 2019-11-22 NOTE — Telephone Encounter (Signed)
Pt needs refill on amLODipine (NORVASC) 10 MG tablet  Pt usually gets a 90 supply through [facility] Mail order  Pt has physical scheduled for 12/05/2019   Please advise & call pt

## 2019-11-24 ENCOUNTER — Ambulatory Visit: Payer: Medicare HMO | Admitting: Cardiovascular Disease

## 2019-11-29 ENCOUNTER — Telehealth: Payer: Self-pay | Admitting: *Deleted

## 2019-11-29 NOTE — Telephone Encounter (Signed)
Left message to return a call to me to discuss HST results.

## 2019-12-01 DIAGNOSIS — Z23 Encounter for immunization: Secondary | ICD-10-CM | POA: Diagnosis not present

## 2019-12-05 ENCOUNTER — Ambulatory Visit (INDEPENDENT_AMBULATORY_CARE_PROVIDER_SITE_OTHER): Payer: Medicare HMO | Admitting: Family Medicine

## 2019-12-05 ENCOUNTER — Other Ambulatory Visit: Payer: Self-pay

## 2019-12-05 ENCOUNTER — Encounter: Payer: Self-pay | Admitting: Family Medicine

## 2019-12-05 VITALS — BP 136/82 | Temp 97.4°F | Ht 71.0 in | Wt 229.4 lb

## 2019-12-05 DIAGNOSIS — R739 Hyperglycemia, unspecified: Secondary | ICD-10-CM

## 2019-12-05 DIAGNOSIS — Z0000 Encounter for general adult medical examination without abnormal findings: Secondary | ICD-10-CM

## 2019-12-05 DIAGNOSIS — Z79899 Other long term (current) drug therapy: Secondary | ICD-10-CM | POA: Diagnosis not present

## 2019-12-05 DIAGNOSIS — E785 Hyperlipidemia, unspecified: Secondary | ICD-10-CM

## 2019-12-05 DIAGNOSIS — R7301 Impaired fasting glucose: Secondary | ICD-10-CM | POA: Diagnosis not present

## 2019-12-05 DIAGNOSIS — I10 Essential (primary) hypertension: Secondary | ICD-10-CM

## 2019-12-05 MED ORDER — GEMFIBROZIL 600 MG PO TABS: ORAL_TABLET | ORAL | 1 refills | Status: DC

## 2019-12-05 MED ORDER — ZOSTER VAC RECOMB ADJUVANTED 50 MCG/0.5ML IM SUSR: 0.5000 mL | Freq: Once | INTRAMUSCULAR | 1 refills | Status: AC

## 2019-12-05 NOTE — Progress Notes (Signed)
Subjective:    Patient ID: Hector [name], male    DOB: [birth year], 79 y.o.   MRN: [id number]  HPI  AWV- Annual Wellness Visit  The patient was seen for their annual wellness visit. The patient's past medical history, surgical history, and family history were reviewed. Pertinent vaccines were reviewed ( tetanus, pneumonia, shingles, flu) The patient's medication list was reviewed and updated.  The height and weight were entered.  BMI recorded in electronic record elsewhere  Cognitive screening was completed. Outcome of Mini - Cog: pass   Falls /depression screening electronically recorded within record elsewhere  Current tobacco usage: none (All patients who use tobacco were given written and verbal information on quitting)  Recent listing of emergency department/hospitalizations over the past year were reviewed.  current specialist the patient sees on a regular basis: cardiology- going to HTN clinic in May   Medicare annual wellness visit patient questionnaire was reviewed.  A written screening schedule for the patient for the next 5-10 years was given. Appropriate discussion of followup regarding next visit was discussed.  Patient stated he has had both of his Covid vaccines   Blood pressure medicine and blood pressure levels reviewed today with patient. Compliant with blood pressure medicine. States does not miss a dose. No obvious side effects. Blood pressure generally good when checked elsewhere. Watching salt intake.   Has had sleep apnea test   Echo sholwed mild regurg of a valve   Dr Domenic Polite wants a hypertension clinic  tp's bp 10 syst and diast 70s Review of Systems  Constitutional: Negative for activity change, appetite change and fever.  HENT: Negative for congestion and rhinorrhea.   Eyes: Negative for discharge.  Respiratory: Negative for cough and wheezing.   Cardiovascular: Negative for chest pain.  Gastrointestinal: Negative for abdominal  pain, blood in stool and vomiting.  Genitourinary: Negative for difficulty urinating and frequency.  Musculoskeletal: Negative for neck pain.  Skin: Negative for rash.  Allergic/Immunologic: Negative for environmental allergies and food allergies.  Neurological: Negative for weakness and headaches.  Psychiatric/Behavioral: Negative for agitation.  All other systems reviewed and are negative.      Objective:   Physical Exam Vitals reviewed.  Constitutional:      Appearance: He is well-developed.  HENT:     Head: Normocephalic and atraumatic.     Right Ear: External ear normal.     Left Ear: External ear normal.     Nose: Nose normal.  Eyes:     Pupils: Pupils are equal, round, and reactive to light.  Neck:     Thyroid: No thyromegaly.  Cardiovascular:     Rate and Rhythm: Normal rate and regular rhythm.     Heart sounds: Normal heart sounds. No murmur.  Pulmonary:     Effort: Pulmonary effort is normal. No respiratory distress.     Breath sounds: Normal breath sounds. No wheezing.  Abdominal:     General: Bowel sounds are normal. There is no distension.     Palpations: Abdomen is soft. There is no mass.     Tenderness: There is no abdominal tenderness.  Genitourinary:    Penis: Normal.   Musculoskeletal:        General: Normal range of motion.     Cervical back: Normal range of motion and neck supple.  Lymphadenopathy:     Cervical: No cervical adenopathy.  Skin:    General: Skin is warm and dry.     Findings: No erythema.  Neurological:  Mental Status: He is alert.     Motor: No abnormal muscle tone.  Psychiatric:        Behavior: Behavior normal.        Judgment: Judgment normal.           Assessment & Plan:  Impression wellness exam.  Diet discussed.  Exercise discussed.  Up-to-date on pneumococcal and flu vaccines.  Patient has had Covid vaccines.  Shingrix vaccine prescription given.  2.  Hypertension.  Difficult to control.  Blood pressure today  amazingly in excellent control.  3.  Hyperlipidemia.  Discussed.  Appropriate blood work.  4.  History of elevated sugar.  Will assess  Diet exercise discussed.  Follow-up in 6 months.  Encouraged going to the blood pressure clinic because of ongoing challenges with elevated blood pressure

## 2019-12-21 ENCOUNTER — Telehealth: Payer: Self-pay | Admitting: *Deleted

## 2019-12-21 NOTE — Telephone Encounter (Signed)
Patient notified of HST results and recommendations. He voiced understanding and had no questions.

## 2020-01-05 ENCOUNTER — Ambulatory Visit: Payer: Medicare HMO | Admitting: Cardiovascular Disease

## 2020-01-09 DIAGNOSIS — R739 Hyperglycemia, unspecified: Secondary | ICD-10-CM | POA: Diagnosis not present

## 2020-01-09 DIAGNOSIS — Z79899 Other long term (current) drug therapy: Secondary | ICD-10-CM | POA: Diagnosis not present

## 2020-01-09 DIAGNOSIS — E785 Hyperlipidemia, unspecified: Secondary | ICD-10-CM | POA: Diagnosis not present

## 2020-01-09 DIAGNOSIS — I10 Essential (primary) hypertension: Secondary | ICD-10-CM | POA: Diagnosis not present

## 2020-01-10 LAB — BASIC METABOLIC PANEL
BUN/Creatinine Ratio: 16 (ref 10–24)
BUN: 16 mg/dL (ref 8–27)
CO2: 18 mmol/L — ABNORMAL LOW (ref 20–29)
Calcium: 9.7 mg/dL (ref 8.6–10.2)
Chloride: 105 mmol/L (ref 96–106)
Creatinine, Ser: 1.01 mg/dL (ref 0.76–1.27)
GFR calc Af Amer: 83 mL/min/{1.73_m2} (ref >59)
GFR calc non Af Amer: 71 mL/min/{1.73_m2} (ref >59)
Glucose: 117 mg/dL — ABNORMAL HIGH (ref 65–99)
Potassium: 4 mmol/L (ref 3.5–5.2)
Sodium: 141 mmol/L (ref 134–144)

## 2020-01-10 LAB — LIPID PANEL
Chol/HDL Ratio: 4.7 ratio (ref 0.0–5.0)
Cholesterol, Total: 131 mg/dL (ref 100–199)
HDL: 28 mg/dL — ABNORMAL LOW (ref >39)
LDL Chol Calc (NIH): 82 mg/dL (ref 0–99)
Triglycerides: 112 mg/dL (ref 0–149)
VLDL Cholesterol Cal: 21 mg/dL (ref 5–40)

## 2020-01-10 LAB — HEPATIC FUNCTION PANEL
ALT: 24 IU/L (ref 0–44)
AST: 23 IU/L (ref 0–40)
Albumin: 4.4 g/dL (ref 3.7–4.7)
Alkaline Phosphatase: 70 IU/L (ref 39–117)
Bilirubin Total: 0.4 mg/dL (ref 0.0–1.2)
Bilirubin, Direct: 0.14 mg/dL (ref 0.00–0.40)
Total Protein: 7.1 g/dL (ref 6.0–8.5)

## 2020-01-10 LAB — CBC WITH DIFFERENTIAL/PLATELET
Basophils Absolute: 0.1 10*3/uL (ref 0.0–0.2)
Basos: 1 %
EOS (ABSOLUTE): 0.6 10*3/uL — ABNORMAL HIGH (ref 0.0–0.4)
Eos: 7 %
Hematocrit: 34.7 % — ABNORMAL LOW (ref 37.5–51.0)
Hemoglobin: 12.2 g/dL — ABNORMAL LOW (ref 13.0–17.7)
Immature Grans (Abs): 0.1 10*3/uL (ref 0.0–0.1)
Immature Granulocytes: 1 %
Lymphocytes Absolute: 1.6 10*3/uL (ref 0.7–3.1)
Lymphs: 19 %
MCH: 31.4 pg (ref 26.6–33.0)
MCHC: 35.2 g/dL (ref 31.5–35.7)
MCV: 89 fL (ref 79–97)
Monocytes Absolute: 1 10*3/uL — ABNORMAL HIGH (ref 0.1–0.9)
Monocytes: 12 %
Neutrophils Absolute: 5 10*3/uL (ref 1.4–7.0)
Neutrophils: 60 %
Platelets: 256 10*3/uL (ref 150–450)
RBC: 3.88 x10E6/uL — ABNORMAL LOW (ref 4.14–5.80)
RDW: 14.7 % (ref 11.6–15.4)
WBC: 8.2 10*3/uL (ref 3.4–10.8)

## 2020-01-10 LAB — HEMOGLOBIN A1C
Est. average glucose Bld gHb Est-mCnc: 137 mg/dL
Hgb A1c MFr Bld: 6.4 % — ABNORMAL HIGH (ref 4.8–5.6)

## 2020-01-22 ENCOUNTER — Encounter: Payer: Self-pay | Admitting: Family Medicine

## 2020-01-30 ENCOUNTER — Ambulatory Visit: Payer: Medicare HMO | Admitting: Cardiovascular Disease

## 2020-01-31 ENCOUNTER — Other Ambulatory Visit: Payer: Self-pay | Admitting: Student

## 2020-02-05 ENCOUNTER — Other Ambulatory Visit: Payer: Self-pay | Admitting: Family Medicine

## 2020-03-28 ENCOUNTER — Other Ambulatory Visit: Payer: Self-pay | Admitting: Family Medicine

## 2020-03-29 NOTE — Telephone Encounter (Signed)
90-day on all 3 needs follow-up by fall

## 2020-04-23 ENCOUNTER — Telehealth: Payer: Self-pay | Admitting: Family Medicine

## 2020-04-23 DIAGNOSIS — Z0000 Encounter for general adult medical examination without abnormal findings: Secondary | ICD-10-CM

## 2020-04-23 DIAGNOSIS — I10 Essential (primary) hypertension: Secondary | ICD-10-CM

## 2020-04-23 DIAGNOSIS — Z79899 Other long term (current) drug therapy: Secondary | ICD-10-CM

## 2020-04-23 DIAGNOSIS — R7301 Impaired fasting glucose: Secondary | ICD-10-CM

## 2020-04-23 DIAGNOSIS — E785 Hyperlipidemia, unspecified: Secondary | ICD-10-CM

## 2020-04-23 NOTE — Telephone Encounter (Signed)
Patient has 6 month follow up in September and needing labs done.

## 2020-04-23 NOTE — Telephone Encounter (Signed)
Last labs completed on 01/09/20 CBC, A1C, BMET, HEPATIC, LIPID. Please advise. Thank you

## 2020-04-26 ENCOUNTER — Other Ambulatory Visit: Payer: Self-pay | Admitting: Family Medicine

## 2020-04-26 NOTE — Telephone Encounter (Signed)
Lab orders placed; left message to return call  

## 2020-04-26 NOTE — Telephone Encounter (Signed)
Yes, pls order. Thx. Dr. Karie Schwalbe

## 2020-05-02 NOTE — Telephone Encounter (Signed)
Left detailed message per DPR  

## 2020-05-18 DIAGNOSIS — H5203 Hypermetropia, bilateral: Secondary | ICD-10-CM | POA: Diagnosis not present

## 2020-05-18 DIAGNOSIS — H52223 Regular astigmatism, bilateral: Secondary | ICD-10-CM | POA: Diagnosis not present

## 2020-05-18 DIAGNOSIS — H25813 Combined forms of age-related cataract, bilateral: Secondary | ICD-10-CM | POA: Diagnosis not present

## 2020-05-18 DIAGNOSIS — H524 Presbyopia: Secondary | ICD-10-CM | POA: Diagnosis not present

## 2020-05-22 DIAGNOSIS — R7301 Impaired fasting glucose: Secondary | ICD-10-CM | POA: Diagnosis not present

## 2020-05-22 DIAGNOSIS — E785 Hyperlipidemia, unspecified: Secondary | ICD-10-CM | POA: Diagnosis not present

## 2020-05-22 DIAGNOSIS — Z0000 Encounter for general adult medical examination without abnormal findings: Secondary | ICD-10-CM | POA: Diagnosis not present

## 2020-05-22 DIAGNOSIS — I10 Essential (primary) hypertension: Secondary | ICD-10-CM | POA: Diagnosis not present

## 2020-05-22 DIAGNOSIS — Z79899 Other long term (current) drug therapy: Secondary | ICD-10-CM | POA: Diagnosis not present

## 2020-05-23 LAB — CBC WITH DIFFERENTIAL/PLATELET
Basophils Absolute: 0 10*3/uL (ref 0.0–0.2)
Basos: 1 %
EOS (ABSOLUTE): 0.7 10*3/uL — ABNORMAL HIGH (ref 0.0–0.4)
Eos: 10 %
Hematocrit: 29.9 % — ABNORMAL LOW (ref 37.5–51.0)
Hemoglobin: 10.3 g/dL — ABNORMAL LOW (ref 13.0–17.7)
Immature Grans (Abs): 0 10*3/uL (ref 0.0–0.1)
Immature Granulocytes: 1 %
Lymphocytes Absolute: 1.6 10*3/uL (ref 0.7–3.1)
Lymphs: 23 %
MCH: 31 pg (ref 26.6–33.0)
MCHC: 34.4 g/dL (ref 31.5–35.7)
MCV: 90 fL (ref 79–97)
Monocytes Absolute: 0.8 10*3/uL (ref 0.1–0.9)
Monocytes: 11 %
Neutrophils Absolute: 3.9 10*3/uL (ref 1.4–7.0)
Neutrophils: 54 %
Platelets: 308 10*3/uL (ref 150–450)
RBC: 3.32 x10E6/uL — ABNORMAL LOW (ref 4.14–5.80)
RDW: 15.5 % — ABNORMAL HIGH (ref 11.6–15.4)
WBC: 7 10*3/uL (ref 3.4–10.8)

## 2020-05-23 LAB — LIPID PANEL
Chol/HDL Ratio: 3.3 ratio (ref 0.0–5.0)
Cholesterol, Total: 107 mg/dL (ref 100–199)
HDL: 32 mg/dL — ABNORMAL LOW (ref >39)
LDL Chol Calc (NIH): 59 mg/dL (ref 0–99)
Triglycerides: 81 mg/dL (ref 0–149)
VLDL Cholesterol Cal: 16 mg/dL (ref 5–40)

## 2020-05-23 LAB — HEPATIC FUNCTION PANEL
ALT: 23 IU/L (ref 0–44)
AST: 20 IU/L (ref 0–40)
Albumin: 4.5 g/dL (ref 3.7–4.7)
Alkaline Phosphatase: 75 IU/L (ref 48–121)
Bilirubin Total: 0.4 mg/dL (ref 0.0–1.2)
Bilirubin, Direct: 0.13 mg/dL (ref 0.00–0.40)
Total Protein: 6.8 g/dL (ref 6.0–8.5)

## 2020-05-23 LAB — BASIC METABOLIC PANEL
BUN/Creatinine Ratio: 19 (ref 10–24)
BUN: 24 mg/dL (ref 8–27)
CO2: 19 mmol/L — ABNORMAL LOW (ref 20–29)
Calcium: 9.6 mg/dL (ref 8.6–10.2)
Chloride: 108 mmol/L — ABNORMAL HIGH (ref 96–106)
Creatinine, Ser: 1.28 mg/dL — ABNORMAL HIGH (ref 0.76–1.27)
GFR calc Af Amer: 62 mL/min/{1.73_m2} (ref >59)
GFR calc non Af Amer: 54 mL/min/{1.73_m2} — ABNORMAL LOW (ref >59)
Glucose: 99 mg/dL (ref 65–99)
Potassium: 4.7 mmol/L (ref 3.5–5.2)
Sodium: 141 mmol/L (ref 134–144)

## 2020-05-23 LAB — HEMOGLOBIN A1C
Est. average glucose Bld gHb Est-mCnc: 126 mg/dL
Hgb A1c MFr Bld: 6 % — ABNORMAL HIGH (ref 4.8–5.6)

## 2020-05-30 ENCOUNTER — Ambulatory Visit: Payer: Medicare HMO | Admitting: Family Medicine

## 2020-06-14 ENCOUNTER — Other Ambulatory Visit: Payer: Self-pay

## 2020-06-14 ENCOUNTER — Encounter: Payer: Self-pay | Admitting: Family Medicine

## 2020-06-14 ENCOUNTER — Encounter: Payer: Self-pay | Admitting: Internal Medicine

## 2020-06-14 ENCOUNTER — Ambulatory Visit (INDEPENDENT_AMBULATORY_CARE_PROVIDER_SITE_OTHER): Payer: Medicare HMO | Admitting: Family Medicine

## 2020-06-14 VITALS — BP 132/76 | HR 51 | Temp 95.6°F | Ht 71.0 in | Wt 201.0 lb

## 2020-06-14 DIAGNOSIS — D649 Anemia, unspecified: Secondary | ICD-10-CM | POA: Diagnosis not present

## 2020-06-14 DIAGNOSIS — E782 Mixed hyperlipidemia: Secondary | ICD-10-CM

## 2020-06-14 DIAGNOSIS — I10 Essential (primary) hypertension: Secondary | ICD-10-CM

## 2020-06-14 MED ORDER — GEMFIBROZIL 600 MG PO TABS: ORAL_TABLET | ORAL | 1 refills | Status: DC

## 2020-06-14 MED ORDER — AMLODIPINE BESYLATE 10 MG PO TABS: ORAL_TABLET | ORAL | 1 refills | Status: DC

## 2020-06-14 MED ORDER — INDAPAMIDE 1.25 MG PO TABS: ORAL_TABLET | ORAL | 1 refills | Status: DC

## 2020-06-14 NOTE — Progress Notes (Signed)
Patient ID: Hector [name], male    DOB: Dec 21, 1940, 79 y.o.   MRN: [id number]   Chief Complaint  Patient presents with  . Hyperlipidemia   Subjective:    HPI  HLD- doing well no new concerns.  Compliant with meds. No chest pain, palpitations, myalgias or joint pains.  H/o anemia- Wasn't able to give blood in 9/21 due to low hb at 10. Not seeing blood or black stool. Has increased with iron pill since then. Had colonoscopy at 79 yrs old. And normal per pt.  HTN Pt compliant with BP meds.  No SEs Denies chest pain, sob, LE swelling, or blurry vision.    Medical History Hector [name] has a past medical history of Chronic hip pain, ED (erectile dysfunction), Essential hypertension, Hyperlipidemia, IFG (impaired fasting glucose), Noncompliance, Obesity, and Osteoarthritis.   Outpatient Encounter Medications as of 06/14/2020  Medication Sig  . amLODipine (NORVASC) 10 MG tablet TAKE 1 TABLET EVERY DAY (NEED AN APPOINTMENT FOR FURTHER REFILLS)  . aspirin (BAYER LOW DOSE) 81 MG chewable tablet Bayer Chewable Low Dose Aspirin 81 mg tablet  CHEW ONE TABLET BY MOUTH TWICE DAILY FOR 30 DAYS, THEN RESUME REGULAR DOSE  . benazepril (LOTENSIN) 40 MG tablet Take 1 tablet (40 mg total) by mouth 2 (two) times daily.  . carvedilol (COREG) 12.5 MG tablet TAKE 1 TABLET TWICE DAILY  . cloNIDine (CATAPRES) 0.1 MG tablet Take 1 tablet (0.1 mg total) by mouth 2 (two) times daily.  Marland Kitchen gemfibrozil (LOPID) 600 MG tablet TAKE 1 TABLET TWICE DAILY BEFORE MEALS  . indapamide (LOZOL) 1.25 MG tablet TAKE 1 TABLET EVERY DAY IN THE MORNING  . spironolactone (ALDACTONE) 25 MG tablet TAKE 1 TABLET EVERY DAY  . [DISCONTINUED] amLODipine (NORVASC) 10 MG tablet TAKE 1 TABLET EVERY DAY (NEED AN APPOINTMENT FOR FURTHER REFILLS)  . [DISCONTINUED] gemfibrozil (LOPID) 600 MG tablet TAKE 1 TABLET TWICE DAILY BEFORE MEALS  . [DISCONTINUED] indapamide (LOZOL) 1.25 MG tablet TAKE 1 TABLET EVERY DAY IN THE MORNING   No  facility-administered encounter medications on file as of 06/14/2020.     Review of Systems  Constitutional: Negative for chills and fever.  HENT: Negative for congestion, rhinorrhea and sore throat.   Respiratory: Negative for cough, shortness of breath and wheezing.   Cardiovascular: Negative for chest pain and leg swelling.  Gastrointestinal: Negative for abdominal pain, diarrhea, nausea and vomiting.  Genitourinary: Negative for dysuria and frequency.  Skin: Negative for rash.  Neurological: Negative for dizziness, weakness and headaches.     Vitals BP 132/76   Pulse (!) 51   Temp (!) 95.6 F (35.3 C)   Ht 5\' 11"  (1.803 m)   Wt 201 lb (91.2 kg)   SpO2 98%   BMI 28.03 kg/m   Objective:   Physical Exam Vitals and nursing note reviewed.  Constitutional:      General: He is not in acute distress.    Appearance: Normal appearance. He is not ill-appearing.  HENT:     Head: Normocephalic.     Nose: Nose normal. No congestion.     Mouth/Throat:     Mouth: Mucous membranes are moist.     Pharynx: No oropharyngeal exudate.  Eyes:     Extraocular Movements: Extraocular movements intact.     Conjunctiva/sclera: Conjunctivae normal.     Pupils: Pupils are equal, round, and reactive to light.  Cardiovascular:     Rate and Rhythm: Regular rhythm. Bradycardia present.     Pulses: Normal  pulses.     Heart sounds: Normal heart sounds. No murmur heard.   Pulmonary:     Effort: Pulmonary effort is normal.     Breath sounds: Normal breath sounds. No wheezing, rhonchi or rales.  Musculoskeletal:        General: Normal range of motion.     Right lower leg: No edema.     Left lower leg: No edema.  Skin:    General: Skin is warm and dry.     Findings: No rash.  Neurological:     General: No focal deficit present.     Mental Status: He is alert and oriented to person, place, and time.     Cranial Nerves: No cranial nerve deficit.  Psychiatric:        Mood and Affect: Mood  normal.        Behavior: Behavior normal.        Thought Content: Thought content normal.        Judgment: Judgment normal.     Assessment and Plan   1. Hyperlipemia, mixed  2. Anemia, unspecified type - Ambulatory referral to Gastroenterology - CBC With Differential - Iron Binding Cap (TIBC)([facility]) - Iron - Ferritin - POC Hemoccult Bld/Stl (3-Cd Home Screen); Future  3. Essential hypertension, benign   Anemia-  Needs GI consult.  Referral given. Giving hemoccult cards x3. Not on baby asa anymore.  Thinking stopped asa last year. Taking iron tablet for past month.  htn- controlled majority of time, a few numbers are low. Pt to cont to journal and record when feeling faitgued and what happened that dya with his eating.  hld- stable, cont lopid.  F/u 29mo or prn.  Addendum- Hb on 06/14/20- inc from 10.3 to 11.4. Iron, tibc, and iron sat- normal. Ferritin high.

## 2020-06-15 LAB — CBC WITH DIFFERENTIAL
Basophils Absolute: 0.1 10*3/uL (ref 0.0–0.2)
Basos: 1 %
EOS (ABSOLUTE): 0.9 10*3/uL — ABNORMAL HIGH (ref 0.0–0.4)
Eos: 13 %
Hematocrit: 34.6 % — ABNORMAL LOW (ref 37.5–51.0)
Hemoglobin: 11.4 g/dL — ABNORMAL LOW (ref 13.0–17.7)
Immature Grans (Abs): 0 10*3/uL (ref 0.0–0.1)
Immature Granulocytes: 1 %
Lymphocytes Absolute: 1.4 10*3/uL (ref 0.7–3.1)
Lymphs: 20 %
MCH: 31.1 pg (ref 26.6–33.0)
MCHC: 32.9 g/dL (ref 31.5–35.7)
MCV: 95 fL (ref 79–97)
Monocytes Absolute: 0.8 10*3/uL (ref 0.1–0.9)
Monocytes: 11 %
Neutrophils Absolute: 3.9 10*3/uL (ref 1.4–7.0)
Neutrophils: 54 %
RBC: 3.66 x10E6/uL — ABNORMAL LOW (ref 4.14–5.80)
RDW: 16 % — ABNORMAL HIGH (ref 11.6–15.4)
WBC: 7.1 10*3/uL (ref 3.4–10.8)

## 2020-06-15 LAB — FERRITIN: Ferritin: 689 ng/mL — ABNORMAL HIGH (ref 30–400)

## 2020-06-15 LAB — IRON AND TIBC
Iron Saturation: 29 % (ref 15–55)
Iron: 103 ug/dL (ref 38–169)
Total Iron Binding Capacity: 351 ug/dL (ref 250–450)
UIBC: 248 ug/dL (ref 111–343)

## 2020-06-18 DIAGNOSIS — H2511 Age-related nuclear cataract, right eye: Secondary | ICD-10-CM | POA: Diagnosis not present

## 2020-06-18 DIAGNOSIS — H2513 Age-related nuclear cataract, bilateral: Secondary | ICD-10-CM | POA: Diagnosis not present

## 2020-07-10 DIAGNOSIS — H25042 Posterior subcapsular polar age-related cataract, left eye: Secondary | ICD-10-CM | POA: Diagnosis not present

## 2020-07-10 DIAGNOSIS — H2512 Age-related nuclear cataract, left eye: Secondary | ICD-10-CM | POA: Diagnosis not present

## 2020-07-10 DIAGNOSIS — H25012 Cortical age-related cataract, left eye: Secondary | ICD-10-CM | POA: Diagnosis not present

## 2020-07-10 DIAGNOSIS — H2511 Age-related nuclear cataract, right eye: Secondary | ICD-10-CM | POA: Diagnosis not present

## 2020-07-17 DIAGNOSIS — H2512 Age-related nuclear cataract, left eye: Secondary | ICD-10-CM | POA: Diagnosis not present

## 2020-07-19 ENCOUNTER — Other Ambulatory Visit: Payer: Self-pay | Admitting: *Deleted

## 2020-07-19 DIAGNOSIS — D649 Anemia, unspecified: Secondary | ICD-10-CM

## 2020-07-19 LAB — POC HEMOCCULT BLD/STL (HOME/3-CARD/SCREEN)
Card #2 Fecal Occult Blod, POC: NEGATIVE
Card #3 Fecal Occult Blood, POC: NEGATIVE
Fecal Occult Blood, POC: NEGATIVE

## 2020-08-06 ENCOUNTER — Ambulatory Visit: Payer: Medicare HMO | Admitting: Gastroenterology

## 2020-08-15 ENCOUNTER — Other Ambulatory Visit: Payer: Self-pay | Admitting: Student

## 2020-08-15 NOTE — Telephone Encounter (Signed)
This is a [place] pt.  °

## 2020-09-20 ENCOUNTER — Ambulatory Visit: Payer: Medicare HMO | Admitting: Gastroenterology

## 2020-10-23 NOTE — Progress Notes (Signed)
Referring Provider: Erven Colla, DO Primary Care Physician:  Erven Colla, DO Primary Gastroenterologist:  Dr. Gala Romney  Chief Complaint  Patient presents with   Anemia   change in bowels    Loose 4-6 times per day, sometimes liquid    HPI:   Hector [name] is a 80 y.o. male presenting today at the request of Erven Colla, DO for anemia.  Per chart review, hemoglobin previously 14.6 on 02/09/2019, then 11.7 on 02/16/2019.  Hemoglobin has remained within 10-low 12 range with most recent hemoglobin 11.4 on 06/14/2020.  He has normocytic indices.  Ferritin elevated at 689, iron 23, iron saturation 29% in September 2021.  He is on oral iron.  Hemoccult negative x3 in September/October 2021. Mild elevation of Creatinine in May 2020 and August 2021.   Last colonoscopy in June 2016: Multiple anal papilla-1 prominent, normal-appearing rectal mucosa, scattered left-sided diverticula, the remainder of the exam was normal.  No recommendations to repeat colonoscopy.  Today:   HTN: BP has been running a little high at home. In the 150s-170s at home. No chest pain, blurry vision, or headache. Took morning HTN meds.   Anemia:  Used to give blood routinely, then he was told he was anemic and couldn't give. In December, he "met the mark", and was able to donate blood. Taking OTC oral iron ([place]). Started this about 2-3 months ago.  No bright red blood in the stools. Stools are dark green. Started after iron. No black stools. No other obvious blood loss. No fatigue, lightheadedness, dizziness, weakness.  No nausea, vomiting, acid reflux symptoms, or dysphagia.   Stools are soft/loose. Can be liquid about once a week. This has been going on for at least a year. Noticed this starting after several changes in blood pressure medications. Typically with 4-6 BMs daily, but may have up to 10 BMs a day.  Having 10 BMs daily is fairly rare.  BMs are small in amount. Used to have 1-2 soft  formed BMs daily.  Tends to have a bowel movement if he goes to urinate.  Wakes up to urinate in the middle of the night and passes some stool at that time. Over the last 6 months is when intermittent liquid BMs started.  No identified food triggers.  He noticed some worsening since starting iron.  Also reports over the last year, he has made significant dietary changes. Now eating mostly salads and vegetables.  Very few meats.  When he was told he was anemic, he started eating liver 1-2 times a week.   No sick contacts. No antibiotics or hospitalizations in the last 6 months. Intermittent cramping prior to having a BM. Resolves after a BM. More gas than he used to. Some urgency with bowel movements. Not taking anything for diarrhea.   Has lost about 20 lbs over the last year which he attributes to diet change. Used to eat a lot of "junk foods".   Occasional ibuprofen use for joint pain but nothing routinely.   Past Medical History:  Diagnosis Date   Chronic hip pain    Left hip pain   ED (erectile dysfunction)    Essential hypertension    Onset age 26   Hyperlipidemia    IFG (impaired fasting glucose)    Noncompliance    Obesity    Osteoarthritis     Past Surgical History:  Procedure Laterality Date   BILATERAL KNEE ARTHROSCOPY     CHOLECYSTECTOMY  2010  COLONOSCOPY N/A 03/21/2015   Surgeon: Daneil Dolin, MD;  Multiple anal papilla-1 prominent, normal-appearing rectal mucosa, scattered left-sided diverticula, the remainder of the exam was normal.  No recommendations to repeat colonoscopy.   HIP FRACTURE SURGERY  2009   LEFT HIP   KNEE ARTHROTOMY BILATERAL 50 + YRS AGO     REFRACTIVE SURGERY Bilateral    LASIK   TOTAL HIP ARTHROPLASTY Left 07/24/2014   Procedure: CONVERSION OF PREVIOUS HIP SURGERY LEFT HIP ARTHROPLASTY;  Surgeon: Mauri Pole, MD;  Location: [facility];  Service: Orthopedics;  Laterality: Left;   TOTAL KNEE ARTHROPLASTY Right 02/15/2019   Procedure:  TOTAL KNEE ARTHROPLASTY;  Surgeon: Paralee Cancel, MD;  Location: [facility];  Service: Orthopedics;  Laterality: Right;  70 mins    Current Outpatient Medications  Medication Sig Dispense Refill   amLODipine (NORVASC) 10 MG tablet TAKE 1 TABLET EVERY DAY (NEED AN APPOINTMENT FOR FURTHER REFILLS) 90 tablet 1   benazepril (LOTENSIN) 40 MG tablet TAKE 1 TABLET TWICE DAILY 180 tablet 3   carvedilol (COREG) 12.5 MG tablet TAKE 1 TABLET TWICE DAILY 180 tablet 3   cloNIDine (CATAPRES) 0.1 MG tablet Take 1 tablet (0.1 mg total) by mouth 2 (two) times daily. 180 tablet 3   gemfibrozil (LOPID) 600 MG tablet TAKE 1 TABLET TWICE DAILY BEFORE MEALS 180 tablet 1   spironolactone (ALDACTONE) 25 MG tablet TAKE 1 TABLET EVERY DAY 90 tablet 3   No current facility-administered medications for this visit.    Allergies as of 10/24/2020 - Review Complete 10/24/2020  Allergen Reaction Noted   Hctz [hydrochlorothiazide]  01/07/2013   Lipitor [atorvastatin]  01/07/2013    Family History  Problem Relation Age of Onset   Hypertension Mother    Diabetes Mother    Hypertension Father    Heart attack Father    Diabetes Sister    Colon cancer Neg Hx    Inflammatory bowel disease Neg Hx     Social History   Socioeconomic History   Marital status: Married    Spouse name: Not on file   Number of children: Not on file   Years of education: Not on file   Highest education level: Not on file  Occupational History   Not on file  Tobacco Use   Smoking status: Former Smoker    Types: Pipe    Quit date: 1963    Years since quitting: 59.1   Smokeless tobacco: Never Used   Tobacco comment: quit 40-50 years ago  Vaping Use   Vaping Use: Never used  Substance and Sexual Activity   Alcohol use: No    Comment: QUIT SMOKING AGE 51   Drug use: No   Sexual activity: Not on file  Other Topics Concern   Not on file  Social History Narrative   Not on file   Social Determinants of  Health   Financial Resource Strain: Not on file  Food Insecurity: Not on file  Transportation Needs: Not on file  Physical Activity: Not on file  Stress: Not on file  Social Connections: Not on file  Intimate Partner Violence: Not on file    Review of Systems: Gen: Denies any fever, chills, cold or flulike symptoms. CV: Denies chest pain or heart palpitations. Resp: Denies shortness of breath or cough. GI: See HPI GU : Denies hematuria MS: Occasional joint pain. Derm: Denies rash Psych: Denies depression or anxiety Heme: See HPI  Physical Exam: BP (!) 160/78    Pulse Marland Kitchen)  57    Temp (!) 97.3 F (36.3 C)    Ht $R'5\' 11"'rS$  (1.803 m)    Wt 207 lb 6.4 oz (94.1 kg)    BMI 28.93 kg/m  General:   Alert and oriented. Pleasant and cooperative. Well-nourished and well-developed.  Head:  Normocephalic and atraumatic. Eyes:  Without icterus, sclera clear and conjunctiva pink.  Ears:  Normal auditory acuity. Lungs:  Clear to auscultation bilaterally. No wheezes, rales, or rhonchi. No distress.  Heart:  S1, S2 present without murmurs appreciated.  Abdomen:  +BS, soft, non-tender and non-distended. No HSM noted. No guarding or rebound. No masses appreciated.  Small reducible umbilical hernia. Rectal:  Deferred  Msk:  Symmetrical without gross deformities. Normal posture. Extremities:  Without edema. Neurologic:  Alert and  oriented x4;  grossly normal neurologically. Skin:  Intact without significant lesions or rashes. Psych: Normal mood and affect.

## 2020-10-24 ENCOUNTER — Encounter: Payer: Self-pay | Admitting: Gastroenterology

## 2020-10-24 ENCOUNTER — Other Ambulatory Visit: Payer: Self-pay

## 2020-10-24 ENCOUNTER — Ambulatory Visit: Payer: Medicare HMO | Admitting: Gastroenterology

## 2020-10-24 VITALS — BP 160/78 | HR 57 | Temp 97.3°F | Ht 71.0 in | Wt 207.4 lb

## 2020-10-24 DIAGNOSIS — R194 Change in bowel habit: Secondary | ICD-10-CM | POA: Diagnosis not present

## 2020-10-24 DIAGNOSIS — D649 Anemia, unspecified: Secondary | ICD-10-CM | POA: Diagnosis not present

## 2020-10-24 DIAGNOSIS — I10 Essential (primary) hypertension: Secondary | ICD-10-CM | POA: Diagnosis not present

## 2020-10-24 NOTE — Assessment & Plan Note (Addendum)
80 year old male presenting today for further evaluation of anemia.  Hemoglobin previously 14.6 on 02/09/2019, then 11.7 on 02/16/2019.  Hemoglobin has remained in the 10-low 12 range with most recent hemoglobin 11.4 on 06/14/2020.  Normocytic indices.  In September 2021, ferritin elevated at 689, iron 23, iron saturation 29%.  Patient reports he started oral iron 2-3 months ago.  Hemoccult negative x3 in September/October 2021.  Mild elevation of creatinine in May 2020 and August 2021.  No B12 or folate on file. Denies overt GI bleeding or any other blood loss. No routine NSAID use. No upper GI symptoms. He does report change in bowel habits over the last year from 1-2 soft, formed BMs daily to frequent soft/mushy small-volume BMs with occasional liquid stools.  This started after several blood pressure medication changes over the last year, significant dietary changes, and worsened after starting iron.  Lost about 20 pounds over the last year which he attributes to significant dietary changes and eating primarily vegetables with few meats.  Last colonoscopy June 2016 with multiple anal papilla, normal-appearing rectal mucosa, scattered left-sided diverticula, otherwise normal with no recommendations to repeat.  No prior EGD.  Less likely GI etiology of anemia considering no iron deficiency and Hemoccult negative x3.  May be related to mild CKD and significant dietary changes over the last year. However, with change in bowel habits, I have offered patient colonoscopy.  He has requested to think on this a little further.  We will plan to update CBC, BMP, iron panel, B12, folate, IgA, and TTG IgA. Will touch base with patient regarding colonoscopy after labs are completed.

## 2020-10-24 NOTE — Assessment & Plan Note (Addendum)
80 year old male reporting change in bowel habits over the last year from 1-2 soft, formed BMs daily to frequent soft/loose low-volume BMs daily with occasional liquid BMs and increased gas. Typically with 4-6 BMs daily, but has had up to 10 BMs, this is rare.  Intermittent abdominal cramping prior to BMs that resolves thereafter.  Some associated urgency with bowel movements.  Reports when he urinates, he will also pass a small amount of stool. Denies brbpr or melena. He has had multiple blood pressure medication changes over the year and started iron a few months ago secondary to anemia (discussed above) which he feels has worsened diarrhea.  Also with significant dietary changes, currently eating primarily salads and vegetables with very few meats. Lost 20 pounds over the last year, stable over the last 5 months.   No regular NSAID use. No antibiotics or hospitalizations. Abdominal exam benign.   Differential includes med effect, diet, thyroid abnormalities, celiac disease, SIBO, microscopic colitis, IBS, over flow diarrhea, and malignancy. Not consistent with infectious diarrhea, as watery stools are not predominant. I suspect weight loss is likely secondary to significant dietary changes.  I have offered patient a colonoscopy to evaluate further, but patient has asked to think about this.   Plan:  CBC, BMP, TSH, IgA, TTG IgA Continue eating plenty of fruits and vegetables daily for now.  Drink enough water to keep urine pale yellow to clear.  Will discuss colonoscopy again with patient once labs result. If he declines, consider DG abdomen to evaluate for over flow diarrhea.  Advised to call if watery stools become more frequent and we would complete stool studies.  Follow-up in 3 months.

## 2020-10-24 NOTE — Patient Instructions (Addendum)
Please call your primary care provider today to schedule follow-up on high blood pressure.  Your blood pressure initially was 206/70.  Upon repeat today, it was down to 160/78.  If you develop any chest pain, severe headache, blurry vision, please proceed to the emergency room.  Please have labs completed at [facility]. We will call you with results.   Please think about whether or not you would be agreeable to proceed with a colonoscopy for further evaluation of change in bowel habits and anemia.  Continue eating plenty of fruits and vegetables daily.  Drink enough water to keep urine pale yellow to clear.  Please let me know of any worsening of your diarrhea or if you develop frequent watery stools.   We will plan to follow-up in 3 months but will be in touch with you once we have lab results.   Ermalinda Memos, PA-C [facility]

## 2020-10-24 NOTE — Assessment & Plan Note (Signed)
Chronic history of HTN. Currently on multiple medications. BP initially quite elevated at 206/70. Repeat BP 160/78.  He is asymptomatic.  Denies chest pain, headache, blurry vision.  Advised to call PCP today to arrange follow-up on HTN.  ED precautions discussed.

## 2020-10-25 ENCOUNTER — Encounter: Payer: Self-pay | Admitting: Internal Medicine

## 2020-10-26 DIAGNOSIS — R194 Change in bowel habit: Secondary | ICD-10-CM | POA: Diagnosis not present

## 2020-10-26 DIAGNOSIS — D649 Anemia, unspecified: Secondary | ICD-10-CM | POA: Diagnosis not present

## 2020-10-29 ENCOUNTER — Telehealth: Payer: Self-pay | Admitting: Internal Medicine

## 2020-10-29 LAB — BASIC METABOLIC PANEL
BUN/Creatinine Ratio: 12 (ref 10–24)
BUN: 13 mg/dL (ref 8–27)
CO2: 21 mmol/L (ref 20–29)
Calcium: 9.3 mg/dL (ref 8.6–10.2)
Chloride: 105 mmol/L (ref 96–106)
Creatinine, Ser: 1.07 mg/dL (ref 0.76–1.27)
GFR calc Af Amer: 76 mL/min/{1.73_m2} (ref >59)
GFR calc non Af Amer: 66 mL/min/{1.73_m2} (ref >59)
Glucose: 98 mg/dL (ref 65–99)
Potassium: 4 mmol/L (ref 3.5–5.2)
Sodium: 142 mmol/L (ref 134–144)

## 2020-10-29 LAB — CBC WITH DIFFERENTIAL/PLATELET
Basophils Absolute: 0.1 10*3/uL (ref 0.0–0.2)
Basos: 1 %
EOS (ABSOLUTE): 0.7 10*3/uL — ABNORMAL HIGH (ref 0.0–0.4)
Eos: 10 %
Hematocrit: 35.1 % — ABNORMAL LOW (ref 37.5–51.0)
Hemoglobin: 12.1 g/dL — ABNORMAL LOW (ref 13.0–17.7)
Immature Grans (Abs): 0 10*3/uL (ref 0.0–0.1)
Immature Granulocytes: 1 %
Lymphocytes Absolute: 1.6 10*3/uL (ref 0.7–3.1)
Lymphs: 22 %
MCH: 30.4 pg (ref 26.6–33.0)
MCHC: 34.5 g/dL (ref 31.5–35.7)
MCV: 88 fL (ref 79–97)
Monocytes Absolute: 0.8 10*3/uL (ref 0.1–0.9)
Monocytes: 11 %
Neutrophils Absolute: 4.1 10*3/uL (ref 1.4–7.0)
Neutrophils: 55 %
Platelets: 270 10*3/uL (ref 150–450)
RBC: 3.98 x10E6/uL — ABNORMAL LOW (ref 4.14–5.80)
RDW: 16.3 % — ABNORMAL HIGH (ref 11.6–15.4)
WBC: 7.3 10*3/uL (ref 3.4–10.8)

## 2020-10-29 LAB — FOLATE: Folate: 4.8 ng/mL (ref >3.0)

## 2020-10-29 LAB — IRON,TIBC AND FERRITIN PANEL
Ferritin: 501 ng/mL — ABNORMAL HIGH (ref 30–400)
Iron Saturation: 45 % (ref 15–55)
Iron: 145 ug/dL (ref 38–169)
Total Iron Binding Capacity: 322 ug/dL (ref 250–450)
UIBC: 177 ug/dL (ref 111–343)

## 2020-10-29 LAB — IGA: IgA/Immunoglobulin A, Serum: 200 mg/dL (ref 61–437)

## 2020-10-29 LAB — TSH: TSH: 2.59 u[IU]/mL (ref 0.450–4.500)

## 2020-10-29 LAB — TISSUE TRANSGLUTAMINASE, IGA: Transglutaminase IgA: <2 U/mL (ref 0–3)

## 2020-10-29 LAB — VITAMIN B12: Vitamin B-12: 412 pg/mL (ref 232–1245)

## 2020-10-29 NOTE — Telephone Encounter (Signed)
[phone] PATIENT RETURNED CALL   PLEASE CALL

## 2020-10-30 NOTE — Telephone Encounter (Signed)
See result note. Result were given to pt.

## 2020-11-14 DIAGNOSIS — H26491 Other secondary cataract, right eye: Secondary | ICD-10-CM | POA: Diagnosis not present

## 2020-11-14 DIAGNOSIS — Z961 Presence of intraocular lens: Secondary | ICD-10-CM | POA: Diagnosis not present

## 2020-11-14 DIAGNOSIS — H26493 Other secondary cataract, bilateral: Secondary | ICD-10-CM | POA: Diagnosis not present

## 2020-11-28 DIAGNOSIS — H26492 Other secondary cataract, left eye: Secondary | ICD-10-CM | POA: Diagnosis not present

## 2020-12-12 ENCOUNTER — Ambulatory Visit: Payer: Medicare HMO | Admitting: Family Medicine

## 2020-12-12 ENCOUNTER — Telehealth: Payer: Self-pay | Admitting: *Deleted

## 2020-12-12 ENCOUNTER — Telehealth (INDEPENDENT_AMBULATORY_CARE_PROVIDER_SITE_OTHER): Payer: Medicare HMO | Admitting: Family Medicine

## 2020-12-12 DIAGNOSIS — E782 Mixed hyperlipidemia: Secondary | ICD-10-CM | POA: Diagnosis not present

## 2020-12-12 DIAGNOSIS — I10 Essential (primary) hypertension: Secondary | ICD-10-CM | POA: Diagnosis not present

## 2020-12-12 DIAGNOSIS — D649 Anemia, unspecified: Secondary | ICD-10-CM | POA: Diagnosis not present

## 2020-12-12 MED ORDER — AMLODIPINE BESYLATE 10 MG PO TABS
ORAL_TABLET | ORAL | 1 refills | Status: DC
Start: 2020-12-12 — End: 2022-12-10

## 2020-12-12 MED ORDER — GEMFIBROZIL 600 MG PO TABS: ORAL_TABLET | ORAL | 1 refills | Status: DC

## 2020-12-12 NOTE — Telephone Encounter (Signed)
Mr. montavious, wierzba are scheduled for a virtual visit with your provider today.    Just as we do with appointments in the office, we must obtain your consent to participate.  Your consent will be active for this visit and any virtual visit you may have with one of our providers in the next 365 days.    If you have a MyChart account, I can also send a copy of this consent to you electronically.  All virtual visits are billed to your insurance company just like a traditional visit in the office.  As this is a virtual visit, video technology does not allow for your provider to perform a traditional examination.  This may limit your provider's ability to fully assess your condition.  If your provider identifies any concerns that need to be evaluated in person or the need to arrange testing such as labs, EKG, etc, we will make arrangements to do so.    Although advances in technology are sophisticated, we cannot ensure that it will always work on either your end or our end.  If the connection with a video visit is poor, we may have to switch to a telephone visit.  With either a video or telephone visit, we are not always able to ensure that we have a secure connection.   I need to obtain your verbal consent now.   Are you willing to proceed with your visit today?   LINLEY MOXLEY has provided verbal consent on 12/12/2020 for a virtual visit (video or telephone).

## 2020-12-12 NOTE — Progress Notes (Signed)
Patient ID: Hector [name], male    DOB: 1941-01-04, 80 y.o.   MRN: [id number]   Virtual Visit via Telephone Note  I connected with Hector [name] on 12/12/20 at  2:30 PM EDT by telephone and verified that I am speaking with the correct person using two identifiers.  Location: Patient: home Provider: office   I discussed the limitations, risks, security and privacy concerns of performing an evaluation and management service by telephone and the availability of in person appointments. I also discussed with the patient that there may be a patient responsible charge related to this service. The patient expressed understanding and agreed to proceed.   Chief Complaint  Patient presents with  . Hyperlipidemia    HTN Follow up- needs refill on his meds   Subjective:    HPI Phone visit to f/u with HLD, HTN and Anemia.  Anemia- hb 12.1  bp 130-150/ 70-80. Checking at home.  No chest pain, leg swelling or sob.   Seen by GI doctor- Had labs 2/22.  Having stomach pain. Recommended a colonoscopy per GI.  Pt not wanting to repeat the colonoscopy.   Had one in 2017, was ok per pt.   Had inc the iron since fall for low blood count. Not taking them daily.  Trying to eat more iron in diet, meat/liver, spinach. Pt has f/u with GI in 1 month.  Pt was not able to give blood in fall, Hb was too low.  Cholesterol numbers in 8/21 was normal range.  Doesn't know when next f/u with cards.  Medical History Hector [name] has a past medical history of Chronic hip pain, ED (erectile dysfunction), Essential hypertension, Hyperlipidemia, IFG (impaired fasting glucose), Noncompliance, Obesity, and Osteoarthritis.   Outpatient Encounter Medications as of 12/12/2020  Medication Sig  . amLODipine (NORVASC) 10 MG tablet TAKE 1 TABLET EVERY DAY  . benazepril (LOTENSIN) 40 MG tablet TAKE 1 TABLET TWICE DAILY  . carvedilol (COREG) 12.5 MG tablet TAKE 1 TABLET TWICE DAILY  . cloNIDine (CATAPRES) 0.1 MG  tablet Take 1 tablet (0.1 mg total) by mouth 2 (two) times daily.  Marland Kitchen gemfibrozil (LOPID) 600 MG tablet TAKE 1 TABLET TWICE DAILY BEFORE MEALS  . spironolactone (ALDACTONE) 25 MG tablet TAKE 1 TABLET EVERY DAY  . [DISCONTINUED] amLODipine (NORVASC) 10 MG tablet TAKE 1 TABLET EVERY DAY (NEED AN APPOINTMENT FOR FURTHER REFILLS)  . [DISCONTINUED] gemfibrozil (LOPID) 600 MG tablet TAKE 1 TABLET TWICE DAILY BEFORE MEALS   No facility-administered encounter medications on file as of 12/12/2020.     Review of Systems  Constitutional: Negative for chills and fever.  HENT: Negative for congestion, rhinorrhea and sore throat.   Respiratory: Negative for cough, shortness of breath and wheezing.   Cardiovascular: Negative for chest pain and leg swelling.  Gastrointestinal: Negative for abdominal pain, blood in stool, diarrhea, nausea and vomiting.  Genitourinary: Negative for dysuria and frequency.  Skin: Negative for rash.  Neurological: Negative for dizziness, weakness and headaches.     Vitals There were no vitals taken for this visit.  Objective:   Physical Exam  No PE due to phone visit.  Assessment and Plan   1. Essential hypertension, benign - amLODipine (NORVASC) 10 MG tablet; TAKE 1 TABLET EVERY DAY  Dispense: 90 tablet; Refill: 1  2. Hyperlipemia, mixed - gemfibrozil (LOPID) 600 MG tablet; TAKE 1 TABLET TWICE DAILY BEFORE MEALS  Dispense: 180 tablet; Refill: 1  3. Anemia, unspecified type    htn- per chart has  been uncontrolled.   Pt unable to check bp today since on phone visit. Pt seeing Dr. Diona Browner with cards.  Pt has very resistant hypertension.  advising to make sure to take medications daily and to check bp and if seeing numbers over 150 the call the office.  Pt has appt coming up with cards, advising to make sure to see them by May due to his mediations may be running out by then.   Also to f/u with GI for anemia.  HLD- stable.  Cont gemfibrozil. Check lipids on  next visit.  Pt in agreement.  Return in about 6 months (around 06/14/2021), or if symptoms worsen or fail to improve, for f/u HLD, anemia, htn.   Follow Up Instructions:    I discussed the assessment and treatment plan with the patient. The patient was provided an opportunity to ask questions and all were answered. The patient agreed with the plan and demonstrated an understanding of the instructions.   The patient was advised to call back or seek an in-person evaluation if the symptoms worsen or if the condition fails to improve as anticipated.  I provided 12 minutes of non-face-to-face time during this encounter.

## 2020-12-13 DIAGNOSIS — Z0100 Encounter for examination of eyes and vision without abnormal findings: Secondary | ICD-10-CM | POA: Diagnosis not present

## 2020-12-13 DIAGNOSIS — H26493 Other secondary cataract, bilateral: Secondary | ICD-10-CM | POA: Diagnosis not present

## 2020-12-13 DIAGNOSIS — Z961 Presence of intraocular lens: Secondary | ICD-10-CM | POA: Diagnosis not present

## 2020-12-21 ENCOUNTER — Other Ambulatory Visit: Payer: Self-pay | Admitting: Cardiology

## 2020-12-21 NOTE — Telephone Encounter (Signed)
Pt seen over a year ago, cancelled 3 apts made to HTN clinic. Refilled 1 month of clonidine, must make apt to be seen for future refills

## 2020-12-27 ENCOUNTER — Telehealth: Payer: Self-pay | Admitting: Family Medicine

## 2020-12-27 NOTE — Telephone Encounter (Signed)
Left message for patient to schedule Annual Wellness Visit.  Please schedule with Nurse Health Advisor Shannon, RN at [facility]  

## 2021-01-21 NOTE — Progress Notes (Signed)
Referring Provider: Annalee Genta, DO Primary Care Physician:  Annalee Genta, DO Primary GI Physician: Dr. Jena Gauss  Chief Complaint  Patient presents with  . Anemia    Doing ok, no bleeding  . change in bowel    "90% cleared up"    HPI:   Hector [name] is a 80 y.o. male presenting today for follow-up of anemia and change in bowel habits.  He was last seen in our office 10/24/2020 for the same.  Last colonoscopy June 2016 with multiple anal papilla-1 prominent, normal-appearing rectal mucosa, scattered left-sided diverticula, otherwise normal with no recommendations to repeat.  At the time of his last visit, it was noted patient had decline in hemoglobin from 14.6 to 11.7 in May 2020 with hemoglobin remaining in the 10-11 range.  He had normocytic indices and ferritin was elevated at 689, iron 43, iron saturation 29% in September 2021.  He was on oral iron x2-3 months.  Hemoccult was negative x3 in September/October 2021.  Also with mildly elevated creatinine.  No overt GI bleeding or other obvious blood loss.  No significant upper GI symptoms.  Noted to 1 year history of loose BMs which he reported starting after several blood pressure medication changes.  Typically with 4-6 BMs daily, but can have up to 10 BMs per day.  BMs were small in quantity.  Iron seemed to worsen symptoms. Previously with 1-2 soft, formed BMs daily.  Also reported making significant dietary changes over the last year, eating primarily salads and vegetables.  I offered patient a colonoscopy, but he preferred to think about this.  Plan to update labs, check thyroid function and screen for celiac disease, call if watery stools become more frequent.  Follow-up in 3 months.  Labs completed 10/26/2020 revealing hemoglobin 12.1 (up from 11.4 in September 2021), normocytic indices, ferritin 501 (H), iron saturation 45%, iron 145.  Celiac screen negative.  TSH within normal limits.   Kidney function normal.  Vitamin B12   412, folate 4.8.  On 2/9, patient stated he was feeling better.  Decreased frequency of BMs with 4 mushy BMs daily. Volume was normal and he felt bowel movements were complete.  He has not made any dietary adjustments or started any new medications.  He was eating ice cream every night.  He did not want to proceed with colonoscopy, and did not want to give up ice cream.  I advise he try to take Lactaid tablets prior to dairy consumption.  Call back if he would like to have a colonoscopy.  Today: 1-2 BMs daily. Bristol 4-5. Hasn't made any changes in his diet since his last appointment.  He has reduced iron to 3 to 4 days a week rather than daily.  No brbpr or melena.   No GERD symptoms, dysphagia, nausea, vomiting, or abdominal pain.  No NSAIDs.  Pulse was low at 44.  Patient states his pulse typically is in the mid to low 40s at home.  Has been this way for quite some time.  He is asymptomatic to this.  Denies chest pain, palpitations, shortness of breath, fatigue, lightheadedness, dizziness.  Has appointment next week with PCP.  Previously, he has seen Dr. Diona Browner. Last appointment was in March 2021.   Past Medical History:  Diagnosis Date  . Chronic hip pain    Left hip pain  . ED (erectile dysfunction)   . Essential hypertension    Onset age 89  . Hyperlipidemia   .  IFG (impaired fasting glucose)   . Noncompliance   . Obesity   . Osteoarthritis     Past Surgical History:  Procedure Laterality Date  . BILATERAL KNEE ARTHROSCOPY    . CHOLECYSTECTOMY  2010  . COLONOSCOPY N/A 03/21/2015   Surgeon: Corbin Ade, MD;  Multiple anal papilla-1 prominent, normal-appearing rectal mucosa, scattered left-sided diverticula, the remainder of the exam was normal.  No recommendations to repeat colonoscopy.  Marland Kitchen HIP FRACTURE SURGERY  2009   LEFT HIP  . KNEE ARTHROTOMY BILATERAL 50 + YRS AGO    . REFRACTIVE SURGERY Bilateral    LASIK  . TOTAL HIP ARTHROPLASTY Left 07/24/2014   Procedure:  CONVERSION OF PREVIOUS HIP SURGERY LEFT HIP ARTHROPLASTY;  Surgeon: [name], MD;  Location: [facility];  Service: Orthopedics;  Laterality: Left;  . TOTAL KNEE ARTHROPLASTY Right 02/15/2019   Procedure: TOTAL KNEE ARTHROPLASTY;  Surgeon: Durene Romans, MD;  Location: [facility];  Service: Orthopedics;  Laterality: Right;  70 mins    Current Outpatient Medications  Medication Sig Dispense Refill  . amLODipine (NORVASC) 10 MG tablet TAKE 1 TABLET EVERY DAY 90 tablet 1  . benazepril (LOTENSIN) 40 MG tablet TAKE 1 TABLET TWICE DAILY 180 tablet 3  . carvedilol (COREG) 12.5 MG tablet TAKE 1 TABLET TWICE DAILY 180 tablet 3  . cloNIDine (CATAPRES) 0.1 MG tablet TAKE 1 TABLET TWICE DAILY 60 tablet 0  . gemfibrozil (LOPID) 600 MG tablet TAKE 1 TABLET TWICE DAILY BEFORE MEALS 180 tablet 1  . spironolactone (ALDACTONE) 25 MG tablet TAKE 1 TABLET EVERY DAY 90 tablet 3   No current facility-administered medications for this visit.    Allergies as of 01/23/2021 - Review Complete 01/23/2021  Allergen Reaction Noted  . Hctz [hydrochlorothiazide]  01/07/2013  . Lipitor [atorvastatin]  01/07/2013    Family History  Problem Relation Age of Onset  . Hypertension Mother   . Diabetes Mother   . Hypertension Father   . Heart attack Father   . Diabetes Sister   . Colon cancer Neg Hx   . Inflammatory bowel disease Neg Hx     Social History   Socioeconomic History  . Marital status: Married    Spouse name: Not on file  . Number of children: Not on file  . Years of education: Not on file  . Highest education level: Not on file  Occupational History  . Not on file  Tobacco Use  . Smoking status: Former Smoker    Types: Pipe    Quit date: 1963    Years since quitting: 59.3  . Smokeless tobacco: Never Used  . Tobacco comment: quit 40-50 years ago  Vaping Use  . Vaping Use: Never used  Substance and Sexual Activity  . Alcohol use: No    Comment: QUIT SMOKING AGE 24  . Drug use: No  . Sexual  activity: Not on file  Other Topics Concern  . Not on file  Social History Narrative  . Not on file   Social Determinants of Health   Financial Resource Strain: Not on file  Food Insecurity: Not on file  Transportation Needs: Not on file  Physical Activity: Not on file  Stress: Not on file  Social Connections: Not on file    Review of Systems: Gen: Denies fever, chills, cold or flulike symptoms, lightheadedness, dizziness, presyncope, syncope. CV: Denies chest pain or palpitations. Resp: Denies dyspnea or cough. GI: See HPI Heme: See HPI  Physical Exam: BP (!) 162/71  Pulse (!) 44   Temp (!) 97.5 F (36.4 C) (Temporal)   Ht 5\' 11"  (1.803 m)   Wt 208 lb 6.4 oz (94.5 kg)   BMI 29.07 kg/m  General:   Alert and oriented. No distress noted. Pleasant and cooperative.  Head:  Normocephalic and atraumatic. Eyes:  Conjuctiva clear without scleral icterus. Heart:  S1, S2 present without murmurs appreciated. Lungs:  Clear to auscultation bilaterally. No wheezes, rales, or rhonchi. No distress.  Abdomen:  +BS, soft, non-tender and non-distended. No rebound or guarding. No HSM or masses noted. Msk:  Symmetrical without gross deformities. Normal posture. Extremities:  Without edema. Neurologic:  Alert and  oriented x4 Psych: Normal mood and affect.

## 2021-01-23 ENCOUNTER — Encounter: Payer: Self-pay | Admitting: Gastroenterology

## 2021-01-23 ENCOUNTER — Ambulatory Visit: Payer: Medicare HMO | Admitting: Gastroenterology

## 2021-01-23 ENCOUNTER — Other Ambulatory Visit: Payer: Self-pay

## 2021-01-23 VITALS — BP 162/71 | HR 44 | Temp 97.5°F | Ht 71.0 in | Wt 208.4 lb

## 2021-01-23 DIAGNOSIS — R194 Change in bowel habit: Secondary | ICD-10-CM | POA: Diagnosis not present

## 2021-01-23 DIAGNOSIS — R001 Bradycardia, unspecified: Secondary | ICD-10-CM | POA: Diagnosis not present

## 2021-01-23 DIAGNOSIS — D649 Anemia, unspecified: Secondary | ICD-10-CM

## 2021-01-23 NOTE — Progress Notes (Signed)
CC'ED TO PCP 

## 2021-01-23 NOTE — Assessment & Plan Note (Addendum)
80 year old male found to have decline in hemoglobin in May 2020.  Hemoglobin went from 14.6 down to 11.7 and remained in the 10-low 12 range with normocytic indices.  September 2021, ferritin was elevated at 689, iron 23, iron saturation 29%.  Hemoccult negative x3 in September/October 2021.  He started on oral iron around November 2022, currently taking 3 to 4 days a week.  Most recent labs 10/26/2020 with hemoglobin 12.1, normocytic indices, ferritin 501 (H), iron saturation 45%, iron 145.    Vitamin B12  412, folate 4.8.  Celiac screen negative.  Last colonoscopy in 2016 with multiple anal papilla-1 prominent, normal-appearing rectal mucosa, scattered left-sided diverticula, otherwise normal with no recommendations to repeat.  No prior EGD. He continues to deny overt GI bleeding.  No NSAIDs.  No significant upper or lower GI symptoms at this time.  Etiology of anemia is not clear.  He did have a mild bump in his creatinine last year, but most recently in February 2022, creatinine was within normal limits.  Likelihood of occult GI bleed is fairly low considering no iron deficiency and Hemoccult negative x3.  Discussed the possibility of EGD and colonoscopy again today.  Patient prefers to hold off on this.  Recommended he stop oral iron completely, and we will continue to monitor CBC and iron panel.  If declining hemoglobin or if he develops true iron deficiency, consider EGD and colonoscopy at that time.  Follow-up in 3 months with CBC and iron panel 1 week prior to office visit.

## 2021-01-23 NOTE — Assessment & Plan Note (Signed)
Chronic bradycardia.  Pulse is 44 today. Asymptomatic to this. Patient reports at home, his pulse is typically in the 40s as well.  Appears at prior office visits, pulse has typically been in the mid to low 50s.  He has been seen by cardiology in the past for uncontrolled hypertension.  Last seen in March 2021.  No follow-up scheduled.  He does have upcoming office visit with PCP next week.   I have advised that he reach out to cardiology today to follow-up on bradycardia.  He will also discuss this with PCP next week.

## 2021-01-23 NOTE — Assessment & Plan Note (Addendum)
80 year old male that experienced change in bowel habits over the last year following multiple blood pressure medication changes and starting oral iron. Last seen in February 2022 for this.  Work-up at that time with celiac screen negative, TSH within normal limits.  Offered a colonoscopy, but patient declined.  Today, patient reports symptoms have spontaneously resolved and bowels have returned to normal.  Notably, he has reduced iron 2-3-4 days a week rather than daily.  Usually with 1-2 Bristol 4-5 BMs daily.  No BRBPR, melena, or abdominal pain.  We will continue to monitor for now.

## 2021-01-23 NOTE — Patient Instructions (Signed)
Keep your follow-up appointment with primary care next week to have your blood rechecked.  Please ask for those results to be faxed to our office.  I recommend you stop taking iron for now. We will monitor your hemoglobin and iron panel moving forward.  Plan to have repeat blood work in 3 months at [facility].  We will arrange for this.  I would like for you to have this completed 1 week prior to your next visit with me.  Please reach out to cardiology today to follow-up on your low heart rate.    Follow-up in our office in 3 months.  Ermalinda Memos, PA-C [facility]

## 2021-02-14 ENCOUNTER — Other Ambulatory Visit: Payer: Self-pay | Admitting: Student

## 2021-02-21 ENCOUNTER — Other Ambulatory Visit: Payer: Self-pay | Admitting: Cardiology

## 2021-03-12 ENCOUNTER — Other Ambulatory Visit: Payer: Self-pay

## 2021-03-12 ENCOUNTER — Ambulatory Visit (INDEPENDENT_AMBULATORY_CARE_PROVIDER_SITE_OTHER): Payer: Medicare HMO

## 2021-03-12 DIAGNOSIS — Z0000 Encounter for general adult medical examination without abnormal findings: Secondary | ICD-10-CM | POA: Diagnosis not present

## 2021-03-12 NOTE — Patient Instructions (Signed)
Hector [name] , Thank you for taking time to come for your Medicare Wellness Visit. I appreciate your ongoing commitment to your health goals. Please review the following plan we discussed and let me know if I can assist you in the future.   Screening recommendations/referrals: Colonoscopy: No longer required  Recommended yearly ophthalmology/optometry visit for glaucoma screening and checkup Recommended yearly dental visit for hygiene and checkup  Vaccinations: Influenza vaccine: Up to date, next due fall 2022  Pneumococcal vaccine: completed series  Tdap vaccine: currently due, you may await and injury to receive  Shingles vaccine: Currently due for Shingrix, if you would like to receive we recommend that you do so at your local pharmacy    Advanced directives: Please bring in a copy of your advanced medical directives so that we may scan it into your chart.   Conditions/risks identified: None   Next appointment: 03/14/2021 @ 10:00 am with Dr. Ladona Ridgel   Preventive Care 78 Years and Older, Male Preventive care refers to lifestyle choices and visits with your health care provider that can promote health and wellness. What does preventive care include? A yearly physical exam. This is also called an annual well check. Dental exams once or twice a year. Routine eye exams. Ask your health care provider how often you should have your eyes checked. Personal lifestyle choices, including: Daily care of your teeth and gums. Regular physical activity. Eating a healthy diet. Avoiding tobacco and drug use. Limiting alcohol use. Practicing safe sex. Taking low doses of aspirin every day. Taking vitamin and mineral supplements as recommended by your health care provider. What happens during an annual well check? The services and screenings done by your health care provider during your annual well check will depend on your age, overall health, lifestyle risk factors, and family history of  disease. Counseling  Your health care provider may ask you questions about your: Alcohol use. Tobacco use. Drug use. Emotional well-being. Home and relationship well-being. Sexual activity. Eating habits. History of falls. Memory and ability to understand (cognition). Work and work Astronomer. Screening  You may have the following tests or measurements: Height, weight, and BMI. Blood pressure. Lipid and cholesterol levels. These may be checked every 5 years, or more frequently if you are over 78 years old. Skin check. Lung cancer screening. You may have this screening every year starting at age 78 if you have a 30-pack-year history of smoking and currently smoke or have quit within the past 15 years. Fecal occult blood test (FOBT) of the stool. You may have this test every year starting at age 78. Flexible sigmoidoscopy or colonoscopy. You may have a sigmoidoscopy every 5 years or a colonoscopy every 10 years starting at age 78. Prostate cancer screening. Recommendations will vary depending on your family history and other risks. Hepatitis C blood test. Hepatitis B blood test. Sexually transmitted disease (STD) testing. Diabetes screening. This is done by checking your blood sugar (glucose) after you have not eaten for a while (fasting). You may have this done every 1-3 years. Abdominal aortic aneurysm (AAA) screening. You may need this if you are a current or former smoker. Osteoporosis. You may be screened starting at age 78 if you are at high risk. Talk with your health care provider about your test results, treatment options, and if necessary, the need for more tests. Vaccines  Your health care provider may recommend certain vaccines, such as: Influenza vaccine. This is recommended every year. Tetanus, diphtheria, and acellular pertussis (Tdap,  Td) vaccine. You may need a Td booster every 10 years. Zoster vaccine. You may need this after age 47. Pneumococcal 13-valent  conjugate (PCV13) vaccine. One dose is recommended after age 78. Pneumococcal polysaccharide (PPSV23) vaccine. One dose is recommended after age 78. Talk to your health care provider about which screenings and vaccines you need and how often you need them. This information is not intended to replace advice given to you by your health care provider. Make sure you discuss any questions you have with your health care provider. Document Released: 10/05/2015 Document Revised: 05/28/2016 Document Reviewed: 07/10/2015 Elsevier Interactive Patient Education  2017 [employer].  Fall Prevention in the Home Falls can cause injuries. They can happen to people of all ages. There are many things you can do to make your home safe and to help prevent falls. What can I do on the outside of my home? Regularly fix the edges of walkways and driveways and fix any cracks. Remove anything that might make you trip as you walk through a door, such as a raised step or threshold. Trim any bushes or trees on the path to your home. Use bright outdoor lighting. Clear any walking paths of anything that might make someone trip, such as rocks or tools. Regularly check to see if handrails are loose or broken. Make sure that both sides of any steps have handrails. Any raised decks and porches should have guardrails on the edges. Have any leaves, snow, or [name] cleared regularly. Use sand or salt on walking paths during winter. Clean up any spills in your garage right away. This includes oil or grease spills. What can I do in the bathroom? Use night lights. Install grab bars by the toilet and in the tub and shower. Do not use towel bars as grab bars. Use non-skid mats or decals in the tub or shower. If you need to sit down in the shower, use a plastic, non-slip stool. Keep the floor dry. Clean up any water that spills on the floor as soon as it happens. Remove soap buildup in the tub or shower regularly. Attach bath mats  securely with double-sided non-slip rug tape. Do not have throw rugs and other things on the floor that can make you trip. What can I do in the bedroom? Use night lights. Make sure that you have a light by your bed that is easy to reach. Do not use any sheets or blankets that are too big for your bed. They should not hang down onto the floor. Have a firm chair that has side arms. You can use this for support while you get dressed. Do not have throw rugs and other things on the floor that can make you trip. What can I do in the kitchen? Clean up any spills right away. Avoid walking on wet floors. Keep items that you use a lot in easy-to-reach places. If you need to reach something above you, use a strong step stool that has a grab bar. Keep electrical cords out of the way. Do not use floor polish or wax that makes floors slippery. If you must use wax, use non-skid floor wax. Do not have throw rugs and other things on the floor that can make you trip. What can I do with my stairs? Do not leave any items on the stairs. Make sure that there are handrails on both sides of the stairs and use them. Fix handrails that are broken or loose. Make sure that handrails are as  long as the stairways. Check any carpeting to make sure that it is firmly attached to the stairs. Fix any carpet that is loose or worn. Avoid having throw rugs at the top or bottom of the stairs. If you do have throw rugs, attach them to the floor with carpet tape. Make sure that you have a light switch at the top of the stairs and the bottom of the stairs. If you do not have them, ask someone to add them for you. What else can I do to help prevent falls? Wear shoes that: Do not have high heels. Have rubber bottoms. Are comfortable and fit you well. Are closed at the toe. Do not wear sandals. If you use a stepladder: Make sure that it is fully opened. Do not climb a closed stepladder. Make sure that both sides of the stepladder  are locked into place. Ask someone to hold it for you, if possible. Clearly mark and make sure that you can see: Any grab bars or handrails. First and last steps. Where the edge of each step is. Use tools that help you move around (mobility aids) if they are needed. These include: Canes. Walkers. Scooters. Crutches. Turn on the lights when you go into a dark area. Replace any light bulbs as soon as they burn out. Set up your furniture so you have a clear path. Avoid moving your furniture around. If any of your floors are uneven, fix them. If there are any pets around you, be aware of where they are. Review your medicines with your doctor. Some medicines can make you feel dizzy. This can increase your chance of falling. Ask your doctor what other things that you can do to help prevent falls. This information is not intended to replace advice given to you by your health care provider. Make sure you discuss any questions you have with your health care provider. Document Released: 07/05/2009 Document Revised: 02/14/2016 Document Reviewed: 10/13/2014 Elsevier Interactive Patient Education  2017 [employer].

## 2021-03-12 NOTE — Progress Notes (Addendum)
Subjective:   Hector [name] is a 80 y.o. male who presents for Medicare Annual/Subsequent preventive examination.  I connected with Suann Larry  today by telephone and verified that I am speaking with the correct person using two identifiers. Location patient: home Location provider: work Persons participating in the virtual visit: patient, provider.   I discussed the limitations, risks, security and privacy concerns of performing an evaluation and management service by telephone and the availability of in person appointments. I also discussed with the patient that there may be a patient responsible charge related to this service. The patient expressed understanding and verbally consented to this telephonic visit.    Interactive audio and video telecommunications were attempted between this provider and patient, however failed, due to patient having technical difficulties OR patient did not have access to video capability.  We continued and completed visit with audio only.     Review of Systems    N/A  Cardiac Risk Factors include: advanced age (>47men, >17 women);male gender;hypertension;dyslipidemia     Objective:    Today's Vitals   There is no height or weight on file to calculate BMI.  Advanced Directives 03/12/2021 02/15/2019 02/09/2019 04/23/2017 03/21/2015 07/24/2014 07/24/2014  Does Patient Have a Medical Advance Directive? Yes Yes Yes Yes Yes Yes Yes  Type of Estate agent of [place];Living will Healthcare Power of [employer] - Healthcare Power of [place];Living will - Healthcare Power of [place];Living will Healthcare Power of [place];Living will  Does patient want to make changes to medical advance directive? No - Patient declined No - Patient declined - - - No - Patient declined -  Copy of Healthcare Power of Attorney in Chart? No - copy requested - - No - copy requested No - copy requested No - copy requested (No Data)    Current Medications  (verified) Outpatient Encounter Medications as of 03/12/2021  Medication Sig   amLODipine (NORVASC) 10 MG tablet TAKE 1 TABLET EVERY DAY   benazepril (LOTENSIN) 40 MG tablet TAKE 1 TABLET TWICE DAILY   carvedilol (COREG) 12.5 MG tablet TAKE 1 TABLET TWICE DAILY   cloNIDine (CATAPRES) 0.1 MG tablet TAKE 1 TABLET TWICE DAILY   gemfibrozil (LOPID) 600 MG tablet TAKE 1 TABLET TWICE DAILY BEFORE MEALS   spironolactone (ALDACTONE) 25 MG tablet TAKE 1 TABLET EVERY DAY   No facility-administered encounter medications on file as of 03/12/2021.    Allergies (verified) Hctz [hydrochlorothiazide] and Lipitor [atorvastatin]   History: Past Medical History:  Diagnosis Date   Chronic hip pain    Left hip pain   ED (erectile dysfunction)    Essential hypertension    Onset age 56   Hyperlipidemia    IFG (impaired fasting glucose)    Noncompliance    Obesity    Osteoarthritis    Past Surgical History:  Procedure Laterality Date   BILATERAL KNEE ARTHROSCOPY     CHOLECYSTECTOMY  2010   COLONOSCOPY N/A 03/21/2015   Surgeon: Corbin Ade, MD;  Multiple anal papilla-1 prominent, normal-appearing rectal mucosa, scattered left-sided diverticula, the remainder of the exam was normal.  No recommendations to repeat colonoscopy.   HIP FRACTURE SURGERY  2009   LEFT HIP   KNEE ARTHROTOMY BILATERAL 50 + YRS AGO     REFRACTIVE SURGERY Bilateral    LASIK   TOTAL HIP ARTHROPLASTY Left 07/24/2014   Procedure: CONVERSION OF PREVIOUS HIP SURGERY LEFT HIP ARTHROPLASTY;  Surgeon: [name], MD;  Location: [facility];  Service: Orthopedics;  Laterality: Left;   TOTAL KNEE ARTHROPLASTY Right 02/15/2019   Procedure: TOTAL KNEE ARTHROPLASTY;  Surgeon: Durene Romanslin, Matthew, MD;  Location: [facility];  Service: Orthopedics;  Laterality: Right;  70 mins   Family History  Problem Relation Age of Onset   Hypertension Mother    Diabetes Mother    Hypertension Father    Heart attack Father    Diabetes Sister    Colon cancer  Neg Hx    Inflammatory bowel disease Neg Hx    Social History   Socioeconomic History   Marital status: Married    Spouse name: Not on file   Number of children: Not on file   Years of education: Not on file   Highest education level: Not on file  Occupational History   Not on file  Tobacco Use   Smoking status: Former    Pack years: 0.00    Types: Pipe    Quit date: 1963    Years since quitting: 59.5   Smokeless tobacco: Never   Tobacco comments:    quit 40-50 years ago  Vaping Use   Vaping Use: Never used  Substance and Sexual Activity   Alcohol use: No    Comment: QUIT SMOKING AGE 82   Drug use: No   Sexual activity: Not on file  Other Topics Concern   Not on file  Social History Narrative   Not on file   Social Determinants of Health   Financial Resource Strain: Low Risk    Difficulty of Paying Living Expenses: Not hard at all  Food Insecurity: No Food Insecurity   Worried About Programme researcher, broadcasting/film/videounning Out of Food in the Last Year: Never true   Ran Out of Food in the Last Year: Never true  Transportation Needs: No Transportation Needs   Lack of Transportation (Medical): No   Lack of Transportation (Non-Medical): No  Physical Activity: Inactive   Days of Exercise per Week: 0 days   Minutes of Exercise per Session: 0 min  Stress: No Stress Concern Present   Feeling of Stress : Not at all  Social Connections: Socially Integrated   Frequency of Communication with Friends and Family: More than three times a week   Frequency of Social Gatherings with Friends and Family: Once a week   Attends Religious Services: More than 4 times per year   Active Member of [employer] Financial[employer]ubs or Organizations: Yes   Attends Engineer, structuralClub or Organization Meetings: More than 4 times per year   Marital Status: Married    Tobacco Counseling Counseling given: Not Answered Tobacco comments: quit 40-50 years ago   Clinical Intake:  Pre-visit preparation completed: Yes  Pain : No/denies pain     Nutritional  Risks: None Diabetes: No  How often do you need to have someone help you when you read instructions, pamphlets, or other written materials from your doctor or pharmacy?: 1 - Never  Diabetic?No   Interpreter Needed?: No  Information entered by :: SCrews,LPN   Activities of Daily Living In your present state of health, do you have any difficulty performing the following activities: 03/12/2021  Hearing? Y  Comment wears bilateral hearing aids  Vision? N  Difficulty concentrating or making decisions? N  Walking or climbing stairs? N  Dressing or bathing? N  Doing errands, shopping? N  Preparing Food and eating ? N  Using the Toilet? N  In the past six months, have you accidently leaked urine? N  Do you have problems with loss of bowel control? N  Managing your Medications? N  Managing your Finances? N  Housekeeping or managing your Housekeeping? N  Some recent data might be hidden    Patient Care Team: Annalee Genta, DO as PCP - General (Family Medicine) Jonelle Sidle, MD as PCP - Cardiology (Cardiology) Jena Gauss Gerrit Friends, MD as Consulting Physician (Gastroenterology)  Indicate any recent Medical Services you may have received from other than [facility] providers in the past year (date may be approximate).     Assessment:   This is a routine wellness examination for Yehia.  Hearing/Vision screen Vision Screening - Comments:: Patient stated he gets eyes examined once per year. Has hx of recent cataract surgery from bilateral eyes   Dietary issues and exercise activities discussed: Current Exercise Habits: The patient has a physically strenuous job, but has no regular exercise apart from work., Exercise limited by: None identified   Goals Addressed             This Visit's Progress    Weight (lb) < 200 lb (90.7 kg)         Depression Screen PHQ 2/9 Scores 03/12/2021 12/12/2020 12/05/2019 06/03/2018 06/01/2017 06/03/2015  PHQ - 2 Score 0 0 0 0 0 0    Fall Risk Fall  Risk  03/12/2021 12/12/2020 06/14/2020 12/05/2019 06/03/2018  Falls in the past year? 0 0 0 0 No  Number falls in past yr: 0 - 0 - -  Injury with Fall? 0 - 0 - -  Risk for fall due to : No Fall Risks - - - -  Follow up Falls evaluation completed;Falls prevention discussed Falls evaluation completed Falls evaluation completed Falls evaluation completed -    FALL RISK PREVENTION PERTAINING TO THE HOME:  Any stairs in or around the home? Yes  If so, are there any without handrails? No  Home free of loose throw rugs in walkways, pet beds, electrical cords, etc? Yes  Adequate lighting in your home to reduce risk of falls? Yes   ASSISTIVE DEVICES UTILIZED TO PREVENT FALLS:  Life alert? No  Use of a cane, walker or w/c? No  Grab bars in the bathroom? No  Shower chair or bench in shower? No  Elevated toilet seat or a handicapped toilet? No    Cognitive Function:  Normal cognitive status assessed by direct observation by this Nurse Health Advisor. No abnormalities found.        Immunizations Immunization History  Administered Date(s) Administered   Influenza, High Dose Seasonal PF 06/24/2019   Influenza,inj,Quad PF,6+ Mos 05/28/2016, 06/01/2017, 06/03/2018   Influenza-Unspecified 07/13/2014, 07/16/2015, 06/06/2020   Moderna SARS-COV2 Booster Vaccination 06/27/2020   Moderna Sars-Covid-2 Vaccination 12/22/2019, 01/21/2020   Pneumococcal Conjugate-13 08/07/2015   Pneumococcal Polysaccharide-23 07/23/2012, 07/16/2015   Zoster, Live 08/14/2008    TDAP status: Due, Education has been provided regarding the importance of this vaccine. Advised may receive this vaccine at local pharmacy or Health Dept. Aware to provide a copy of the vaccination record if obtained from local pharmacy or Health Dept. Verbalized acceptance and understanding.  Flu Vaccine status: Up to date  Pneumococcal vaccine status: Up to date  Covid-19 vaccine status: Completed vaccines  Qualifies for Shingles Vaccine?  Yes   Zostavax completed Yes   Shingrix Completed?: No.    Education has been provided regarding the importance of this vaccine. Patient has been advised to call insurance company to determine out of pocket expense if they have not yet received this vaccine. Advised may also receive vaccine at local  pharmacy or Health Dept. Verbalized acceptance and understanding.  Screening Tests Health Maintenance  Topic Date Due   FOOT EXAM  Never done   OPHTHALMOLOGY EXAM  Never done   Hepatitis C Screening  Never done   TETANUS/TDAP  Never done   Zoster Vaccines- Shingrix (1 of 2) Never done   COVID-19 Vaccine (4 - Booster for Moderna series) 09/27/2020   HEMOGLOBIN A1C  11/19/2020   INFLUENZA VACCINE  04/22/2021   PNA vac Low Risk Adult  Completed   HPV VACCINES  Aged Out    Health Maintenance  Health Maintenance Due  Topic Date Due   FOOT EXAM  Never done   OPHTHALMOLOGY EXAM  Never done   Hepatitis C Screening  Never done   TETANUS/TDAP  Never done   Zoster Vaccines- Shingrix (1 of 2) Never done   COVID-19 Vaccine (4 - Booster for Moderna series) 09/27/2020   HEMOGLOBIN A1C  11/19/2020    Colorectal cancer screening: No longer required.   Lung Cancer Screening: (Low Dose CT Chest recommended if Age 26-80 years, 30 pack-year currently smoking OR have quit w/in 15years.) does not qualify.   Lung Cancer Screening Referral: N/A   Additional Screening:  Hepatitis C Screening: does qualify;   Vision Screening: Recommended annual ophthalmology exams for early detection of glaucoma and other disorders of the eye. Is the patient up to date with their annual eye exam?  Yes  Who is the provider or what is the name of the office in which the patient attends annual eye exams? [facility]  If pt is not established with a provider, would they like to be referred to a provider to establish care? No .   Dental Screening: Recommended annual dental exams for proper oral hygiene  Community  Resource Referral / Chronic Care Management: CRR required this visit?  No   CCM required this visit?  No      Plan:     I have personally reviewed and noted the following in the patient's chart:   Medical and social history Use of alcohol, tobacco or illicit drugs  Current medications and supplements including opioid prescriptions. Patient is not currently taking opioid prescriptions. Functional ability and status Nutritional status Physical activity Advanced directives List of other physicians Hospitalizations, surgeries, and ER visits in previous 12 months Vitals Screenings to include cognitive, depression, and falls Referrals and appointments  In addition, I have reviewed and discussed with patient certain preventive protocols, quality metrics, and best practice recommendations. A written personalized care plan for preventive services as well as general preventive health recommendations were provided to patient.     Theodora Blow, LPN   [id number]/[id number]   Nurse Notes: None   I have reviewed and agree with the above AWV documentation.   Malena Ladona Ridgel, DO  [facility]   MALENA Orie Fisherman, DO 03/21/2021 8:31 AM

## 2021-03-14 ENCOUNTER — Ambulatory Visit (INDEPENDENT_AMBULATORY_CARE_PROVIDER_SITE_OTHER): Payer: Medicare HMO | Admitting: Family Medicine

## 2021-03-14 ENCOUNTER — Other Ambulatory Visit: Payer: Self-pay

## 2021-03-14 VITALS — BP 124/68 | HR 43 | Temp 97.3°F | Wt 213.4 lb

## 2021-03-14 DIAGNOSIS — R001 Bradycardia, unspecified: Secondary | ICD-10-CM | POA: Diagnosis not present

## 2021-03-14 DIAGNOSIS — B354 Tinea corporis: Secondary | ICD-10-CM

## 2021-03-14 MED ORDER — KETOCONAZOLE 2 % EX CREA: 1.0000 "application " | TOPICAL_CREAM | Freq: Two times a day (BID) | CUTANEOUS | 0 refills | Status: DC

## 2021-03-14 NOTE — Patient Instructions (Signed)
Body Ringworm Body ringworm is an infection of the skin that often causes a ring-shaped rash. Body ringworm is also called tinea corporis. Body ringworm can affect any part of your skin. This condition is easily spread from person to person (is very contagious). What are the causes? This condition is caused by fungi called dermatophytes. The condition develops when these fungi grow out of control on the skin. You can get this condition if you touch a person or animal that has it. You can also get it if you share any items with an infected person or pet. These include: Clothing, bedding, and towels. Brushes or combs. Gym equipment. Any other object that has the fungus on it. What increases the risk? You are more likely to develop this condition if you: Play sports that involve close physical contact, such as wrestling. Sweat a lot. Live in areas that are hot and humid. Use public showers. Have a weakened immune system. What are the signs or symptoms? Symptoms of this condition include: Itchy, raised red spots and bumps. Red scaly patches. A ring-shaped rash. The rash may have: A clear center. Scales or red bumps at its center. Redness near its borders. Dry and scaly skin on or around it. How is this diagnosed? This condition can usually be diagnosed with a skin exam. A skin scraping may be taken from the affected area and examined under a microscope to see if the fungus is present. How is this treated? This condition may be treated with: An antifungal cream or ointment. An antifungal shampoo. Antifungal medicines. These may be prescribed if your ringworm: Is severe. Keeps coming back. Lasts a long time. Follow these instructions at home: Take over-the-counter and prescription medicines only as told by your health care provider. If you were given an antifungal cream or ointment: Use it as told by your health care provider. Wash the infected area and dry it completely before  applying the cream or ointment. If you were given an antifungal shampoo: Use it as told by your health care provider. Leave the shampoo on your body for 3-5 minutes before rinsing. While you have a rash: Wear loose clothing to stop clothes from rubbing and irritating it. Wash or change your bed sheets every night. Disinfect or throw out items that may be infected. Wash clothes and bed sheets in hot water. Wash your hands often with soap and water. If soap and water are not available, use hand sanitizer. If your pet has the same infection, take your pet to see a veterinarian for treatment. How is this prevented? Take a bath or shower every day and after every time you work out or play sports. Dry your skin completely after bathing. Wear sandals or shoes in public places and showers. Change your clothes every day. Wash athletic clothes after each use. Do not share personal items with others. Avoid touching red patches of skin on other people. Avoid touching pets that have bald spots. If you touch an animal that has a bald spot, wash your hands. Contact a health care provider if: Your rash continues to spread after 7 days of treatment. Your rash is not gone in 4 weeks. The area around your rash gets red, warm, tender, and swollen. Summary Body ringworm is an infection of the skin that often causes a ring-shaped rash. This condition is easily spread from person to person (is very contagious). This condition may be treated with antifungal cream or ointment, antifungal shampoo, or antifungal medicines. Take over-the-counter and   prescription medicines only as told by your health care provider. This information is not intended to replace advice given to you by your health care provider. Make sure you discuss any questions you have with your health care provider. Document Revised: 05/07/2018 Document Reviewed: 05/07/2018 Elsevier Patient Education  2022 Elsevier Inc.  

## 2021-03-14 NOTE — Progress Notes (Signed)
     Patient ID: Hector [name], male    DOB: 1941-02-11, 80 y.o.   MRN: [id number]   Chief Complaint  Patient presents with   Rash   Subjective:    HPI Pt has rash across top of shoulders. This has occurred in the past and pt was prescribed a cream. Heat makes it worse. Pt has been using Cortisone with no relief. When rash gets dry it will itch.   Had this 3 yrs ago.  Had this and gave an ointment by last pcp and it resolved.  Heat makes it worse.  Cortisone otc not helping.  Center of back upper back.  Very itching on the upper back.  No fever, blisters.   No new lotions, detergents.   Medical History Rubert has a past medical history of Chronic hip pain, ED (erectile dysfunction), Essential hypertension, Hyperlipidemia, IFG (impaired fasting glucose), Noncompliance, Obesity, and Osteoarthritis.   Outpatient Encounter Medications as of 03/14/2021  Medication Sig   amLODipine (NORVASC) 10 MG tablet TAKE 1 TABLET EVERY DAY   benazepril (LOTENSIN) 40 MG tablet TAKE 1 TABLET TWICE DAILY   carvedilol (COREG) 12.5 MG tablet TAKE 1 TABLET TWICE DAILY   cloNIDine (CATAPRES) 0.1 MG tablet TAKE 1 TABLET TWICE DAILY   gemfibrozil (LOPID) 600 MG tablet TAKE 1 TABLET TWICE DAILY BEFORE MEALS   ketoconazole (NIZORAL) 2 % cream Apply 1 application topically 2 (two) times daily. For 2 weeks for back rash.   spironolactone (ALDACTONE) 25 MG tablet TAKE 1 TABLET EVERY DAY   No facility-administered encounter medications on file as of 03/14/2021.     Review of Systems  Constitutional:  Negative for chills and fever.  HENT:  Negative for congestion, rhinorrhea and sore throat.   Respiratory:  Negative for cough, shortness of breath and wheezing.   Cardiovascular:  Negative for chest pain and leg swelling.  Gastrointestinal:  Negative for abdominal pain, diarrhea, nausea and vomiting.  Genitourinary:  Negative for dysuria and frequency.  Skin:  Positive for rash.  Neurological:   Negative for dizziness, weakness and headaches.    Vitals BP 124/68   Pulse (!) 43   Temp (!) 97.3 F (36.3 C)   Wt 213 lb 6.4 oz (96.8 kg)   SpO2 97%   BMI 29.76 kg/m   Objective:   Physical Exam Cardiovascular:     Rate and Rhythm: Bradycardia present.  Musculoskeletal:        General: Normal range of motion.  Skin:    General: Skin is warm and dry.     Findings: Rash (rash on upper back and neck erythematous rash in circular pattern with raised outer ede with central clearing) present.  Neurological:     General: No focal deficit present.     Mental Status: He is oriented to person, place, and time.     Cranial Nerves: No cranial nerve deficit.     Assessment and Plan   1. Tinea corporis - ketoconazole (NIZORAL) 2 % cream; Apply 1 application topically 2 (two) times daily. For 2 weeks for back rash.  Dispense: 60 g; Refill: 0  2. Bradycardia   Pt to f/u with cardiology about bradycardia, pt running in 40's asymptomatic.  Tinea corporis- on upper back.  Use ketoconazole for 2-3 wks, call if not improving.  Return if symptoms worsen or fail to improve.

## 2021-03-28 ENCOUNTER — Telehealth: Payer: Self-pay | Admitting: Family Medicine

## 2021-03-28 ENCOUNTER — Other Ambulatory Visit: Payer: Self-pay | Admitting: *Deleted

## 2021-03-28 DIAGNOSIS — D649 Anemia, unspecified: Secondary | ICD-10-CM

## 2021-03-28 NOTE — Telephone Encounter (Signed)
Called and informed patient per drs recommendations, he is feeling fine, but he states he will call his cardiologist to get an appt for evaluation of his low heart  rates.

## 2021-04-15 ENCOUNTER — Other Ambulatory Visit: Payer: Self-pay | Admitting: Cardiology

## 2021-04-16 DIAGNOSIS — D649 Anemia, unspecified: Secondary | ICD-10-CM | POA: Diagnosis not present

## 2021-04-17 LAB — IRON,TIBC AND FERRITIN PANEL
Ferritin: 587 ng/mL — ABNORMAL HIGH (ref 30–400)
Iron Saturation: 48 % (ref 15–55)
Iron: 169 ug/dL (ref 38–169)
Total Iron Binding Capacity: 351 ug/dL (ref 250–450)
UIBC: 182 ug/dL (ref 111–343)

## 2021-04-17 LAB — CBC WITH DIFFERENTIAL/PLATELET
Basophils Absolute: 0.1 10*3/uL (ref 0.0–0.2)
Basos: 1 %
EOS (ABSOLUTE): 1.5 10*3/uL — ABNORMAL HIGH (ref 0.0–0.4)
Eos: 20 %
Hematocrit: 34.5 % — ABNORMAL LOW (ref 37.5–51.0)
Hemoglobin: 11.9 g/dL — ABNORMAL LOW (ref 13.0–17.7)
Immature Grans (Abs): 0.1 10*3/uL (ref 0.0–0.1)
Immature Granulocytes: 1 %
Lymphocytes Absolute: 1.8 10*3/uL (ref 0.7–3.1)
Lymphs: 23 %
MCH: 31.2 pg (ref 26.6–33.0)
MCHC: 34.5 g/dL (ref 31.5–35.7)
MCV: 90 fL (ref 79–97)
Monocytes Absolute: 0.8 10*3/uL (ref 0.1–0.9)
Monocytes: 11 %
Neutrophils Absolute: 3.5 10*3/uL (ref 1.4–7.0)
Neutrophils: 44 %
Platelets: 273 10*3/uL (ref 150–450)
RBC: 3.82 x10E6/uL — ABNORMAL LOW (ref 4.14–5.80)
RDW: 16 % — ABNORMAL HIGH (ref 11.6–15.4)
WBC: 7.8 10*3/uL (ref 3.4–10.8)

## 2021-04-28 NOTE — Progress Notes (Signed)
Referring Provider: Annalee Genta, DO Primary Care Physician:  Annalee Genta, DO Primary GI Physician: Dr. Jena Gauss  Chief Complaint  Patient presents with   Anemia    Stool dark if takes iron    HPI:   Hector [name] is a 80 y.o. male presenting today for follow-up.  History of normocytic anemia dating back to May 2020, B12 and folate normal, started iron around November 2021,  change in bowel habits in early 2021 to frequent loose stools after several blood pressure medication changes.  Celiac screen negative, TSH normal.  Last colonoscopy June 2016 with multiple anal papilla, normal-appearing rectal mucosa, scattered left-sided diverticula.  No recommendations to repeat colonoscopy.  Last seen in our office May 2022.  BMs much improved with 1-2 [place] 4-5 BMs daily.  He reduced iron for 3 to 4 days a week rather than daily.  No other changes.  No alarm symptoms.  No significant upper GI symptoms.  Denied NSAIDs.  Also noted bradycardia with pulse 44, asymptomatic.  Discussed possibility of EGD and colonoscopy in light of anemia.  Patient preferred to hold off.  Recommend stopping oral iron completely and monitoring CBC and iron panel.  If declining hemoglobin or develops iron deficiency, consider EGD and colonoscopy at that time.  Follow-up in 3 months with repeat lab work 1 week prior to office visit.  Recommended follow-up with cardiology for bradycardia.  Follow-up appointment with cardiology scheduled for 8/15.  Labs completed 04/16/2021.  Hemoglobin fairly stable at 11.9 with normocytic indices (hemoglobin 12.23 October 2020).  Iron 169, iron saturation 48%, ferritin elevated at 587.   Today:  Has taken iron 2-3 days since May 2022. Eats liver. No brbpr. Stools are dark only if taking iron. No constipation or diarrhea. No abdominal pain, nausea, or vomiting. Occasional acid reflux that is well managed with tums PRN. No dysphagia.   Ibuprofen as needed. Once a week or less.    Has appt with Dr. Diona Browner Friday to follow-up on bradycardia.  He remains asymptomatic.   Past Medical History:  Diagnosis Date   Anemia    normocytic   Chronic hip pain    Left hip pain   ED (erectile dysfunction)    Essential hypertension    Onset age 55   Hyperlipidemia    IFG (impaired fasting glucose)    Noncompliance    Obesity    Osteoarthritis     Past Surgical History:  Procedure Laterality Date   BILATERAL KNEE ARTHROSCOPY     CHOLECYSTECTOMY  2010   COLONOSCOPY N/A 03/21/2015   Surgeon: Corbin Ade, MD;  Multiple anal papilla-1 prominent, normal-appearing rectal mucosa, scattered left-sided diverticula, the remainder of the exam was normal.  No recommendations to repeat colonoscopy.   HIP FRACTURE SURGERY  2009   LEFT HIP   KNEE ARTHROTOMY BILATERAL 50 + YRS AGO     REFRACTIVE SURGERY Bilateral    LASIK   TOTAL HIP ARTHROPLASTY Left 07/24/2014   Procedure: CONVERSION OF PREVIOUS HIP SURGERY LEFT HIP ARTHROPLASTY;  Surgeon: [name], MD;  Location: [facility];  Service: Orthopedics;  Laterality: Left;   TOTAL KNEE ARTHROPLASTY Right 02/15/2019   Procedure: TOTAL KNEE ARTHROPLASTY;  Surgeon: Durene Romans, MD;  Location: [facility];  Service: Orthopedics;  Laterality: Right;  70 mins    Current Outpatient Medications  Medication Sig Dispense Refill   amLODipine (NORVASC) 10 MG tablet TAKE 1 TABLET EVERY DAY 90 tablet 1   benazepril (LOTENSIN)  40 MG tablet TAKE 1 TABLET TWICE DAILY 180 tablet 3   calcium carbonate (TUMS - DOSED IN MG ELEMENTAL CALCIUM) 500 MG chewable tablet Chew 1 tablet by mouth as needed for indigestion or heartburn.     carvedilol (COREG) 12.5 MG tablet TAKE 1 TABLET TWICE DAILY 180 tablet 3   cloNIDine (CATAPRES) 0.1 MG tablet TAKE 1 TABLET TWICE DAILY 120 tablet 0   gemfibrozil (LOPID) 600 MG tablet TAKE 1 TABLET TWICE DAILY BEFORE MEALS 180 tablet 1   spironolactone (ALDACTONE) 25 MG tablet TAKE 1 TABLET EVERY DAY 90 tablet 3   No  current facility-administered medications for this visit.    Allergies as of 04/29/2021 - Review Complete 04/29/2021  Allergen Reaction Noted   Hctz [hydrochlorothiazide]  01/07/2013   Lipitor [atorvastatin]  01/07/2013    Family History  Problem Relation Age of Onset   Hypertension Mother    Diabetes Mother    Hypertension Father    Heart attack Father    Diabetes Sister    Colon cancer Neg Hx    Inflammatory bowel disease Neg Hx     Social History   Socioeconomic History   Marital status: Married    Spouse name: Not on file   Number of children: Not on file   Years of education: Not on file   Highest education level: Not on file  Occupational History   Not on file  Tobacco Use   Smoking status: Former    Types: Pipe    Quit date: 1963    Years since quitting: 59.6   Smokeless tobacco: Never   Tobacco comments:    quit 40-50 years ago  Vaping Use   Vaping Use: Never used  Substance and Sexual Activity   Alcohol use: No    Comment: QUIT SMOKING AGE 64   Drug use: No   Sexual activity: Not on file  Other Topics Concern   Not on file  Social History Narrative   Not on file   Social Determinants of Health   Financial Resource Strain: Low Risk    Difficulty of Paying Living Expenses: Not hard at all  Food Insecurity: No Food Insecurity   Worried About Programme researcher, broadcasting/film/video in the Last Year: Never true   Ran Out of Food in the Last Year: Never true  Transportation Needs: No Transportation Needs   Lack of Transportation (Medical): No   Lack of Transportation (Non-Medical): No  Physical Activity: Inactive   Days of Exercise per Week: 0 days   Minutes of Exercise per Session: 0 min  Stress: No Stress Concern Present   Feeling of Stress : Not at all  Social Connections: Socially Integrated   Frequency of Communication with Friends and Family: More than three times a week   Frequency of Social Gatherings with Friends and Family: Once a week   Attends Religious  Services: More than 4 times per year   Active Member of [employer] or Organizations: Yes   Attends Engineer, structural: More than 4 times per year   Marital Status: Married    Review of Systems: Gen: Denies fever, chills, cold or flulike symptoms, presyncope, syncope. CV: Denies chest pain, palpitations Resp: Denies dyspnea or cough GI: See HPI  Heme: See HPI  Physical Exam: BP (!) 182/67   Pulse (!) 46   Temp (!) 97.5 F (36.4 C) (Temporal)   Ht 5\' 11"  (1.803 m)   Wt 211 lb 3.2 oz (95.8 kg)  BMI 29.46 kg/m  General:   Alert and oriented. No distress noted. Pleasant and cooperative.  Head:  Normocephalic and atraumatic. Eyes:  Conjuctiva clear without scleral icterus. Heart:  S1, S2 present without murmurs appreciated.  Bradycardic Lungs:  Clear to auscultation bilaterally. No wheezes, rales, or rhonchi. No distress.  Abdomen:  +BS, soft, non-tender and non-distended. No rebound or guarding. No HSM or masses noted. Msk:  Symmetrical without gross deformities. Normal posture. Extremities:  Without edema. Neurologic:  Alert and  oriented x4 Psych: Normal mood and affect.    Assessment: 80 year old male presenting today for follow-up of normocytic anemia which dates back to May 2020 with hemoglobin in the upper 11/low 12 range.  Vitamin B12 and folate within normal limits.  Iron panel was also within normal limits.  He started oral iron empirically around November 2021, but this was discontinued in May 2022 per my recommendation as he does not have iron deficiency.  Celiac screen negative.  Previously with mild elevation of creatinine in May 2020 and again in August 2021, but most recent creatinine within normal limits at 1.07.  Last colonoscopy in 2016 with multiple anal papilla, normal-appearing rectal mucosa, scattered left-sided diverticula.  No recommendations to repeat colonoscopy.  No prior EGD. He denies overt GI bleeding or any other obvious blood loss.  No regular  NSAID use.  Most recent labs 04/16/21 with hemoglobin stable at 11.9.  Iron 169, iron saturation 48%, ferritin 587 (H).  Etiology of mild anemia is not clear.  Had discussed the possibility of first-ever EGD and repeating a colonoscopy to ensure no contributing GI source, but patient declined.  Overall, GI source seems less likely as he does not have iron deficiency.  Unclear if oral iron may have been covering an underlying iron deficiency.  Cannot rule out other hematologic abnormality.  We will continue to monitor for now.     Plan: Continue to hold oral iron. Repeat CBC and iron panel in 3 months.  If he develops iron deficiency, would consider EGD and colonoscopy at that time. Avoid NSAIDs. Patient is to monitor for BRBPR or melena and let us know if this occurs. Follow-up in 6 months.  Consider referral to hematology.     Ermalinda Memos, PA-C [facility] 04/29/2021

## 2021-04-29 ENCOUNTER — Encounter: Payer: Self-pay | Admitting: Gastroenterology

## 2021-04-29 ENCOUNTER — Other Ambulatory Visit: Payer: Self-pay

## 2021-04-29 ENCOUNTER — Ambulatory Visit: Payer: Medicare HMO | Admitting: Gastroenterology

## 2021-04-29 ENCOUNTER — Other Ambulatory Visit: Payer: Self-pay | Admitting: *Deleted

## 2021-04-29 VITALS — BP 182/67 | HR 46 | Temp 97.5°F | Ht 71.0 in | Wt 211.2 lb

## 2021-04-29 DIAGNOSIS — D649 Anemia, unspecified: Secondary | ICD-10-CM | POA: Diagnosis not present

## 2021-04-29 NOTE — Patient Instructions (Signed)
Continue to hold iron.  We will arrange for you to have repeat blood work in about 3 months.  Monitor for bright red blood per rectum or black stools and let us know if this occurs.  Avoid NSAIDs as much as possible including ibuprofen, Aleve, Advil, Goody powders, BC powders, and anything that says "NSAID" on the package.  Plan to follow-up in our office in 6 months.   It was nice to see you today!  I'm glad you are doing well!  Ermalinda Memos, PA-C [facility]

## 2021-05-05 ENCOUNTER — Encounter: Payer: Self-pay | Admitting: Cardiology

## 2021-05-05 NOTE — Progress Notes (Signed)
Cardiology Office Note  Date: 05/06/2021   ID: Hector [name], DOB 1941/09/21, MRN [id number]  PCP:  Annalee Genta, DO  Cardiologist:  Nona Dell, MD Electrophysiologist:  None   Chief Complaint  Patient presents with   Cardiac follow-up     History of Present Illness: Hector [name] is a 80 y.o. male last seen in March 2021.  He is here for a follow-up visit.  Reports no active angina symptoms, no dizziness or syncope, no unusual fatigue with activity.  He has been chronically bradycardic in the setting of conduction system disease (prolonged PR interval and right bundle branch block) and also current medications including clonidine and Coreg.  I personally reviewed his ECG today which shows sinus bradycardia at 45 bpm with right bundle branch block and prolonged PR interval.  He was referred to the hypertension clinic after last visit, although I do not see that this occurred.  Blood pressure trend is better today.  He states that he has been taking his medications regularly.  Past Medical History:  Diagnosis Date   Chronic hip pain    Left hip pain   ED (erectile dysfunction)    Essential hypertension    Hyperlipidemia    IFG (impaired fasting glucose)    Noncompliance    Normocytic anemia    Obesity    Osteoarthritis     Past Surgical History:  Procedure Laterality Date   BILATERAL KNEE ARTHROSCOPY     CHOLECYSTECTOMY  2010   COLONOSCOPY N/A 03/21/2015   Surgeon: Corbin Ade, MD;  Multiple anal papilla-1 prominent, normal-appearing rectal mucosa, scattered left-sided diverticula, the remainder of the exam was normal.  No recommendations to repeat colonoscopy.   HIP FRACTURE SURGERY  2009   LEFT HIP   KNEE ARTHROTOMY BILATERAL 50 + YRS AGO     REFRACTIVE SURGERY Bilateral    LASIK   TOTAL HIP ARTHROPLASTY Left 07/24/2014   Procedure: CONVERSION OF PREVIOUS HIP SURGERY LEFT HIP ARTHROPLASTY;  Surgeon: [name], MD;  Location: [facility];  Service:  Orthopedics;  Laterality: Left;   TOTAL KNEE ARTHROPLASTY Right 02/15/2019   Procedure: TOTAL KNEE ARTHROPLASTY;  Surgeon: Durene Romans, MD;  Location: [facility];  Service: Orthopedics;  Laterality: Right;  70 mins    Current Outpatient Medications  Medication Sig Dispense Refill   amLODipine (NORVASC) 10 MG tablet TAKE 1 TABLET EVERY DAY 90 tablet 1   benazepril (LOTENSIN) 40 MG tablet TAKE 1 TABLET TWICE DAILY 180 tablet 3   calcium carbonate (TUMS - DOSED IN MG ELEMENTAL CALCIUM) 500 MG chewable tablet Chew 1 tablet by mouth as needed for indigestion or heartburn.     carvedilol (COREG) 12.5 MG tablet TAKE 1 TABLET TWICE DAILY 180 tablet 3   cloNIDine (CATAPRES) 0.1 MG tablet TAKE 1 TABLET TWICE DAILY 120 tablet 0   gemfibrozil (LOPID) 600 MG tablet TAKE 1 TABLET TWICE DAILY BEFORE MEALS 180 tablet 1   spironolactone (ALDACTONE) 25 MG tablet TAKE 1 TABLET EVERY DAY 90 tablet 3   No current facility-administered medications for this visit.   Allergies:  Hctz [hydrochlorothiazide] and Lipitor [atorvastatin]   ROS: No syncope.  Physical Exam: VS:  BP (!) 158/90   Pulse (!) 45   Ht 5\' 11"  (1.803 m)   Wt 212 lb 3.2 oz (96.3 kg)   SpO2 94%   BMI 29.60 kg/m , BMI Body mass index is 29.6 kg/m.  Wt Readings from Last 3 Encounters:  05/06/21  212 lb 3.2 oz (96.3 kg)  04/29/21 211 lb 3.2 oz (95.8 kg)  03/14/21 213 lb 6.4 oz (96.8 kg)    General: Patient appears comfortable at rest. HEENT: Conjunctiva and lids normal, wearing a mask. Neck: Supple, no elevated JVP or carotid bruits. Lungs: Clear to auscultation, nonlabored breathing at rest. Cardiac: Regular rate and rhythm, no S3 or significant systolic murmur, no pericardial rub. Extremities: No pitting edema.  ECG:  An ECG dated 10/28/2019 was personally reviewed today and demonstrated:  Sinus bradycardia with prolonged PR interval and right bundle branch block.  Recent Labwork: 05/22/2020: ALT 23; AST 20 10/26/2020: BUN 13;  Creatinine, Ser 1.07; Potassium 4.0; Sodium 142; TSH 2.590 04/16/2021: Hemoglobin 11.9; Platelets 273     Component Value Date/Time   CHOL 107 05/22/2020 0830   TRIG 81 05/22/2020 0830   HDL 32 (L) 05/22/2020 0830   CHOLHDL 3.3 05/22/2020 0830   CHOLHDL 4.3 10/31/2014 0944   VLDL 35 10/31/2014 0944   LDLCALC 59 05/22/2020 0830    Other Studies Reviewed Today:  Echocardiogram 10/21/2019:  1. Left ventricular ejection fraction, by visual estimation, is 65 to  70%. The left ventricle has hyperdynamic function. There is moderately  increased left ventricular hypertrophy.   2. Elevated left ventricular end-diastolic pressure.   3. Left ventricular diastolic parameters are consistent with Grade I  diastolic dysfunction (impaired relaxation).   4. The left ventricle has no regional wall motion abnormalities.   5. Global right ventricle has normal systolic function.The right  ventricular size is normal. No increase in right ventricular wall  thickness.   6. Left atrial size was mildly dilated.   7. Right atrial size was normal.   8. The mitral valve is grossly normal. Mild mitral valve regurgitation.   9. The tricuspid valve is grossly normal.  10. The tricuspid valve is grossly normal. Tricuspid valve regurgitation  is trivial.  11. The aortic valve is tricuspid. Aortic valve regurgitation is not  visualized. Mild aortic valve sclerosis without stenosis.  12. The pulmonic valve was grossly normal. Pulmonic valve regurgitation is  not visualized.  13. The inferior vena cava is dilated in size with >50% respiratory  variability, suggesting right atrial pressure of 8 mmHg.   Assessment and Plan:  1.  Bradycardia in the setting of conduction system disease with right bundle branch block and prolonged PR interval, also on medical therapy for hypertension including Coreg and clonidine.  He does not describe any obvious associated symptomatology.  We discussed either observation or next step  cutting Coreg in half, although we would likely have to initiate another antihypertensive that does not affect heart rate such as hydralazine.  For now he was comfortable with observation.  2.  Essential hypertension on multimodal therapy.  No changes to present regimen today.  Medication Adjustments/Labs and Tests Ordered: Current medicines are reviewed at length with the patient today.  Concerns regarding medicines are outlined above.   Tests Ordered: Orders Placed This Encounter  Procedures   EKG 12-Lead     Medication Changes: No orders of the defined types were placed in this encounter.   Disposition:  Follow up  3 months.  Signed, Jonelle Sidle, MD, [facility] 05/06/2021 8:54 AM    [facility] at [facility] [address] [place], [place] [address] Phone: [phone]; Fax: [phone]

## 2021-05-06 ENCOUNTER — Encounter: Payer: Self-pay | Admitting: Cardiology

## 2021-05-06 ENCOUNTER — Other Ambulatory Visit: Payer: Self-pay

## 2021-05-06 ENCOUNTER — Ambulatory Visit: Payer: Medicare HMO | Admitting: Cardiology

## 2021-05-06 VITALS — BP 158/90 | HR 45 | Ht 71.0 in | Wt 212.2 lb

## 2021-05-06 DIAGNOSIS — I10 Essential (primary) hypertension: Secondary | ICD-10-CM | POA: Diagnosis not present

## 2021-05-06 DIAGNOSIS — R001 Bradycardia, unspecified: Secondary | ICD-10-CM | POA: Diagnosis not present

## 2021-05-06 NOTE — Patient Instructions (Signed)
Medication Instructions:  Your physician recommends that you continue on your current medications as directed. Please refer to the Current Medication list given to you today.  *If you need a refill on your cardiac medications before your next appointment, please call your pharmacy*   Lab Work: None today  If you have labs (blood work) drawn today and your tests are completely normal, you will receive your results only by: MyChart Message (if you have MyChart) OR A paper copy in the mail If you have any lab test that is abnormal or we need to change your treatment, we will call you to review the results.   Testing/Procedures: None today    Follow-Up: At [facility], you and your health needs are our priority.  As part of our continuing mission to provide you with exceptional heart care, we have created designated Provider Care Teams.  These Care Teams include your primary Cardiologist (physician) and Advanced Practice Providers (APPs -  Physician Assistants and Nurse Practitioners) who all work together to provide you with the care you need, when you need it.  We recommend signing up for the patient portal called "MyChart".  Sign up information is provided on this After Visit Summary.  MyChart is used to connect with patients for Virtual Visits (Telemedicine).  Patients are able to view lab/test results, encounter notes, upcoming appointments, etc.  Non-urgent messages can be sent to your provider as well.   To learn more about what you can do with MyChart, go to [email or web address].    Your next appointment:   3 month(s)  The format for your next appointment:   In Person  Provider:   Samuel McDowell, MD   Other Instructions None   

## 2021-06-24 ENCOUNTER — Other Ambulatory Visit: Payer: Self-pay | Admitting: *Deleted

## 2021-06-24 ENCOUNTER — Encounter: Payer: Self-pay | Admitting: *Deleted

## 2021-06-24 DIAGNOSIS — D649 Anemia, unspecified: Secondary | ICD-10-CM

## 2021-06-28 ENCOUNTER — Other Ambulatory Visit: Payer: Self-pay | Admitting: Cardiology

## 2021-07-02 DIAGNOSIS — H1031 Unspecified acute conjunctivitis, right eye: Secondary | ICD-10-CM | POA: Diagnosis not present

## 2021-07-02 DIAGNOSIS — J069 Acute upper respiratory infection, unspecified: Secondary | ICD-10-CM | POA: Diagnosis not present

## 2021-07-05 DIAGNOSIS — D649 Anemia, unspecified: Secondary | ICD-10-CM | POA: Diagnosis not present

## 2021-07-06 LAB — IRON,TIBC AND FERRITIN PANEL
Ferritin: 658 ng/mL — ABNORMAL HIGH (ref 30–400)
Iron Saturation: 22 % (ref 15–55)
Iron: 71 ug/dL (ref 38–169)
Total Iron Binding Capacity: 324 ug/dL (ref 250–450)
UIBC: 253 ug/dL (ref 111–343)

## 2021-07-10 ENCOUNTER — Encounter: Payer: Self-pay | Admitting: *Deleted

## 2021-07-12 ENCOUNTER — Other Ambulatory Visit: Payer: Self-pay | Admitting: Family Medicine

## 2021-07-12 DIAGNOSIS — E782 Mixed hyperlipidemia: Secondary | ICD-10-CM

## 2021-07-18 ENCOUNTER — Other Ambulatory Visit: Payer: Self-pay | Admitting: *Deleted

## 2021-07-18 ENCOUNTER — Telehealth: Payer: Self-pay | Admitting: *Deleted

## 2021-07-18 DIAGNOSIS — D649 Anemia, unspecified: Secondary | ICD-10-CM

## 2021-07-18 NOTE — Telephone Encounter (Signed)
Noted  

## 2021-07-18 NOTE — Telephone Encounter (Signed)
Spoke to pt. Informed him or results and recommendations.Informed him he needed labs drawn. Pt. Voice understanding.  Labs entered into [employer]

## 2021-07-19 DIAGNOSIS — D649 Anemia, unspecified: Secondary | ICD-10-CM | POA: Diagnosis not present

## 2021-07-20 LAB — CBC WITH DIFFERENTIAL/PLATELET
Basophils Absolute: 0.1 10*3/uL (ref 0.0–0.2)
Basos: 1 %
EOS (ABSOLUTE): 0.7 10*3/uL — ABNORMAL HIGH (ref 0.0–0.4)
Eos: 8 %
Hematocrit: 37.1 % — ABNORMAL LOW (ref 37.5–51.0)
Hemoglobin: 12.9 g/dL — ABNORMAL LOW (ref 13.0–17.7)
Immature Grans (Abs): 0.1 10*3/uL (ref 0.0–0.1)
Immature Granulocytes: 1 %
Lymphocytes Absolute: 1.6 10*3/uL (ref 0.7–3.1)
Lymphs: 18 %
MCH: 31.2 pg (ref 26.6–33.0)
MCHC: 34.8 g/dL (ref 31.5–35.7)
MCV: 90 fL (ref 79–97)
Monocytes Absolute: 0.8 10*3/uL (ref 0.1–0.9)
Monocytes: 9 %
Neutrophils Absolute: 5.7 10*3/uL (ref 1.4–7.0)
Neutrophils: 63 %
Platelets: 304 10*3/uL (ref 150–450)
RBC: 4.13 x10E6/uL — ABNORMAL LOW (ref 4.14–5.80)
RDW: 15.1 % (ref 11.6–15.4)
WBC: 9.1 10*3/uL (ref 3.4–10.8)

## 2021-07-22 DIAGNOSIS — N529 Male erectile dysfunction, unspecified: Secondary | ICD-10-CM | POA: Diagnosis not present

## 2021-07-22 DIAGNOSIS — I10 Essential (primary) hypertension: Secondary | ICD-10-CM | POA: Diagnosis not present

## 2021-07-22 DIAGNOSIS — E663 Overweight: Secondary | ICD-10-CM | POA: Diagnosis not present

## 2021-07-22 DIAGNOSIS — G629 Polyneuropathy, unspecified: Secondary | ICD-10-CM | POA: Diagnosis not present

## 2021-07-22 DIAGNOSIS — D649 Anemia, unspecified: Secondary | ICD-10-CM | POA: Diagnosis not present

## 2021-07-22 DIAGNOSIS — E782 Mixed hyperlipidemia: Secondary | ICD-10-CM | POA: Diagnosis not present

## 2021-07-22 DIAGNOSIS — M1990 Unspecified osteoarthritis, unspecified site: Secondary | ICD-10-CM | POA: Diagnosis not present

## 2021-07-25 ENCOUNTER — Telehealth: Payer: Self-pay | Admitting: Internal Medicine

## 2021-07-25 ENCOUNTER — Other Ambulatory Visit: Payer: Self-pay | Admitting: *Deleted

## 2021-07-25 DIAGNOSIS — D649 Anemia, unspecified: Secondary | ICD-10-CM

## 2021-07-25 DIAGNOSIS — R7989 Other specified abnormal findings of blood chemistry: Secondary | ICD-10-CM

## 2021-07-25 LAB — HEMOCHROMATOSIS DNA-PCR(C282Y,H63D)

## 2021-07-25 NOTE — Telephone Encounter (Signed)
Patient returned call, please call back  

## 2021-07-25 NOTE — Telephone Encounter (Signed)
See previous note

## 2021-07-25 NOTE — Addendum Note (Signed)
Addended by: Armstead Peaks on: 07/25/2021 08:58 AM   Modules accepted: Orders

## 2021-08-01 DIAGNOSIS — R69 Illness, unspecified: Secondary | ICD-10-CM | POA: Diagnosis not present

## 2021-08-11 NOTE — Progress Notes (Signed)
Cardiology Office Note  Date: 08/12/2021   ID: Hector [name], DOB 03/30/1941, MRN [id number]  PCP:  No primary care provider on file.  Cardiologist:  Nona Dell, MD Electrophysiologist:  None   Chief Complaint  Patient presents with   Cardiac follow-up    History of Present Illness: Hector [name] is a 80 y.o. male last seen in August.  He is here for a routine visit.  Reports compliance with current antihypertensive regimen.  He remains bradycardic as before, not clearly symptomatic.  No dizziness or syncope.  He currently does not have a PCP established following the departure of Dr. Ladona Ridgel.  I encouraged him to call Dr. Fletcher Anon office to see if he can get in with another provider there.  He is also following with gastroenterology for evaluation of anemia.  He was found to have a significantly elevated ferritin and was referred for genetic testing which found him to be heterozygous for two hemochromatosis genes, low risk for clinical hemochromatosis however.  He has been referred to see hematology.  No recent imaging of the liver.  Past Medical History:  Diagnosis Date   Chronic hip pain    Left hip pain   ED (erectile dysfunction)    Essential hypertension    Hyperlipidemia    IFG (impaired fasting glucose)    Noncompliance    Normocytic anemia    Obesity    Osteoarthritis     Past Surgical History:  Procedure Laterality Date   BILATERAL KNEE ARTHROSCOPY     CHOLECYSTECTOMY  2010   COLONOSCOPY N/A 03/21/2015   Surgeon: Corbin Ade, MD;  Multiple anal papilla-1 prominent, normal-appearing rectal mucosa, scattered left-sided diverticula, the remainder of the exam was normal.  No recommendations to repeat colonoscopy.   HIP FRACTURE SURGERY  2009   LEFT HIP   KNEE ARTHROTOMY BILATERAL 50 + YRS AGO     REFRACTIVE SURGERY Bilateral    LASIK   TOTAL HIP ARTHROPLASTY Left 07/24/2014   Procedure: CONVERSION OF PREVIOUS HIP SURGERY LEFT HIP ARTHROPLASTY;   Surgeon: [name], MD;  Location: [facility];  Service: Orthopedics;  Laterality: Left;   TOTAL KNEE ARTHROPLASTY Right 02/15/2019   Procedure: TOTAL KNEE ARTHROPLASTY;  Surgeon: Durene Romans, MD;  Location: [facility];  Service: Orthopedics;  Laterality: Right;  70 mins    Current Outpatient Medications  Medication Sig Dispense Refill   amLODipine (NORVASC) 10 MG tablet TAKE 1 TABLET EVERY DAY 90 tablet 1   benazepril (LOTENSIN) 40 MG tablet TAKE 1 TABLET TWICE DAILY 180 tablet 3   calcium carbonate (TUMS - DOSED IN MG ELEMENTAL CALCIUM) 500 MG chewable tablet Chew 1 tablet by mouth as needed for indigestion or heartburn.     carvedilol (COREG) 12.5 MG tablet TAKE 1 TABLET TWICE DAILY 180 tablet 3   cloNIDine (CATAPRES) 0.1 MG tablet TAKE 1 TABLET TWICE DAILY 180 tablet 1   gemfibrozil (LOPID) 600 MG tablet TAKE 1 TABLET TWICE DAILY BEFORE MEALS 180 tablet 1   spironolactone (ALDACTONE) 25 MG tablet TAKE 1 TABLET EVERY DAY 90 tablet 3   No current facility-administered medications for this visit.   Allergies:  Hctz [hydrochlorothiazide] and Lipitor [atorvastatin]   ROS: No orthopnea or PND.  Physical Exam: VS:  BP (!) 142/66   Pulse (!) 48   Ht 5\' 11"  (1.803 m)   Wt 213 lb (96.6 kg)   SpO2 96%   BMI 29.71 kg/m , BMI Body mass index is 29.71  kg/m.  Wt Readings from Last 3 Encounters:  08/12/21 213 lb (96.6 kg)  05/06/21 212 lb 3.2 oz (96.3 kg)  04/29/21 211 lb 3.2 oz (95.8 kg)    General: Patient appears comfortable at rest. HEENT: Conjunctiva and lids normal, wearing a mask. Neck: Supple, no elevated JVP or carotid bruits, no thyromegaly. Lungs: Clear to auscultation, nonlabored breathing at rest. Cardiac: Regular rate and rhythm, no S3 or significant systolic murmur, no pericardial rub. Extremities: No pitting edema.  ECG:  An ECG dated 05/06/2021 was personally reviewed today and demonstrated:  Sinus bradycardia with right bundle branch block and prolonged PR  interval.  Recent Labwork: 10/26/2020: BUN 13; Creatinine, Ser 1.07; Potassium 4.0; Sodium 142; TSH 2.590 07/19/2021: Hemoglobin 12.9; Platelets 304     Component Value Date/Time   CHOL 107 05/22/2020 0830   TRIG 81 05/22/2020 0830   HDL 32 (L) 05/22/2020 0830   CHOLHDL 3.3 05/22/2020 0830   CHOLHDL 4.3 10/31/2014 0944   VLDL 35 10/31/2014 0944   LDLCALC 59 05/22/2020 0830    Other Studies Reviewed Today:  Echocardiogram 10/21/2019:  1. Left ventricular ejection fraction, by visual estimation, is 65 to  70%. The left ventricle has hyperdynamic function. There is moderately  increased left ventricular hypertrophy.   2. Elevated left ventricular end-diastolic pressure.   3. Left ventricular diastolic parameters are consistent with Grade I  diastolic dysfunction (impaired relaxation).   4. The left ventricle has no regional wall motion abnormalities.   5. Global right ventricle has normal systolic function.The right  ventricular size is normal. No increase in right ventricular wall  thickness.   6. Left atrial size was mildly dilated.   7. Right atrial size was normal.   8. The mitral valve is grossly normal. Mild mitral valve regurgitation.   9. The tricuspid valve is grossly normal.  10. The tricuspid valve is grossly normal. Tricuspid valve regurgitation  is trivial.  11. The aortic valve is tricuspid. Aortic valve regurgitation is not  visualized. Mild aortic valve sclerosis without stenosis.  12. The pulmonic valve was grossly normal. Pulmonic valve regurgitation is  not visualized.  13. The inferior vena cava is dilated in size with >50% respiratory  variability, suggesting right atrial pressure of 8 mmHg.   Assessment and Plan:  1.  Essential hypertension.  Plan to continue current regimen for now.  2.  Sinus bradycardia with underlying conduction system disease, not clearly symptomatic at this point.  He is on both Coreg and clonidine for treatment of essential  hypertension.  For now would continue with present dosing although Coreg can be cut in half if he does become symptomatic.  Medication Adjustments/Labs and Tests Ordered: Current medicines are reviewed at length with the patient today.  Concerns regarding medicines are outlined above.   Tests Ordered: No orders of the defined types were placed in this encounter.   Medication Changes: No orders of the defined types were placed in this encounter.   Disposition:  Follow up  6 months.  Signed, Jonelle Sidle, MD, [facility] 08/12/2021 8:37 AM    [facility] at [facility] [address] [place], [place], [place] [address] Phone: [phone]; Fax: [phone]

## 2021-08-12 ENCOUNTER — Ambulatory Visit: Payer: Medicare HMO | Admitting: Cardiology

## 2021-08-12 ENCOUNTER — Encounter: Payer: Self-pay | Admitting: Cardiology

## 2021-08-12 VITALS — BP 142/66 | HR 48 | Ht 71.0 in | Wt 213.0 lb

## 2021-08-12 DIAGNOSIS — R001 Bradycardia, unspecified: Secondary | ICD-10-CM | POA: Diagnosis not present

## 2021-08-12 DIAGNOSIS — I10 Essential (primary) hypertension: Secondary | ICD-10-CM

## 2021-08-12 NOTE — Patient Instructions (Addendum)

## 2021-08-22 NOTE — Progress Notes (Addendum)
[facility] [address], [place] [address]   CLINIC:  Medical Oncology/Hematology  Patient Care Team: Kathyrn Drown, MD as PCP - General (Family Medicine) Satira Sark, MD as PCP - Cardiology (Cardiology) Gala Romney Cristopher Estimable, MD as Consulting Physician (Gastroenterology) Derek Jack, MD as Medical Oncologist (Hematology)  CHIEF COMPLAINTS/PURPOSE OF CONSULTATION:  Evaluation of elevated ferritin and normocytic anemia  HISTORY OF PRESENTING ILLNESS:  CARDER BELOW 80 y.o. male is here because of evaluation of elevated ferritin, at the request of [place]. His last colonoscopy was on 03/21/2015.   Today he reports feeling good. He regularly donates blood until 1 year ago they would not accept his blood due to elevated iron. He denies history of blood transfusion. He denies history of problems involving the liver. He reports soft BM. He denies night sweats, weight loss, loss of appetite, and fevers. He denies family history of hemachromatosis, anemia, and cancer. 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 He denies smoking history.   MEDICAL HISTORY:  Past Medical History:  Diagnosis Date   Chronic hip pain    Left hip pain   ED (erectile dysfunction)    Essential hypertension    Hyperlipidemia    IFG (impaired fasting glucose)    Noncompliance    Normocytic anemia    Obesity    Osteoarthritis     SURGICAL HISTORY: Past Surgical History:  Procedure Laterality Date   BILATERAL KNEE ARTHROSCOPY     CHOLECYSTECTOMY  2010   COLONOSCOPY N/A 03/21/2015   Surgeon: Daneil Dolin, MD;  Multiple anal papilla-1 prominent, normal-appearing rectal mucosa, scattered left-sided diverticula, the remainder of the exam was normal.  No recommendations to repeat colonoscopy.   HIP FRACTURE SURGERY  2009   LEFT HIP   KNEE ARTHROTOMY BILATERAL 50 + YRS AGO     REFRACTIVE SURGERY  Bilateral    LASIK   TOTAL HIP ARTHROPLASTY Left 07/24/2014   Procedure: CONVERSION OF PREVIOUS HIP SURGERY LEFT HIP ARTHROPLASTY;  Surgeon: Mauri Pole, MD;  Location: [facility];  Service: Orthopedics;  Laterality: Left;   TOTAL KNEE ARTHROPLASTY Right 02/15/2019   Procedure: TOTAL KNEE ARTHROPLASTY;  Surgeon: Paralee Cancel, MD;  Location: [facility];  Service: Orthopedics;  Laterality: Right;  70 mins    SOCIAL HISTORY: Social History   Socioeconomic History   Marital status: Married    Spouse name: Not on file   Number of children: Not on file   Years of education: Not on file   Highest education level: Not on file  Occupational History   Not on file  Tobacco Use   Smoking status: Former    Types: Pipe    Quit date: 1963    Years since quitting: 59.9   Smokeless tobacco: Never   Tobacco comments:    quit 40-50 years ago  Vaping Use   Vaping Use: Never used  Substance and Sexual Activity   Alcohol use: No    Comment: QUIT SMOKING AGE 73   Drug use: No   Sexual activity: Not on file  Other Topics Concern   Not on file  Social History Narrative   Not on file   Social Determinants of Health   Financial Resource Strain: Low Risk    Difficulty of Paying Living Expenses: Not hard at all  Food Insecurity: No Food Insecurity   Worried About [place] in the Last  Year: Never true   Ran Out of Food in the Last Year: Never true  Transportation Needs: No Transportation Needs   Lack of Transportation (Medical): No   Lack of Transportation (Non-Medical): No  Physical Activity: Inactive   Days of Exercise per Week: 0 days   Minutes of Exercise per Session: 0 min  Stress: No Stress Concern Present   Feeling of Stress : Not at all  Social Connections: Socially Integrated   Frequency of Communication with Friends and Family: More than three times a week   Frequency of Social Gatherings with Friends and Family: Once a week   Attends Religious Services: More than 4 times  per year   Active Member of [employer] or Organizations: Yes   Attends Engineer, structural: More than 4 times per year   Marital Status: Married  Catering manager Violence: Not At Risk   Fear of Current or Ex-Partner: No   Emotionally Abused: No   Physically Abused: No   Sexually Abused: No    FAMILY HISTORY: Family History  Problem Relation Age of Onset   Hypertension Mother    Diabetes Mother    Hypertension Father    Heart attack Father    Diabetes Sister    Colon cancer Neg Hx    Inflammatory bowel disease Neg Hx     ALLERGIES:  is allergic to lipitor [atorvastatin].  MEDICATIONS:  Current Outpatient Medications  Medication Sig Dispense Refill   amLODipine (NORVASC) 10 MG tablet TAKE 1 TABLET EVERY DAY 90 tablet 1   benazepril (LOTENSIN) 40 MG tablet TAKE 1 TABLET TWICE DAILY 180 tablet 3   calcium carbonate (TUMS - DOSED IN MG ELEMENTAL CALCIUM) 500 MG chewable tablet Chew 1 tablet by mouth as needed for indigestion or heartburn.     carvedilol (COREG) 12.5 MG tablet TAKE 1 TABLET TWICE DAILY 180 tablet 3   cloNIDine (CATAPRES) 0.1 MG tablet TAKE 1 TABLET TWICE DAILY 180 tablet 1   gemfibrozil (LOPID) 600 MG tablet TAKE 1 TABLET TWICE DAILY BEFORE MEALS 180 tablet 1   spironolactone (ALDACTONE) 25 MG tablet TAKE 1 TABLET EVERY DAY 90 tablet 3   No current facility-administered medications for this visit.    REVIEW OF SYSTEMS:   Review of Systems  Constitutional:  Negative for appetite change (75%), fatigue (75%), fever and unexpected weight change.  HENT:   Positive for trouble swallowing (teeth).   All other systems reviewed and are negative.   PHYSICAL EXAMINATION: ECOG PERFORMANCE STATUS: 1 - Symptomatic but completely ambulatory  Vitals:   08/23/21 0813  BP: 136/80  Pulse: (!) 50  Resp: 18  Temp: 98.2 F (36.8 C)  SpO2: 100%   Filed Weights   08/23/21 0813  Weight: 210 lb (95.3 kg)   Physical Exam Vitals reviewed.  Constitutional:       Appearance: Normal appearance.  Cardiovascular:     Rate and Rhythm: Normal rate and regular rhythm.     Pulses: Normal pulses.     Heart sounds: Normal heart sounds.  Pulmonary:     Effort: Pulmonary effort is normal.     Breath sounds: Normal breath sounds.  Musculoskeletal:     Right lower leg: No edema.     Left lower leg: No edema.  Lymphadenopathy:     Cervical: No cervical adenopathy.     Right cervical: No superficial cervical adenopathy.    Left cervical: No superficial cervical adenopathy.     Upper Body:  Right upper body: No supraclavicular, axillary or pectoral adenopathy.     Left upper body: No supraclavicular, axillary or pectoral adenopathy.     Lower Body: No right inguinal adenopathy. No left inguinal adenopathy.  Neurological:     General: No focal deficit present.     Mental Status: He is alert and oriented to person, place, and time.  Psychiatric:        Mood and Affect: Mood normal.        Behavior: Behavior normal.     LABORATORY DATA:  I have reviewed the data as listed Recent Results (from the past 2160 hour(s))  Fe+TIBC+Fer     Status: Abnormal   Collection Time: 07/05/21 11:45 AM  Result Value Ref Range   Total Iron Binding Capacity 324 250 - 450 ug/dL   UIBC 253 111 - 343 ug/dL   Iron 71 38 - 169 ug/dL   Iron Saturation 22 15 - 55 %   Ferritin 658 (H) 30 - 400 ng/mL  CBC w/Diff/Platelet     Status: Abnormal   Collection Time: 07/19/21  9:26 AM  Result Value Ref Range   WBC 9.1 3.4 - 10.8 x10E3/uL   RBC 4.13 (L) 4.14 - 5.80 x10E6/uL   Hemoglobin 12.9 (L) 13.0 - 17.7 g/dL   Hematocrit 37.1 (L) 37.5 - 51.0 %   MCV 90 79 - 97 fL   MCH 31.2 26.6 - 33.0 pg   MCHC 34.8 31.5 - 35.7 g/dL   RDW 15.1 11.6 - 15.4 %   Platelets 304 150 - 450 x10E3/uL   Neutrophils 63 Not Estab. %   Lymphs 18 Not Estab. %   Monocytes 9 Not Estab. %   Eos 8 Not Estab. %   Basos 1 Not Estab. %   Neutrophils Absolute 5.7 1.4 - 7.0 x10E3/uL   Lymphocytes Absolute  1.6 0.7 - 3.1 x10E3/uL   Monocytes Absolute 0.8 0.1 - 0.9 x10E3/uL   EOS (ABSOLUTE) 0.7 (H) 0.0 - 0.4 x10E3/uL   Basophils Absolute 0.1 0.0 - 0.2 x10E3/uL   Immature Granulocytes 1 Not Estab. %   Immature Grans (Abs) 0.1 0.0 - 0.1 x10E3/uL  Hemochromatosis DNA-PCR(c282y,h63d)     Status: None   Collection Time: 07/19/21  9:27 AM  Result Value Ref Range   Hemochromatosis Gene Comment     Comment: Results: c.845G>A (p.Cys282Tyr) - Detected, heterozygous c.187C>G (p.His63Asp) - Detected, heterozygous c.193A>T (p.Ser65Cys) - Not Detected Associated with a low risk to develop clinically relevant symptoms of hereditary hemochromatosis. Biochemical testing such as transferrin-iron saturation and/or serum ferritin studies is recommended to confirm a diagnosis. See Additional Information and Comments. Additional Clinical Information: Hereditary hemochromatosis (HFE related) is an autosomal recessive iron storage disorder. Patients may have a genetic diagnosis of hereditary hemochromatosis and never show clinical symptoms. Clinical symptoms typically appear between 65 to 58 years in males and after menopause in females. Signs and symptoms may include organ damage, primarily in the liver, risk for hepatocellular carcinoma, diabetes, and heart disease due to iron accumulation. Life expectancy may be decreased in individuals who develop cirrhosis. Treatment for clinically  symptomatic individuals may include therapeutic phlebotomy. Liver transplant may be used to treat end stage liver failure. For preventive care, monitoring for iron overload is recommended for patients who are homozygous for c.845G>A (p.Cys282Tyr) and have yet to experience clinical symptoms. Comments: The most common HFE variants associated with hereditary hemochromatosis are c.845G>A (p.Cys282Tyr), c.187C>G (p.His63Asp), c.193A>T (p.Ser65Cys). While patients homozygous for c.845G>A (p.Cys282Tyr) are the most  likely to  present clinical symptoms, less than 10% develop clinically significant iron overload with tissue and organ damage. Genetic counseling is recommended to discuss the potential clinical implications of positive results, as well as recommendations for testing family members. Genetic Coordinators are available for health care providers to discuss results at [phone]). Test Details: Three variants analyzed: c.845G>A (p.Cys282Tyr), commonly refe rred to as C282Y c.187C>G (p.His63Asp), commonly referred to as H63D c.193A>T (p.Ser65Cys), commonly referred to as S65C Methods/Limitations: DNA Analysis of the HFE gene (NM_[id number]) was performed by PCR amplification followed by restriction enzyme digestion analyses. Results must be combined with clinical information for the most accurate interpretation. Molecular-based testing is highly accurate, but as in any laboratory test, diagnostic errors may occur. False positive or false negative results may occur for reasons that include genetic variants, blood transfusions, bone marrow transplantation, somatic or tissue-specific mosaicism, mislabeled samples, or erroneous representation of family relationships. This test was developed and its performance characteristics determined by [facility]. It has not been cleared or approved by the Food and Drug Administration. References: Ula Lingo, [address] [place]owdley Milagros Reap LW, [place]; American Association for the Study of Live r Diseases. Diagnosis and management of hemochromatosis: 2011 practice guideline by the American Association for the Study of Liver Diseases. Hepatology. 2011 Jul;54(1):328-43. doi: 10.1002/hep.24330. PMID: UL:9311329; PMCIDHE:8380849. [address] Brissot P, Swinkels DW, [employer], Kamarainen O, Patton S, Alonso I, Morris M, [place] best practice guidelines for the molecular genetic diagnosis of hereditary hemochromatosis [facility]). Eur J Hum Genet. 2016 Apr;24(4):479-95.  doi: 10.1038/ejhg.2015.128. Epub 2015 Jul 8. PMID: LF:6474165; PMCIDUJ:1656327. Ruben Reason, PhD, [facility] Jane Canary, PhD Earlean Polka, PhD, [facility] Fannie Knee, PhD, [facility] Threasa Alpha, PhD, [facility] Gailen Shelter, PhD, [facility] Lubertha South, PhD, [facility] Alfredo Bach, PhD, [facility]     RADIOGRAPHIC STUDIES: I have personally reviewed the radiological images as listed and agreed with the findings in the report. No results found.  ASSESSMENT:  Elevated ferritin and normocytic anemia: - He reported that he always donated blood at [facility].  However he was found to be anemic in August 2021 with hemoglobin 10.3.  His ferritin was 689 on 06/14/2020. - He was evaluated by GI and was started on iron pill around November 2021 through May 2022. - Most recent hemoglobin on 07/19/2021 improved to 12.9. - Elevated ferritin 689 (06/14/2020), 501 (10/26/2020), (587) 04/16/2021, 658 (07/05/2021).  Percent saturation varied from 22 to 48%. 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 - He does not have any known history of liver disorders.  No prior transfusions. - Colonoscopy on 03/21/2015 with chronic diverticulosis.  Stool for occult blood was negative previously.   Social/family history: - Lives at home with his wife.  He worked in Museum/gallery exhibitions officer.  No chemical exposure.  Non-smoker. - No family history of hemochromatosis.  No malignancies.   PLAN:  Elevated ferritin: - We will repeat ferritin and iron panel today. - Double heterozygosity of hemochromatosis mutations likely contributing to some of the elevation of the ferritin. - We will check ultrasound of the liver to evaluate for fatty liver.  Elevated serum ferritin can also be seen in myelodysplasia due to ineffective erythropoiesis and increased iron absorption from the gut. - RTC 2 to 3 weeks to discuss results.  2.  Normocytic anemia: - We will repeat CBC.  We will  check for other nutritional deficiencies including 123456, folic acid, copper, methylmalonic  acid. - Early MDS could also be contributing to his mild anemia.    All questions were answered. The patient knows to call the clinic with any problems, questions or concerns.  Derek Jack, MD 08/23/21 8:48 AM  [place] [phone]   I, Thana Ates, am acting as a scribe for Dr. Derek Jack.  I, Derek Jack MD, have reviewed the above documentation for accuracy and completeness, and I agree with the above.

## 2021-08-23 ENCOUNTER — Inpatient Hospital Stay (HOSPITAL_COMMUNITY): Payer: Medicare HMO

## 2021-08-23 ENCOUNTER — Encounter (HOSPITAL_COMMUNITY): Payer: Self-pay | Admitting: Hematology

## 2021-08-23 ENCOUNTER — Inpatient Hospital Stay (HOSPITAL_COMMUNITY): Payer: Medicare HMO | Attending: Hematology | Admitting: Hematology

## 2021-08-23 ENCOUNTER — Other Ambulatory Visit: Payer: Self-pay

## 2021-08-23 VITALS — BP 136/80 | HR 50 | Temp 98.2°F | Resp 18 | Ht 71.0 in | Wt 210.0 lb

## 2021-08-23 DIAGNOSIS — Z87891 Personal history of nicotine dependence: Secondary | ICD-10-CM | POA: Diagnosis not present

## 2021-08-23 DIAGNOSIS — R7989 Other specified abnormal findings of blood chemistry: Secondary | ICD-10-CM | POA: Diagnosis not present

## 2021-08-23 DIAGNOSIS — D649 Anemia, unspecified: Secondary | ICD-10-CM | POA: Diagnosis not present

## 2021-08-23 LAB — CBC WITH DIFFERENTIAL/PLATELET
Abs Immature Granulocytes: 0.03 10*3/uL (ref 0.00–0.07)
Basophils Absolute: 0.1 10*3/uL (ref 0.0–0.1)
Basophils Relative: 1 %
Eosinophils Absolute: 0.8 10*3/uL — ABNORMAL HIGH (ref 0.0–0.5)
Eosinophils Relative: 12 %
HCT: 37 % — ABNORMAL LOW (ref 39.0–52.0)
Hemoglobin: 12.2 g/dL — ABNORMAL LOW (ref 13.0–17.0)
Immature Granulocytes: 1 %
Lymphocytes Relative: 22 %
Lymphs Abs: 1.4 10*3/uL (ref 0.7–4.0)
MCH: 30.7 pg (ref 26.0–34.0)
MCHC: 33 g/dL (ref 30.0–36.0)
MCV: 93.2 fL (ref 80.0–100.0)
Monocytes Absolute: 0.7 10*3/uL (ref 0.1–1.0)
Monocytes Relative: 11 %
Neutro Abs: 3.3 10*3/uL (ref 1.7–7.7)
Neutrophils Relative %: 53 %
Platelets: 278 10*3/uL (ref 150–400)
RBC: 3.97 MIL/uL — ABNORMAL LOW (ref 4.22–5.81)
RDW: 15.1 % (ref 11.5–15.5)
WBC: 6.2 10*3/uL (ref 4.0–10.5)
nRBC: 0 % (ref 0.0–0.2)

## 2021-08-23 LAB — COMPREHENSIVE METABOLIC PANEL
ALT: 17 U/L (ref 0–44)
AST: 24 U/L (ref 15–41)
Albumin: 3.9 g/dL (ref 3.5–5.0)
Alkaline Phosphatase: 65 U/L (ref 38–126)
Anion gap: 8 (ref 5–15)
BUN: 18 mg/dL (ref 8–23)
CO2: 25 mmol/L (ref 22–32)
Calcium: 9 mg/dL (ref 8.9–10.3)
Chloride: 106 mmol/L (ref 98–111)
Creatinine, Ser: 0.97 mg/dL (ref 0.61–1.24)
GFR, Estimated: >60 mL/min (ref >60)
Glucose, Bld: 108 mg/dL — ABNORMAL HIGH (ref 70–99)
Potassium: 3.7 mmol/L (ref 3.5–5.1)
Sodium: 139 mmol/L (ref 135–145)
Total Bilirubin: 0.7 mg/dL (ref 0.3–1.2)
Total Protein: 7.1 g/dL (ref 6.5–8.1)

## 2021-08-23 LAB — RETICULOCYTES
Immature Retic Fract: 13.6 % (ref 2.3–15.9)
RBC.: 3.86 MIL/uL — ABNORMAL LOW (ref 4.22–5.81)
Retic Count, Absolute: 53.3 10*3/uL (ref 19.0–186.0)
Retic Ct Pct: 1.4 % (ref 0.4–3.1)

## 2021-08-23 LAB — VITAMIN B12: Vitamin B-12: 306 pg/mL (ref 180–914)

## 2021-08-23 LAB — IRON AND TIBC
Iron: 139 ug/dL (ref 45–182)
Saturation Ratios: 31 % (ref 17.9–39.5)
TIBC: 442 ug/dL (ref 250–450)
UIBC: 303 ug/dL

## 2021-08-23 LAB — FOLATE: Folate: 8.9 ng/mL (ref >5.9)

## 2021-08-23 LAB — FERRITIN: Ferritin: 351 ng/mL — ABNORMAL HIGH (ref 24–336)

## 2021-08-23 LAB — LACTATE DEHYDROGENASE: LDH: 158 U/L (ref 98–192)

## 2021-08-23 NOTE — Patient Instructions (Addendum)
[facility] at [facility] Discharge Instructions  You were seen and examined today by Dr. Ellin Saba. Dr. Ellin Saba is a hematologist, meaning that he specializes in blood disorders. Dr. Ellin Saba discussed your past medical history, family history of cancer/blood disorders, and the events that led to you being here today.  You were referred to Dr. Ellin Saba due to elevated ferritin (iron storage). The Gastroenterologist also checked a lab test known as Hemochromatosis Panel, which was abnormal. You were also slightly anemic.   Dr. Ellin Saba has also recommended additional lab work to recheck your iron and attempt to identify the cause of your anemia. Dr. Ellin Saba has also recommended an ultrasound of your liver, because sometimes liver abnormalities lead to increased iron levels.  Follow-up as scheduled.    Thank you for choosing [facility] at [facility] to provide your oncology and hematology care.  To afford each patient quality time with our provider, please arrive at least 15 minutes before your scheduled appointment time.   If you have a lab appointment with the Cancer Center please come in thru the Main Entrance and check in at the main information desk.  You need to re-schedule your appointment should you arrive 10 or more minutes late.  We strive to give you quality time with our providers, and arriving late affects you and other patients whose appointments are after yours.  Also, if you no show three or more times for appointments you may be dismissed from the clinic at the providers discretion.     Again, thank you for choosing [facility].  Our hope is that these requests will decrease the amount of time that you wait before being seen by our physicians.       _____________________________________________________________  Should you have questions after your visit to [facility], please contact our office  at [phone] and follow the prompts.  Our office hours are 8:00 a.m. and 4:30 p.m. Monday - Friday.  Please note that voicemails left after 4:00 p.m. may not be returned until the following business day.  We are closed weekends and major holidays.  You do have access to a nurse 24-7, just call the main number to the clinic [phone] and do not press any options, hold on the line and a nurse will answer the phone.    For prescription refill requests, have your pharmacy contact our office and allow 72 hours.    Due to Covid, you will need to wear a mask upon entering the hospital. If you do not have a mask, a mask will be given to you at the Main Entrance upon arrival. For doctor visits, patients may have 1 support person age 64 or older with them. For treatment visits, patients can not have anyone with them due to social distancing guidelines and our immunocompromised population.

## 2021-08-26 LAB — COPPER, SERUM: Copper: 99 ug/dL (ref 69–132)

## 2021-08-27 LAB — PROTEIN ELECTROPHORESIS, SERUM
A/G Ratio: 1.1 (ref 0.7–1.7)
Albumin ELP: 3.7 g/dL (ref 2.9–4.4)
Alpha-1-Globulin: 0.2 g/dL (ref 0.0–0.4)
Alpha-2-Globulin: 0.9 g/dL (ref 0.4–1.0)
Beta Globulin: 1.1 g/dL (ref 0.7–1.3)
Gamma Globulin: 1.1 g/dL (ref 0.4–1.8)
Globulin, Total: 3.3 g/dL (ref 2.2–3.9)
Total Protein ELP: 7 g/dL (ref 6.0–8.5)

## 2021-08-27 LAB — METHYLMALONIC ACID, SERUM: Methylmalonic Acid, Quantitative: 286 nmol/L (ref 0–378)

## 2021-08-30 DIAGNOSIS — Z23 Encounter for immunization: Secondary | ICD-10-CM | POA: Diagnosis not present

## 2021-09-02 ENCOUNTER — Other Ambulatory Visit: Payer: Self-pay

## 2021-09-02 ENCOUNTER — Ambulatory Visit (HOSPITAL_COMMUNITY)
Admission: RE | Admit: 2021-09-02 | Discharge: 2021-09-02 | Disposition: A | Discharge status: Home / Self Care | Payer: Medicare HMO | Source: Ambulatory Visit | Attending: Hematology | Admitting: Hematology

## 2021-09-02 DIAGNOSIS — R7989 Other specified abnormal findings of blood chemistry: Secondary | ICD-10-CM | POA: Diagnosis not present

## 2021-09-02 DIAGNOSIS — D649 Anemia, unspecified: Secondary | ICD-10-CM | POA: Diagnosis not present

## 2021-09-02 DIAGNOSIS — K760 Fatty (change of) liver, not elsewhere classified: Secondary | ICD-10-CM | POA: Diagnosis not present

## 2021-09-06 ENCOUNTER — Ambulatory Visit (HOSPITAL_COMMUNITY): Payer: Medicare HMO | Admitting: Hematology

## 2021-09-11 NOTE — Progress Notes (Signed)
[place], [place] [address]   CLINIC:  Medical Oncology/Hematology  PCP:  Kathyrn Drown, MD [place] / [place] [address]  [phone]  REASON FOR VISIT:  Follow-up for elevated ferritin and normocytic anemia  PRIOR THERAPY: none  CURRENT THERAPY: surveillance  INTERVAL HISTORY:  [name], a 80 y.o. male, returns for routine follow-up for his elevated ferritin and normocytic anemia. Hector [name] was last seen on 08/23/2021.  Today he reports feeling good.   REVIEW OF SYSTEMS:  Review of Systems  Constitutional:  Negative for appetite change and fatigue.  HENT:   Positive for trouble swallowing (teeth).   All other systems reviewed and are negative.  PAST MEDICAL/SURGICAL HISTORY:  Past Medical History:  Diagnosis Date   Chronic hip pain    Left hip pain   ED (erectile dysfunction)    Essential hypertension    Hyperlipidemia    IFG (impaired fasting glucose)    Noncompliance    Normocytic anemia    Obesity    Osteoarthritis    Past Surgical History:  Procedure Laterality Date   BILATERAL KNEE ARTHROSCOPY     CHOLECYSTECTOMY  2010   COLONOSCOPY N/A 03/21/2015   Surgeon: Daneil Dolin, MD;  Multiple anal papilla-1 prominent, normal-appearing rectal mucosa, scattered left-sided diverticula, the remainder of the exam was normal.  No recommendations to repeat colonoscopy.   HIP FRACTURE SURGERY  2009   LEFT HIP   KNEE ARTHROTOMY BILATERAL 50 + YRS AGO     REFRACTIVE SURGERY Bilateral    LASIK   TOTAL HIP ARTHROPLASTY Left 07/24/2014   Procedure: CONVERSION OF PREVIOUS HIP SURGERY LEFT HIP ARTHROPLASTY;  Surgeon: Mauri Pole, MD;  Location: [facility];  Service: Orthopedics;  Laterality: Left;   TOTAL KNEE ARTHROPLASTY Right 02/15/2019   Procedure: TOTAL KNEE ARTHROPLASTY;  Surgeon: Paralee Cancel, MD;  Location: [facility];  Service: Orthopedics;  Laterality: Right;  70 mins    SOCIAL HISTORY:  Social  History   Socioeconomic History   Marital status: Married    Spouse name: Not on file   Number of children: Not on file   Years of education: Not on file   Highest education level: Not on file  Occupational History   Not on file  Tobacco Use   Smoking status: Former    Types: Pipe    Quit date: 1963    Years since quitting: 60.0   Smokeless tobacco: Never   Tobacco comments:    quit 40-50 years ago  Vaping Use   Vaping Use: Never used  Substance and Sexual Activity   Alcohol use: No    Comment: QUIT SMOKING AGE 6   Drug use: No   Sexual activity: Not on file  Other Topics Concern   Not on file  Social History Narrative   Not on file   Social Determinants of Health   Financial Resource Strain: Low Risk    Difficulty of Paying Living Expenses: Not hard at all  Food Insecurity: No Food Insecurity   Worried About Charity fundraiser in the Last Year: Never true   [place] in the Last Year: Never true  Transportation Needs: No Transportation Needs   Lack of Transportation (Medical): No   Lack of Transportation (Non-Medical): No  Physical Activity: Inactive   Days of Exercise per Week: 0 days   Minutes of Exercise per Session: 0 min  Stress: No  Stress Concern Present   Feeling of Stress : Not at all  Social Connections: Socially Integrated   Frequency of Communication with Friends and Family: More than three times a week   Frequency of Social Gatherings with Friends and Family: Once a week   Attends Religious Services: More than 4 times per year   Active Member of [employer] or Organizations: Yes   Attends Music therapist: More than 4 times per year   Marital Status: Married  Human resources officer Violence: Not At Risk   Fear of Current or Ex-Partner: No   Emotionally Abused: No   Physically Abused: No   Sexually Abused: No    FAMILY HISTORY:  Family History  Problem Relation Age of Onset   Hypertension Mother    Diabetes Mother    Hypertension  Father    Heart attack Father    Diabetes Sister    Colon cancer Neg Hx    Inflammatory bowel disease Neg Hx     CURRENT MEDICATIONS:  Current Outpatient Medications  Medication Sig Dispense Refill   amLODipine (NORVASC) 10 MG tablet TAKE 1 TABLET EVERY DAY 90 tablet 1   benazepril (LOTENSIN) 40 MG tablet TAKE 1 TABLET TWICE DAILY 180 tablet 3   calcium carbonate (TUMS - DOSED IN MG ELEMENTAL CALCIUM) 500 MG chewable tablet Chew 1 tablet by mouth as needed for indigestion or heartburn.     carvedilol (COREG) 12.5 MG tablet TAKE 1 TABLET TWICE DAILY 180 tablet 3   cloNIDine (CATAPRES) 0.1 MG tablet TAKE 1 TABLET TWICE DAILY 180 tablet 1   gemfibrozil (LOPID) 600 MG tablet TAKE 1 TABLET TWICE DAILY BEFORE MEALS 180 tablet 1   spironolactone (ALDACTONE) 25 MG tablet TAKE 1 TABLET EVERY DAY 90 tablet 3   No current facility-administered medications for this visit.    ALLERGIES:  Allergies  Allergen Reactions   Lipitor [Atorvastatin]     Muscle and joint pain    PHYSICAL EXAM:  Performance status (ECOG): 1 - Symptomatic but completely ambulatory  Vitals:   09/12/21 0956  BP: (!) 151/69  Pulse: (!) 43  Resp: 18  Temp: (!) 97.5 F (36.4 C)  SpO2: 98%   Wt Readings from Last 3 Encounters:  09/12/21 211 lb 10.3 oz (96 kg)  08/23/21 210 lb (95.3 kg)  08/12/21 213 lb (96.6 kg)   Physical Exam Vitals reviewed.  Constitutional:      Appearance: Normal appearance.  Cardiovascular:     Rate and Rhythm: Normal rate and regular rhythm.     Pulses: Normal pulses.     Heart sounds: Normal heart sounds.  Pulmonary:     Effort: Pulmonary effort is normal.     Breath sounds: Normal breath sounds.  Neurological:     General: No focal deficit present.     Mental Status: He is alert and oriented to person, place, and time.  Psychiatric:        Mood and Affect: Mood normal.        Behavior: Behavior normal.    LABORATORY DATA:  I have reviewed the labs as listed.  CBC Latest  Ref Rng & Units 08/23/2021 07/19/2021 04/16/2021  WBC 4.0 - 10.5 K/uL 6.2 9.1 7.8  Hemoglobin 13.0 - 17.0 g/dL 12.2(L) 12.9(L) 11.9(L)  Hematocrit 39.0 - 52.0 % 37.0(L) 37.1(L) 34.5(L)  Platelets 150 - 400 K/uL 278 304 273   CMP Latest Ref Rng & Units 08/23/2021 10/26/2020 05/22/2020  Glucose 70 - 99 mg/dL 108(H) 98 99  BUN 8 - 23 mg/dL 18 13 24   Creatinine 0.61 - 1.24 mg/dL 1.61 0.96)  Sodium 135 - 145 mmol/L 139 142 141  Potassium 3.5 - 5.1 mmol/L 3.7 4.0 4.7  Chloride 98 - 111 mmol/L 106 105 108(H)  CO2 22 - 32 mmol/L 25 21 19(L)  Calcium 8.9 - 10.3 mg/dL 9.0 9.3 9.6  Total Protein 6.5 - 8.1 g/dL 7.1 - 6.8  Total Bilirubin 0.3 - 1.2 mg/dL 0.7 - 0.4  Alkaline Phos 38 - 126 U/L 65 - 75  AST 15 - 41 U/L 24 - 20  ALT 0 - 44 U/L 17 - 23      Component Value Date/Time   RBC 3.86 (L) 08/23/2021 0851   RBC 3.97 (L) 08/23/2021 0851   MCV 93.2 08/23/2021 0851   MCV 90 07/19/2021 0926   MCH 30.7 08/23/2021 0851   MCHC 33.0 08/23/2021 0851   RDW 15.1 08/23/2021 0851   RDW 15.1 07/19/2021 0926   LYMPHSABS 1.4 08/23/2021 0851   LYMPHSABS 1.6 07/19/2021 0926   MONOABS 0.7 08/23/2021 0851   EOSABS 0.8 (H) 08/23/2021 0851   EOSABS 0.7 (H) 07/19/2021 0926   BASOSABS 0.1 08/23/2021 0851   BASOSABS 0.1 07/19/2021 0926    DIAGNOSTIC IMAGING:  I have independently reviewed the scans and discussed with the patient. 07/21/2021 Abdomen Limited RUQ (LIVER/GB)  Result Date: 09/03/2021 CLINICAL DATA:  Increased serum ferritin anemia EXAM: ULTRASOUND ABDOMEN LIMITED RIGHT UPPER QUADRANT COMPARISON:  None. FINDINGS: Gallbladder: Gallbladder is not seen consistent with cholecystectomy. Common bile duct: Diameter: 3.8 mm Liver: There is increased cortical echogenicity. There are scattered areas of areas of sparing from fatty infiltration. There are no discrete focal space-occupying lesions in the visualized portions of liver. Portal vein is patent on color Doppler imaging with normal direction of blood flow  towards the liver. Other: None. IMPRESSION: Status post cholecystectomy. There is no dilation of bile ducts. Hepatic steatosis. Electronically Signed   By: 09/05/2021 M.D.   On: 09/03/2021 16:00     ASSESSMENT:  Elevated ferritin and normocytic anemia: - He reported that he always donated blood at 09/05/2021.  However he was found to be anemic in August 2021 with hemoglobin 10.3.  His ferritin was 689 on 06/14/2020. - He was evaluated by GI and was started on iron pill around November 2021 through May 2022. - Most recent hemoglobin on 07/19/2021 improved to 12.9. - Elevated ferritin 689 (06/14/2020), 501 (10/26/2020), (587) 04/16/2021, 658 (07/05/2021).  Percent saturation varied from 22 to 48%. 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 - He does not have any known history of liver disorders.  No prior transfusions. - Colonoscopy on 03/21/2015 with chronic diverticulosis.  Stool for occult blood was negative previously.    Social/family history: - Lives at home with his wife.  He worked in 03/23/2015.  No chemical exposure.  Non-smoker. - No family history of hemochromatosis.  No malignancies.   PLAN:  Elevated ferritin: -Double heterozygosity for hemochromatosis mutations, likely contributing to some of the elevation of the ferritin. - We have reviewed labs from 08/23/2021.  Ferritin improved to 351 from 658 on 07/05/2021.  Percent saturation is 31. - We have reviewed ultrasound of the abdomen which showed increased echogenicity of the liver consistent with hepatic steatosis. - Elevated ferritin also likely from hepatic steatosis.  Early MDS also can contribute to elevated iron levels ferritin levels from increased absorption. - No indication  for phlebotomy or chelation at this time.  RTC 2 months with repeat CBC, ferritin and iron panel.   2.  Normocytic anemia: - He has very mild anemia with hemoglobin 12.2. -  Work-up showed normal B12, MMA, copper, folic acid.  Ferritin was 351 and percent saturation 31.  SPEP was negative. - Early MDS is a possibility.  Orders placed this encounter:  No orders of the defined types were placed in this encounter.    Derek Jack, MD [place] [phone]   I, Hector [name], am acting as a scribe for Dr. Derek Jack.  I, Derek Jack MD, have reviewed the above documentation for accuracy and completeness, and I agree with the above.

## 2021-09-12 ENCOUNTER — Inpatient Hospital Stay (HOSPITAL_COMMUNITY): Payer: Medicare HMO | Admitting: Hematology

## 2021-09-12 ENCOUNTER — Other Ambulatory Visit: Payer: Self-pay

## 2021-09-12 VITALS — BP 151/69 | HR 43 | Temp 97.5°F | Resp 18 | Ht 71.0 in | Wt 211.6 lb

## 2021-09-12 DIAGNOSIS — R7989 Other specified abnormal findings of blood chemistry: Secondary | ICD-10-CM

## 2021-09-12 DIAGNOSIS — Z87891 Personal history of nicotine dependence: Secondary | ICD-10-CM | POA: Diagnosis not present

## 2021-09-12 DIAGNOSIS — D649 Anemia, unspecified: Secondary | ICD-10-CM

## 2021-09-12 NOTE — Patient Instructions (Signed)
[facility] Discharge Instructions   You were seen and examined today by Dr. Ellin Saba. He reviewed your lab work with you. Return as scheduled in 2 months with lab work prior.    Thank you for choosing  [facility] Center at [facility] to provide your oncology and hematology care.  To afford each patient quality time with our provider, please arrive at least 15 minutes before your scheduled appointment time.   If you have a lab appointment with the Cancer Center please come in thru the Main Entrance and check in at the main information desk.  You need to re-schedule your appointment should you arrive 10 or more minutes late.  We strive to give you quality time with our providers, and arriving late affects you and other patients whose appointments are after yours.  Also, if you no show three or more times for appointments you may be dismissed from the clinic at the providers discretion.     Again, thank you for choosing [facility].  Our hope is that these requests will decrease the amount of time that you wait before being seen by our physicians.       _____________________________________________________________  Should you have questions after your visit to [facility], please contact our office at [phone] and follow the prompts.  Our office hours are 8:00 a.m. and 4:30 p.m. Monday - Friday.  Please note that voicemails left after 4:00 p.m. may not be returned until the following business day.  We are closed weekends and major holidays.  You do have access to a nurse 24-7, just call the main number to the clinic [phone] and do not press any options, hold on the line and a nurse will answer the phone.    For prescription refill requests, have your pharmacy contact our office and allow 72 hours.    Due to Covid, you will need to wear a mask upon entering the hospital. If you do not have a mask, a mask  will be given to you at the Main Entrance upon arrival. For doctor visits, patients may have 1 support person age 30 or older with them. For treatment visits, patients can not have anyone with them due to social distancing guidelines and our immunocompromised population.

## 2021-10-10 ENCOUNTER — Other Ambulatory Visit: Payer: Self-pay | Admitting: Student

## 2021-10-30 NOTE — Progress Notes (Signed)
Referring Provider: Annalee Genta, DO Primary Care Physician:  Babs Sciara, MD Primary GI Physician: Dr. Jena Gauss  Chief Complaint  Patient presents with   Anemia    No bleeding or dark stool    HPI:   Hector [name] is a 81 y.o. male presenting today for follow-up.  History of normocytic anemia dating back to May 2020 with hemoglobin in upper 11/low12 range, B12, folate, iron panel within normal limits, empirically started oral iron around November 2021, but this was discontinued in May 2022 per my recommendation as he had no iron deficiency. Celiac screen negative, TSH normal.  Last colonoscopy June 2016 with multiple anal papilla, normal-appearing rectal mucosa, scattered left-sided diverticula.  No recommendations to repeat colonoscopy. No prior EGD.   Last seen in our office in August 2022.  He had only taken iron 2-3 times since May 2022 as I had previously recommended discontinuing and monitoring.  He denied overt GI bleeding.  No significant lower GI symptoms.  Occasional reflux that was well controlled with Tums as needed.  Admitted to ibuprofen once a week or less.  His most recent blood work at that time was in July with a hemoglobin of 11.9 (previously 12.1 in February 2022).  Iron 169, iron saturation 40%, ferritin elevated at 587.  Etiology of anemia was not clear.  Have discussed scheduling first-ever EGD and repeating a colonoscopy to ensure no contributing GI source, but patient declined.  Recommended continuing to monitor for now with repeat labs in 3 months.  Would consider EGD and colonoscopy if he developed IDA.  Labs in October 2022 with hemoglobin 12.9, normocytic indices, iron 71, saturation 22%, ferritin 658.  Hemochromatosis DNA detected heterozygosity of Cys282Tyr and His63Asp.  Recommended referral to hematology.  He has established with hematology.  Ferritin was repeated in December and had improved to 351, hemoglobin stable at 12.2.  No indication for  phlebotomy or chelation. They plan to continue to monitor hemoglobin and iron.  Stated early MDS is a possibility.   Today:  Reports he is doing very well overall.  Denies BRBPR or melena.  No GI concerns.  Denies constipation, diarrhea, abdominal pain, unintentional weight loss, nausea, vomiting.  Rare reflux symptoms that responds well to Tums.  No dysphagia.  NSAIDs: Rare ibuprofen.   Hemoccult negative x3 in October 2021.  Past Medical History:  Diagnosis Date   Chronic hip pain    Left hip pain   ED (erectile dysfunction)    Essential hypertension    Hyperlipidemia    IFG (impaired fasting glucose)    Noncompliance    Normocytic anemia    Obesity    Osteoarthritis     Past Surgical History:  Procedure Laterality Date   BILATERAL KNEE ARTHROSCOPY     CHOLECYSTECTOMY  2010   COLONOSCOPY N/A 03/21/2015   Surgeon: Corbin Ade, MD;  Multiple anal papilla-1 prominent, normal-appearing rectal mucosa, scattered left-sided diverticula, the remainder of the exam was normal.  No recommendations to repeat colonoscopy.   HIP FRACTURE SURGERY  2009   LEFT HIP   KNEE ARTHROTOMY BILATERAL 50 + YRS AGO     REFRACTIVE SURGERY Bilateral    LASIK   TOTAL HIP ARTHROPLASTY Left 07/24/2014   Procedure: CONVERSION OF PREVIOUS HIP SURGERY LEFT HIP ARTHROPLASTY;  Surgeon: [name], MD;  Location: [facility];  Service: Orthopedics;  Laterality: Left;   TOTAL KNEE ARTHROPLASTY Right 02/15/2019   Procedure: TOTAL KNEE ARTHROPLASTY;  Surgeon: Charlann Boxer,  Molli Hazard, MD;  Location: [facility];  Service: Orthopedics;  Laterality: Right;  70 mins    Current Outpatient Medications  Medication Sig Dispense Refill   amLODipine (NORVASC) 10 MG tablet TAKE 1 TABLET EVERY DAY 90 tablet 1   benazepril (LOTENSIN) 40 MG tablet TAKE 1 TABLET TWICE DAILY 180 tablet 3   calcium carbonate (TUMS - DOSED IN MG ELEMENTAL CALCIUM) 500 MG chewable tablet Chew 1 tablet by mouth as needed for indigestion or heartburn.      carvedilol (COREG) 12.5 MG tablet TAKE 1 TABLET TWICE DAILY 180 tablet 3   cloNIDine (CATAPRES) 0.1 MG tablet TAKE 1 TABLET TWICE DAILY 180 tablet 1   gemfibrozil (LOPID) 600 MG tablet TAKE 1 TABLET TWICE DAILY BEFORE MEALS 180 tablet 1   spironolactone (ALDACTONE) 25 MG tablet TAKE 1 TABLET EVERY DAY 90 tablet 3   No current facility-administered medications for this visit.    Allergies as of 10/31/2021 - Review Complete 10/31/2021  Allergen Reaction Noted   Lipitor [atorvastatin]  01/07/2013    Family History  Problem Relation Age of Onset   Hypertension Mother    Diabetes Mother    Hypertension Father    Heart attack Father    Diabetes Sister    Colon cancer Neg Hx    Inflammatory bowel disease Neg Hx     Social History   Socioeconomic History   Marital status: Married    Spouse name: Not on file   Number of children: Not on file   Years of education: Not on file   Highest education level: Not on file  Occupational History   Not on file  Tobacco Use   Smoking status: Former    Types: Pipe    Quit date: 1963    Years since quitting: 60.1   Smokeless tobacco: Never   Tobacco comments:    quit 40-50 years ago  Vaping Use   Vaping Use: Never used  Substance and Sexual Activity   Alcohol use: No    Comment: QUIT SMOKING AGE 56   Drug use: No   Sexual activity: Not on file  Other Topics Concern   Not on file  Social History Narrative   Not on file   Social Determinants of Health   Financial Resource Strain: Low Risk    Difficulty of Paying Living Expenses: Not hard at all  Food Insecurity: No Food Insecurity   Worried About Programme researcher, broadcasting/film/video in the Last Year: Never true   Ran Out of Food in the Last Year: Never true  Transportation Needs: No Transportation Needs   Lack of Transportation (Medical): No   Lack of Transportation (Non-Medical): No  Physical Activity: Inactive   Days of Exercise per Week: 0 days   Minutes of Exercise per Session: 0 min   Stress: No Stress Concern Present   Feeling of Stress : Not at all  Social Connections: Socially Integrated   Frequency of Communication with Friends and Family: More than three times a week   Frequency of Social Gatherings with Friends and Family: Once a week   Attends Religious Services: More than 4 times per year   Active Member of [employer] or Organizations: Yes   Attends Engineer, structural: More than 4 times per year   Marital Status: Married    Review of Systems: Gen: Denies fever, chills, cold or flulike symptoms, presyncope, syncope. CV: Denies chest pain, palpitations. Resp: Denies dyspnea or cough. GI: See HPI Heme: See HPI  Physical Exam: BP (!) 171/71    Pulse (!) 50    Temp (!) 97.3 F (36.3 C) (Temporal)    Ht 5\' 11"  (1.803 m)    Wt 216 lb 12.8 oz (98.3 kg)    BMI 30.24 kg/m  General:   Alert and oriented. No distress noted. Pleasant and cooperative.  Head:  Normocephalic and atraumatic. Eyes:  Conjuctiva clear without scleral icterus. Heart:  S1, S2 present without murmurs appreciated. Lungs:  Clear to auscultation bilaterally. No wheezes, rales, or rhonchi. No distress.  Abdomen:  +BS, soft, non-tender and non-distended. No rebound or guarding. No HSM or masses noted. Msk:  Symmetrical without gross deformities. Normal posture. Extremities:  Without edema. Neurologic:  Alert and  oriented x4 Psych:  Normal mood and affect.    Assessment: 81 year old male presenting today for follow-up of normocytic anemia which dates back to May 2020 with hemoglobin in the upper 11/low 12 range.  Vitamin B12 and folate within normal limits.  No iron deficiency.  Hemoccult negative x3 in October 2021. He started oral iron empirically around November 2021, but this was discontinued in May 2022 per my recommendation as he did not have iron deficiency.  Celiac screen negative.  Previously with mild elevation of creatinine in May 2020 and again in August 2021, but otherwise wnl.   Last colonoscopy in 2016 with multiple anal papilla, normal-appearing rectal mucosa, scattered left-sided diverticula.  No recommendations to repeat colonoscopy.  No prior EGD.  He was found to have ferritin of 658 in October 2022 with heterozygosity for hemochromatosis and referred to hematology.  Repeat ferritin in December 2022 had improved to 351, hemoglobin at that time stable at 12.2.  Hematology plans to continue to monitor his labs.  Stated early MDS is a possibility. He denies overt GI bleeding or any other obvious blood loss, unintentional weight loss, or any other significant GI symptoms.  No regular NSAID use.   Etiology of his mild anemia is not clear, but in the setting of stable hemoglobin, normal/elevated iron studies, and Hemoccult negative stool previously, my suspicion for GI source is low.  Additionally, patient is not interested in any sort of GI evaluation unless absolutely necessary.  At this point, I have recommended he continue to follow with hematology and follow-up with our office as needed.   Plan:  Continue to follow closely with hematology for anemia and elevated ferritin. Avoid NSAIDs. Follow-up as needed.  Advised to monitor for bright red blood per rectum, black stools, change in bowel habits, decline in hemoglobin or iron, or any other GI concerns and let January 2023 know.   [place], PA-C [facility] 10/31/2021

## 2021-10-31 ENCOUNTER — Ambulatory Visit: Payer: Medicare HMO | Admitting: Gastroenterology

## 2021-10-31 ENCOUNTER — Encounter: Payer: Self-pay | Admitting: Gastroenterology

## 2021-10-31 ENCOUNTER — Other Ambulatory Visit: Payer: Self-pay

## 2021-10-31 VITALS — BP 171/71 | HR 50 | Temp 97.3°F | Ht 71.0 in | Wt 216.8 lb

## 2021-10-31 DIAGNOSIS — D649 Anemia, unspecified: Secondary | ICD-10-CM | POA: Diagnosis not present

## 2021-10-31 NOTE — Patient Instructions (Signed)
As you are doing very well and have no GI symptoms, we will follow-up with you as needed.  Please continue to follow closely with Dr. Ellin Saba for your anemia.  If you have a decline in your hemoglobin, your iron panel is low, you notice blood in your stools, black stools, change in bowel habits, abdominal pain, or have any other GI concerns, please let us know.   Continue to avoid NSAID products including ibuprofen, Aleve, Advil, BC powders, Goody powders, and anything that says "NSAID" on the package.  It was a pleasure to see you again!    Ermalinda Memos, PA-C [facility]

## 2021-11-13 ENCOUNTER — Inpatient Hospital Stay (HOSPITAL_COMMUNITY): Payer: Medicare HMO | Attending: Hematology

## 2021-11-13 DIAGNOSIS — D649 Anemia, unspecified: Secondary | ICD-10-CM | POA: Diagnosis not present

## 2021-11-13 DIAGNOSIS — R7989 Other specified abnormal findings of blood chemistry: Secondary | ICD-10-CM

## 2021-11-13 LAB — IRON AND TIBC
Iron: 140 ug/dL (ref 45–182)
Saturation Ratios: 34 % (ref 17.9–39.5)
TIBC: 411 ug/dL (ref 250–450)
UIBC: 271 ug/dL

## 2021-11-13 LAB — CBC WITH DIFFERENTIAL/PLATELET
Abs Immature Granulocytes: 0.05 10*3/uL (ref 0.00–0.07)
Basophils Absolute: 0.1 10*3/uL (ref 0.0–0.1)
Basophils Relative: 1 %
Eosinophils Absolute: 0.9 10*3/uL — ABNORMAL HIGH (ref 0.0–0.5)
Eosinophils Relative: 15 %
HCT: 37.9 % — ABNORMAL LOW (ref 39.0–52.0)
Hemoglobin: 13 g/dL (ref 13.0–17.0)
Immature Granulocytes: 1 %
Lymphocytes Relative: 26 %
Lymphs Abs: 1.6 10*3/uL (ref 0.7–4.0)
MCH: 31.9 pg (ref 26.0–34.0)
MCHC: 34.3 g/dL (ref 30.0–36.0)
MCV: 92.9 fL (ref 80.0–100.0)
Monocytes Absolute: 0.7 10*3/uL (ref 0.1–1.0)
Monocytes Relative: 11 %
Neutro Abs: 2.9 10*3/uL (ref 1.7–7.7)
Neutrophils Relative %: 46 %
Platelets: 268 10*3/uL (ref 150–400)
RBC: 4.08 MIL/uL — ABNORMAL LOW (ref 4.22–5.81)
RDW: 15.5 % (ref 11.5–15.5)
WBC: 6.2 10*3/uL (ref 4.0–10.5)
nRBC: 0 % (ref 0.0–0.2)

## 2021-11-13 LAB — FERRITIN: Ferritin: 392 ng/mL — ABNORMAL HIGH (ref 24–336)

## 2021-11-20 ENCOUNTER — Other Ambulatory Visit: Payer: Self-pay

## 2021-11-20 ENCOUNTER — Inpatient Hospital Stay (HOSPITAL_COMMUNITY): Payer: Medicare HMO | Attending: Hematology | Admitting: Hematology

## 2021-11-20 VITALS — BP 188/74 | HR 56 | Temp 97.3°F | Resp 18 | Ht 71.0 in | Wt 215.5 lb

## 2021-11-20 DIAGNOSIS — R7989 Other specified abnormal findings of blood chemistry: Secondary | ICD-10-CM | POA: Diagnosis not present

## 2021-11-20 DIAGNOSIS — D649 Anemia, unspecified: Secondary | ICD-10-CM | POA: Insufficient documentation

## 2021-11-20 NOTE — Patient Instructions (Addendum)
[facility] at [facility] ?Discharge Instructions ? ? ?You were seen and examined today by Dr. Ellin Saba. ? ?He reviewed the results of your lab work which is normal/stable. ? ?Your ferritin remains slightly elevated.  This may be caused by your fatty liver.  The only real treatment for this condition is to lose weight.  ? ?Return as scheduled for lab work and office visit.  ? ? ?Thank you for choosing [facility] at [facility] to provide your oncology and hematology care.  To afford each patient quality time with our provider, please arrive at least 15 minutes before your scheduled appointment time.  ? ?If you have a lab appointment with the Cancer Center please come in thru the Main Entrance and check in at the main information desk. ? ?You need to re-schedule your appointment should you arrive 10 or more minutes late.  We strive to give you quality time with our providers, and arriving late affects you and other patients whose appointments are after yours.  Also, if you no show three or more times for appointments you may be dismissed from the clinic at the providers discretion.     ?Again, thank you for choosing [facility].  Our hope is that these requests will decrease the amount of time that you wait before being seen by our physicians.       ?_____________________________________________________________ ? ?Should you have questions after your visit to [facility], please contact our office at [phone] and follow the prompts.  Our office hours are 8:00 a.m. and 4:30 p.m. Monday - Friday.  Please note that voicemails left after 4:00 p.m. may not be returned until the following business day.  We are closed weekends and major holidays.  You do have access to a nurse 24-7, just call the main number to the clinic [phone] and do not press any options, hold on the line and a nurse will answer the phone.   ? ?For prescription refill  requests, have your pharmacy contact our office and allow 72 hours.   ? ?Due to Covid, you will need to wear a mask upon entering the hospital. If you do not have a mask, a mask will be given to you at the Main Entrance upon arrival. For doctor visits, patients may have 1 support person age 19 or older with them. For treatment visits, patients can not have anyone with them due to social distancing guidelines and our immunocompromised population.  ? ?   ?

## 2021-11-20 NOTE — Progress Notes (Signed)
[facility] [address] [place] [place], [place] [address]   CLINIC:  Medical Oncology/Hematology  PCP:  Babs Sciara, MD [address] / [place] [address]  [phone]  REASON FOR VISIT:  Follow-up for elevated ferritin and normocytic anemia  PRIOR THERAPY: none  CURRENT THERAPY: surveillance  INTERVAL HISTORY:  Hector [name], a 81 y.o. male, returns for routine follow-up for his elevated ferritin and normocytic anemia. Hector [name] was last seen on 09/12/2021.  Today he reports feeling good. He denies recent infection or fevers.   REVIEW OF SYSTEMS:  Review of Systems  Constitutional:  Negative for appetite change, fatigue and fever.  All other systems reviewed and are negative.  PAST MEDICAL/SURGICAL HISTORY:  Past Medical History:  Diagnosis Date   Chronic hip pain    Left hip pain   ED (erectile dysfunction)    Essential hypertension    Hyperlipidemia    IFG (impaired fasting glucose)    Noncompliance    Normocytic anemia    Obesity    Osteoarthritis    Past Surgical History:  Procedure Laterality Date   BILATERAL KNEE ARTHROSCOPY     CHOLECYSTECTOMY  2010   COLONOSCOPY N/A 03/21/2015   Surgeon: Corbin Ade, MD;  Multiple anal papilla-1 prominent, normal-appearing rectal mucosa, scattered left-sided diverticula, the remainder of the exam was normal.  No recommendations to repeat colonoscopy.   HIP FRACTURE SURGERY  2009   LEFT HIP   KNEE ARTHROTOMY BILATERAL 50 + YRS AGO     REFRACTIVE SURGERY Bilateral    LASIK   TOTAL HIP ARTHROPLASTY Left 07/24/2014   Procedure: CONVERSION OF PREVIOUS HIP SURGERY LEFT HIP ARTHROPLASTY;  Surgeon: [name], MD;  Location: [facility];  Service: Orthopedics;  Laterality: Left;   TOTAL KNEE ARTHROPLASTY Right 02/15/2019   Procedure: TOTAL KNEE ARTHROPLASTY;  Surgeon: Durene Romans, MD;  Location: [facility];  Service: Orthopedics;  Laterality: Right;  70 mins    SOCIAL HISTORY:  Social History    Socioeconomic History   Marital status: Married    Spouse name: Not on file   Number of children: Not on file   Years of education: Not on file   Highest education level: Not on file  Occupational History   Not on file  Tobacco Use   Smoking status: Former    Types: Pipe    Quit date: 1963    Years since quitting: 60.2   Smokeless tobacco: Never   Tobacco comments:    quit 40-50 years ago  Vaping Use   Vaping Use: Never used  Substance and Sexual Activity   Alcohol use: No    Comment: QUIT SMOKING AGE 73   Drug use: No   Sexual activity: Not on file  Other Topics Concern   Not on file  Social History Narrative   Not on file   Social Determinants of Health   Financial Resource Strain: Low Risk    Difficulty of Paying Living Expenses: Not hard at all  Food Insecurity: No Food Insecurity   Worried About Programme researcher, broadcasting/film/video in the Last Year: Never true   Ran Out of Food in the Last Year: Never true  Transportation Needs: No Transportation Needs   Lack of Transportation (Medical): No   Lack of Transportation (Non-Medical): No  Physical Activity: Inactive   Days of Exercise per Week: 0 days   Minutes of Exercise per Session: 0 min  Stress: No Stress Concern Present  Feeling of Stress : Not at all  Social Connections: Socially Integrated   Frequency of Communication with Friends and Family: More than three times a week   Frequency of Social Gatherings with Friends and Family: Once a week   Attends Religious Services: More than 4 times per year   Active Member of [employer] or Organizations: Yes   Attends Engineer, structural: More than 4 times per year   Marital Status: Married  Catering manager Violence: Not At Risk   Fear of Current or Ex-Partner: No   Emotionally Abused: No   Physically Abused: No   Sexually Abused: No    FAMILY HISTORY:  Family History  Problem Relation Age of Onset   Hypertension Mother    Diabetes Mother    Hypertension Father     Heart attack Father    Diabetes Sister    Colon cancer Neg Hx    Inflammatory bowel disease Neg Hx     CURRENT MEDICATIONS:  Current Outpatient Medications  Medication Sig Dispense Refill   amLODipine (NORVASC) 10 MG tablet TAKE 1 TABLET EVERY DAY 90 tablet 1   benazepril (LOTENSIN) 40 MG tablet TAKE 1 TABLET TWICE DAILY 180 tablet 3   calcium carbonate (TUMS - DOSED IN MG ELEMENTAL CALCIUM) 500 MG chewable tablet Chew 1 tablet by mouth as needed for indigestion or heartburn.     carvedilol (COREG) 12.5 MG tablet TAKE 1 TABLET TWICE DAILY 180 tablet 3   cloNIDine (CATAPRES) 0.1 MG tablet TAKE 1 TABLET TWICE DAILY 180 tablet 1   gemfibrozil (LOPID) 600 MG tablet TAKE 1 TABLET TWICE DAILY BEFORE MEALS 180 tablet 1   No current facility-administered medications for this visit.    ALLERGIES:  Allergies  Allergen Reactions   Lipitor [Atorvastatin]     Muscle and joint pain   Lisinopril     Other reaction(s): Unknown    PHYSICAL EXAM:  Performance status (ECOG): 1 - Symptomatic but completely ambulatory  Vitals:   11/20/21 1129  BP: (!) 188/74  Pulse: (!) 56  Resp: 18  Temp: (!) 97.3 F (36.3 C)  SpO2: 97%   Wt Readings from Last 3 Encounters:  11/20/21 215 lb 8 oz (97.8 kg)  10/31/21 216 lb 12.8 oz (98.3 kg)  09/12/21 211 lb 10.3 oz (96 kg)   Physical Exam Vitals reviewed.  Constitutional:      Appearance: Normal appearance.  Cardiovascular:     Rate and Rhythm: Normal rate and regular rhythm.     Pulses: Normal pulses.     Heart sounds: Normal heart sounds.  Pulmonary:     Effort: Pulmonary effort is normal.     Breath sounds: Normal breath sounds.  Abdominal:     Palpations: Abdomen is soft. There is no hepatomegaly, splenomegaly or mass.     Tenderness: There is no abdominal tenderness.  Neurological:     General: No focal deficit present.     Mental Status: He is alert and oriented to person, place, and time.  Psychiatric:        Mood and Affect: Mood  normal.        Behavior: Behavior normal.    LABORATORY DATA:  I have reviewed the labs as listed.  CBC Latest Ref Rng & Units 11/13/2021 08/23/2021 07/19/2021  WBC 4.0 - 10.5 K/uL 6.2 6.2 9.1  Hemoglobin 13.0 - 17.0 g/dL 50.2 12.2(L) 12.9(L)  Hematocrit 39.0 - 52.0 % 37.9(L) 37.0(L) 37.1(L)  Platelets 150 - 400 K/uL 268 278  304   CMP Latest Ref Rng & Units 08/23/2021 10/26/2020 05/22/2020  Glucose 70 - 99 mg/dL 283(T) 98 99  BUN 8 - 23 mg/dL 18 13 24   Creatinine 0.61 - 1.24 mg/dL 5.17 6.16 0.73(X)  Sodium 135 - 145 mmol/L 139 142 141  Potassium 3.5 - 5.1 mmol/L 3.7 4.0 4.7  Chloride 98 - 111 mmol/L 106 105 108(H)  CO2 22 - 32 mmol/L 25 21 19(L)  Calcium 8.9 - 10.3 mg/dL 9.0 9.3 9.6  Total Protein 6.5 - 8.1 g/dL 7.1 - 6.8  Total Bilirubin 0.3 - 1.2 mg/dL 0.7 - 0.4  Alkaline Phos 38 - 126 U/L 65 - 75  AST 15 - 41 U/L 24 - 20  ALT 0 - 44 U/L 17 - 23      Component Value Date/Time   RBC 4.08 (L) 11/13/2021 0959   MCV 92.9 11/13/2021 0959   MCV 90 07/19/2021 0926   MCH 31.9 11/13/2021 0959   MCHC 34.3 11/13/2021 0959   RDW 15.5 11/13/2021 0959   RDW 15.1 07/19/2021 0926   LYMPHSABS 1.6 11/13/2021 0959   LYMPHSABS 1.6 07/19/2021 0926   MONOABS 0.7 11/13/2021 0959   EOSABS 0.9 (H) 11/13/2021 0959   EOSABS 0.7 (H) 07/19/2021 0926   BASOSABS 0.1 11/13/2021 0959   BASOSABS 0.1 07/19/2021 0926    DIAGNOSTIC IMAGING:  I have independently reviewed the scans and discussed with the patient. No results found.   ASSESSMENT:  Elevated ferritin and normocytic anemia: - He reported that he always donated blood at [employer].  However he was found to be anemic in August 2021 with hemoglobin 10.3.  His ferritin was 689 on 06/14/2020. - He was evaluated by GI and was started on iron pill around November 2021 through May 2022. - Most recent hemoglobin on 07/19/2021 improved to 12.9. - Elevated ferritin 689 (06/14/2020), 501 (10/26/2020), (587) 04/16/2021, 658 (07/05/2021).  Percent saturation  varied from 22 to 48%. 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 - He does not have any known history of liver disorders.  No prior transfusions. - Colonoscopy on 03/21/2015 with chronic diverticulosis.  Stool for occult blood was negative previously.    Social/family history: - Lives at home with his wife.  He worked in Surveyor, minerals.  No chemical exposure.  Non-smoker. - No family history of hemochromatosis.  No malignancies.   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 - Reviewed labs from 11/13/2021.  Ferritin is 392 and percent saturation 34.  CBC shows hemoglobin 13 with normal white count and platelet count.  Previous ferritin was 351. - Elevated ferritin most likely from fatty liver. - Recommend weight reduction by 10%. - No indication for phlebotomy or iron chelation.  Recommend RTC 6 months with repeat ferritin and iron panel.   2.  Normocytic anemia: -Previous work-up for B12, MMA, copper, folic acid, ferritin and iron panel was normal.  CBC on 11/13/2021 shows hemoglobin normalized to 13 with MCV 92.  Orders placed this encounter:  No orders of the defined types were placed in this encounter.    Doreatha Massed, MD [facility] [phone]   I, Alda Ponder, am acting as a scribe for Dr. Doreatha Massed.  I, Doreatha Massed MD, have reviewed the above documentation for accuracy and completeness, and I agree with the above.

## 2022-02-05 ENCOUNTER — Other Ambulatory Visit: Payer: Self-pay | Admitting: Cardiology

## 2022-02-15 ENCOUNTER — Other Ambulatory Visit: Payer: Self-pay | Admitting: Cardiology

## 2022-03-18 ENCOUNTER — Telehealth: Payer: Self-pay | Admitting: Family Medicine

## 2022-03-18 NOTE — Telephone Encounter (Signed)
Left message for patient to call back and schedule Medicare Annual Wellness Visit (AWV) in office.   If unable to come into the office for AWV,  please offer to do virtually or by telephone.  Last AWV: : 03/12/2021  Please schedule at anytime with [facility] Health Advisor.  30 minute appointment for Virtual or phone  45 minute appointment for in office or Initial virtual/phone  Any questions, please contact me at [phone]

## 2022-03-31 DIAGNOSIS — K909 Intestinal malabsorption, unspecified: Secondary | ICD-10-CM | POA: Insufficient documentation

## 2022-04-01 ENCOUNTER — Ambulatory Visit (INDEPENDENT_AMBULATORY_CARE_PROVIDER_SITE_OTHER): Payer: Medicare HMO

## 2022-04-01 VITALS — Ht 71.0 in | Wt 215.0 lb

## 2022-04-01 DIAGNOSIS — Z0000 Encounter for general adult medical examination without abnormal findings: Secondary | ICD-10-CM | POA: Diagnosis not present

## 2022-04-01 NOTE — Progress Notes (Signed)
Subjective:   Hector [name] is a 81 y.o. male who presents for Medicare Annual/Subsequent preventive examination. Virtual Visit via Telephone Note  I connected with  Hector [name] on 04/01/22 at 11:45 AM EDT by telephone and verified that I am speaking with the correct person using two identifiers.  Location: Patient: HOME Provider: [facility] Persons participating in the virtual visit: patient/Nurse Health Advisor   I discussed the limitations, risks, security and privacy concerns of performing an evaluation and management service by telephone and the availability of in person appointments. The patient expressed understanding and agreed to proceed.  Interactive audio and video telecommunications were attempted between this nurse and patient, however failed, due to patient having technical difficulties OR patient did not have access to video capability.  We continued and completed visit with audio only.  Some vital signs may be absent or patient reported.   [name], LPN  Review of Systems     Cardiac Risk Factors include: advanced age (>48mn, >>27women);hypertension;dyslipidemia;male gender;sedentary lifestyle     Objective:    Today's Vitals   04/01/22 1137  Weight: 215 lb (97.5 kg)  Height: _0  (1.803 m)   Body mass index is 29.99 kg/m.     04/01/2022   11:43 AM 11/20/2021   11:34 AM 09/12/2021   10:02 AM 08/23/2021    8:11 AM 03/12/2021    2:28 PM 02/15/2019    2:00 PM 02/09/2019    1:33 PM  Advanced Directives  Does Patient Have a Medical Advance Directive? _1  Yes Yes  Type of AParamedicof [place]Living will [place] will [place] SouthLiving will HG[place]Living will [place]Living will [place]  Does patient want to make changes to medical advance directive?  No - Patient declined No - Patient declined No -  Patient declined No - Patient declined No - Patient declined   Copy of [place] Chart? No - copy requested No - copy requested No - copy requested No - copy requested No - copy requested      Current Medications (verified) Outpatient Encounter Medications as of 04/01/2022  Medication Sig   amLODipine (NORVASC) 10 MG tablet TAKE 1 TABLET EVERY DAY   benazepril (LOTENSIN) 40 MG tablet TAKE 1 TABLET TWICE DAILY   calcium carbonate (TUMS - DOSED IN MG ELEMENTAL CALCIUM) 500 MG chewable tablet Chew 1 tablet by mouth as needed for indigestion or heartburn.   carvedilol (COREG) 12.5 MG tablet TAKE 1 TABLET TWICE DAILY   cloNIDine (CATAPRES) 0.1 MG tablet TAKE 1 TABLET TWICE DAILY   gemfibrozil (LOPID) 600 MG tablet TAKE 1 TABLET TWICE DAILY BEFORE MEALS   spironolactone (ALDACTONE) 25 MG tablet TAKE 1 TABLET EVERY DAY   No facility-administered encounter medications on file as of 04/01/2022.    Allergies (verified) Lipitor [atorvastatin] and Lisinopril   History: Past Medical History:  Diagnosis Date   Chronic hip pain    Left hip pain   ED (erectile dysfunction)    Essential hypertension    Hyperlipidemia    IFG (impaired fasting glucose)    Noncompliance    Normocytic anemia    Obesity    Osteoarthritis    Past Surgical History:  Procedure Laterality Date   BILATERAL KNEE ARTHROSCOPY     CHOLECYSTECTOMY  2010   COLONOSCOPY N/A 03/21/2015   Surgeon: RDaneil Dolin MD;  Multiple anal papilla-1 prominent, normal-appearing rectal mucosa, scattered left-sided diverticula, the remainder of the exam was normal.  No recommendations to repeat colonoscopy.   HIP FRACTURE SURGERY  2009   LEFT HIP   KNEE ARTHROTOMY BILATERAL 50 + YRS AGO     REFRACTIVE SURGERY Bilateral    LASIK   TOTAL HIP ARTHROPLASTY Left 07/24/2014   Procedure: CONVERSION OF PREVIOUS HIP SURGERY LEFT HIP ARTHROPLASTY;  Surgeon: Mauri Pole, MD;  Location: [facility];  Service: Orthopedics;   Laterality: Left;   TOTAL KNEE ARTHROPLASTY Right 02/15/2019   Procedure: TOTAL KNEE ARTHROPLASTY;  Surgeon: Paralee Cancel, MD;  Location: [facility];  Service: Orthopedics;  Laterality: Right;  70 mins   Family History  Problem Relation Age of Onset   Hypertension Mother    Diabetes Mother    Hypertension Father    Heart attack Father    Diabetes Sister    Colon cancer Neg Hx    Inflammatory bowel disease Neg Hx    Social History   Socioeconomic History   Marital status: Married    Spouse name: Blanch Media   Number of children: Not on file   Years of education: Not on file   Highest education level: Not on file  Occupational History   Not on file  Tobacco Use   Smoking status: Former    Types: Pipe    Quit date: 1963    Years since quitting: 60.5   Smokeless tobacco: Never   Tobacco comments:    quit 40-50 years ago  Vaping Use   Vaping Use: Never used  Substance and Sexual Activity   Alcohol use: No    Comment: QUIT SMOKING AGE 78   Drug use: No   Sexual activity: Yes  Other Topics Concern   Not on file  Social History Narrative   Married x 8 years to [place]. Dated in HS, married separate people and both divorced re-met after 19 years, married in 2015.   Social Determinants of Health   Financial Resource Strain: Low Risk  (04/01/2022)   Overall Financial Resource Strain (CARDIA)    Difficulty of Paying Living Expenses: Not hard at all  Food Insecurity: No Food Insecurity (04/01/2022)   Hunger Vital Sign    Worried About Running Out of Food in the Last Year: Never true    Ran Out of Food in the Last Year: Never true  Transportation Needs: No Transportation Needs (04/01/2022)   PRAPARE - Hydrologist (Medical): No    Lack of Transportation (Non-Medical): No  Physical Activity: Sufficiently Active (04/01/2022)   Exercise Vital Sign    Days of Exercise per Week: 5 days    Minutes of Exercise per Session: 30 min  Stress: No Stress Concern Present  (04/01/2022)   [place]    Feeling of Stress : Not at all  Social Connections: [place] (04/01/2022)   Social Connection and Isolation Panel [NHANES]    Frequency of Communication with Friends and Family: More than three times a week    Frequency of Social Gatherings with Friends and Family: More than three times a week    Attends Religious Services: More than 4 times per year    Active Member of [employer] or Organizations: Yes    Attends Music therapist: More than 4 times per year    Marital Status: Married    Tobacco Counseling Counseling given: Not Answered Tobacco comments:  quit 40-50 years ago   Clinical Intake:  Pre-visit preparation completed: Yes  Pain : No/denies pain     BMI - recorded: 29.99 Nutritional Status: BMI 25 -29 Overweight Nutritional Risks: None Diabetes: No  How often do you need to have someone help you when you read instructions, pamphlets, or other written materials from your doctor or pharmacy?: 1 - Never  Diabetic?NO  Interpreter Needed?: No  Information entered by :: mj Haelee Bolen, lpn   Activities of Daily Living    04/01/2022   11:47 AM  In your present state of health, do you have any difficulty performing the following activities:  Hearing? 0  Vision? 0  Difficulty concentrating or making decisions? 0  Walking or climbing stairs? 0  Dressing or bathing? 0  Doing errands, shopping? 0  Preparing Food and eating ? N  Using the Toilet? N  In the past six months, have you accidently leaked urine? N  Do you have problems with loss of bowel control? N  Managing your Medications? N  Managing your Finances? N  Housekeeping or managing your Housekeeping? N    Patient Care Team: Coral Spikes, DO as PCP - General (Family Medicine) Satira Sark, MD as PCP - Cardiology (Cardiology) Gala Romney Cristopher Estimable, MD as Consulting Physician  (Gastroenterology) Derek Jack, MD as Medical Oncologist (Hematology)  Indicate any recent Medical Services you may have received from other than [facility] providers in the past year (date may be approximate).     Assessment:   This is a routine wellness examination for Cristino.  Hearing/Vision screen Hearing Screening - Comments:: Wears hearing aids. Vision Screening - Comments:: Glasses. [facility]. Cataracts removed 2021.  Dietary issues and exercise activities discussed: Current Exercise Habits: Home exercise routine, Type of exercise: walking, Time (Minutes): 30, Frequency (Times/Week): 5, Weekly Exercise (Minutes/Week): 150, Intensity: Mild, Exercise limited by: cardiac condition(s)   Goals Addressed             This Visit's Progress    Weight (lb) < 200 lb (90.7 kg)   215 lb (97.5 kg)    04/01/2022-Continue to stay active and healthy.        Depression Screen    04/01/2022   11:41 AM 03/14/2021   10:02 AM 03/12/2021    2:31 PM 12/12/2020   11:35 AM 12/05/2019    1:48 PM 06/03/2018    9:40 AM 06/01/2017    9:49 AM  PHQ 2/9 Scores  PHQ - 2 Score 0 0 0 0 0 0 0    Fall Risk    04/01/2022   11:46 AM 03/14/2021   10:02 AM 03/12/2021    2:30 PM 12/12/2020   11:35 AM 06/14/2020   10:54 AM  Fall Risk   Falls in the past year? 0 0 0 0 0  Number falls in past yr: 0 0 0  0  Injury with Fall? 0 0 0  0  Risk for fall due to : No Fall Risks No Fall Risks No Fall Risks    Follow up Falls prevention discussed Falls evaluation completed Falls evaluation completed;Falls prevention discussed Falls evaluation completed Falls evaluation completed    FALL RISK PREVENTION PERTAINING TO THE HOME:  Any stairs in or around the home? Yes  If so, are there any without handrails? No  Home free of loose throw rugs in walkways, pet beds, electrical cords, etc? Yes  Adequate lighting in your home to reduce risk of falls? Yes   ASSISTIVE  DEVICES UTILIZED TO PREVENT FALLS:  Life  alert? No  Use of a cane, walker or w/c? No  Grab bars in the bathroom? Yes  Shower chair or bench in shower? Yes  Elevated toilet seat or a handicapped toilet? No   TIMED UP AND GO:  Was the test performed? Yes .  PHONE VISIT.  Cognitive Function:        04/01/2022   11:48 AM  6CIT Screen  What Year? 0 points  What month? 0 points  What time? 0 points  Count back from 20 0 points  Months in reverse 0 points  Repeat phrase 0 points  Total Score 0 points    Immunizations Immunization History  Administered Date(s) Administered   Influenza, High Dose Seasonal PF 06/24/2019   Influenza,inj,Quad PF,6+ Mos 05/28/2016, 06/01/2017, 06/03/2018   Influenza-Unspecified 07/13/2014, 07/16/2015, 06/06/2020   Moderna SARS-COV2 Booster Vaccination 06/27/2020   Moderna Sars-Covid-2 Vaccination 12/22/2019, 01/21/2020, 07/17/2020   Pneumococcal Conjugate-13 08/07/2015   Pneumococcal Polysaccharide-23 07/23/2012, 07/16/2015   Zoster Recombinat (Shingrix) 03/21/2020   Zoster, Live 08/14/2008    TDAP status: Due, Education has been provided regarding the importance of this vaccine. Advised may receive this vaccine at local pharmacy or Health Dept. Aware to provide a copy of the vaccination record if obtained from local pharmacy or Health Dept. Verbalized acceptance and understanding.  Flu Vaccine status: Due, Education has been provided regarding the importance of this vaccine. Advised may receive this vaccine at local pharmacy or Health Dept. Aware to provide a copy of the vaccination record if obtained from local pharmacy or Health Dept. Verbalized acceptance and understanding.  Pneumococcal vaccine status: Up to date  Covid-19 vaccine status: Completed vaccines  Qualifies for Shingles Vaccine? Yes   Zostavax completed Yes   Shingrix Completed?: No.    Education has been provided regarding the importance of this vaccine. Patient has been advised to call insurance company to determine  out of pocket expense if they have not yet received this vaccine. Advised may also receive vaccine at local pharmacy or Health Dept. Verbalized acceptance and understanding.  Screening Tests Health Maintenance  Topic Date Due   HEMOGLOBIN A1C  11/19/2020   COVID-19 Vaccine (3 - Moderna risk series) 04/17/2022 (Originally 08/14/2020)   TETANUS/TDAP  05/31/2022 (Originally 10/08/1959)   Hepatitis C Screening  09/19/2022 (Originally 10/08/1958)   Zoster Vaccines- Shingrix (2 of 2) 09/19/2022 (Originally 05/16/2020)   INFLUENZA VACCINE  04/22/2022   OPHTHALMOLOGY EXAM  09/20/2022   Pneumonia Vaccine 80+ Years old  Completed   HPV VACCINES  Aged Out   FOOT EXAM  Discontinued    Health Maintenance  Health Maintenance Due  Topic Date Due   HEMOGLOBIN A1C  11/19/2020    Colorectal cancer screening: No longer required.   Lung Cancer Screening: (Low Dose CT Chest recommended if Age 6-80 years, 30 pack-year currently smoking OR have quit w/in 15years.) does not qualify.   Additional Screening:  Hepatitis C Screening: does not qualify;   Vision Screening: Recommended annual ophthalmology exams for early detection of glaucoma and other disorders of the eye. Is the patient up to date with their annual eye exam?  Yes  Who is the provider or what is the name of the office in which the patient attends annual eye exams? [facility] If pt is not established with a provider, would they like to be referred to a provider to establish care? No .   Dental Screening: Recommended annual dental exams for proper  oral hygiene  Community Resource Referral / Chronic Care Management: CRR required this visit?  No   CCM required this visit?  No      Plan:     I have personally reviewed and noted the following in the patient's chart:   Medical and social history Use of alcohol, tobacco or illicit drugs  Current medications and supplements including opioid prescriptions. Patient is not currently  taking opioid prescriptions. Functional ability and status Nutritional status Physical activity Advanced directives List of other physicians Hospitalizations, surgeries, and ER visits in previous 12 months Vitals Screenings to include cognitive, depression, and falls Referrals and appointments  In addition, I have reviewed and discussed with patient certain preventive protocols, quality metrics, and best practice recommendations. A written personalized care plan for preventive services as well as general preventive health recommendations were provided to patient.     [name], LPN   02/18/510   Nurse Notes: Discussed Shingrix and how to obtain.

## 2022-04-01 NOTE — Patient Instructions (Addendum)
Hector [name] , Thank you for taking time to come for your Medicare Wellness Visit. I appreciate your ongoing commitment to your health goals. Please review the following plan we discussed and let me know if I can assist you in the future.   Screening recommendations/referrals: Colonoscopy: Done 03/11/2015. No follow up required.  Recommended yearly ophthalmology/optometry visit for glaucoma screening and checkup Recommended yearly dental visit for hygiene and checkup  Vaccinations: Influenza vaccine: Due Fall 2023. Pneumococcal vaccine: Done 08/07/2015, 07/16/2015 and 07/23/2012. Tdap vaccine: Due every 10 years. Shingles vaccine: Done 08/14/2008 and Shingrix done 03/21/2020. Covid-19: Done 06/27/2020, 01/21/2020 and 12/22/2019.  Advanced directives: Advance directive discussed with you today. Even though you declined this today, please call our office should you change your mind, and we can give you the proper paperwork for you to fill out.   Conditions/risks identified: Aim for 30 minutes of exercise or brisk walking, 6-8 glasses of water, and 5 servings of fruits and vegetables each day. KEEP UP THE GOOD WORK!!  Next appointment: Follow up in one year for your annual wellness visit in 2024. Please call [facility] to schedule annual physical with Dr. Everlene Other. Thank you!  Preventive Care 79 Years and Older, Male  Preventive care refers to lifestyle choices and visits with your health care provider that can promote health and wellness. What does preventive care include? A yearly physical exam. This is also called an annual well check. Dental exams once or twice a year. Routine eye exams. Ask your health care provider how often you should have your eyes checked. Personal lifestyle choices, including: Daily care of your teeth and gums. Regular physical activity. Eating a healthy diet. Avoiding tobacco and drug use. Limiting alcohol use. Practicing safe sex. Taking low doses of aspirin every  day. Taking vitamin and mineral supplements as recommended by your health care provider. What happens during an annual well check? The services and screenings done by your health care provider during your annual well check will depend on your age, overall health, lifestyle risk factors, and family history of disease. Counseling  Your health care provider may ask you questions about your: Alcohol use. Tobacco use. Drug use. Emotional well-being. Home and relationship well-being. Sexual activity. Eating habits. History of falls. Memory and ability to understand (cognition). Work and work Astronomer. Screening  You may have the following tests or measurements: Height, weight, and BMI. Blood pressure. Lipid and cholesterol levels. These may be checked every 5 years, or more frequently if you are over 87 years old. Skin check. Lung cancer screening. You may have this screening every year starting at age 59 if you have a 30-pack-year history of smoking and currently smoke or have quit within the past 15 years. Fecal occult blood test (FOBT) of the stool. You may have this test every year starting at age 67. Flexible sigmoidoscopy or colonoscopy. You may have a sigmoidoscopy every 5 years or a colonoscopy every 10 years starting at age 77. Prostate cancer screening. Recommendations will vary depending on your family history and other risks. Hepatitis C blood test. Hepatitis B blood test. Sexually transmitted disease (STD) testing. Diabetes screening. This is done by checking your blood sugar (glucose) after you have not eaten for a while (fasting). You may have this done every 1-3 years. Abdominal aortic aneurysm (AAA) screening. You may need this if you are a current or former smoker. Osteoporosis. You may be screened starting at age 66 if you are at high risk. Talk with your  health care provider about your test results, treatment options, and if necessary, the need for more  tests. Vaccines  Your health care provider may recommend certain vaccines, such as: Influenza vaccine. This is recommended every year. Tetanus, diphtheria, and acellular pertussis (Tdap, Td) vaccine. You may need a Td booster every 10 years. Zoster vaccine. You may need this after age 75. Pneumococcal 13-valent conjugate (PCV13) vaccine. One dose is recommended after age 13. Pneumococcal polysaccharide (PPSV23) vaccine. One dose is recommended after age 77. Talk to your health care provider about which screenings and vaccines you need and how often you need them. This information is not intended to replace advice given to you by your health care provider. Make sure you discuss any questions you have with your health care provider. Document Released: 10/05/2015 Document Revised: 05/28/2016 Document Reviewed: 07/10/2015 Elsevier Interactive Patient Education  2017 [employer] Prevention in the Home Falls can cause injuries. They can happen to people of all ages. There are many things you can do to make your home safe and to help prevent falls. What can I do on the outside of my home? Regularly fix the edges of walkways and driveways and fix any cracks. Remove anything that might make you trip as you walk through a door, such as a raised step or threshold. Trim any bushes or trees on the path to your home. Use bright outdoor lighting. Clear any walking paths of anything that might make someone trip, such as rocks or tools. Regularly check to see if handrails are loose or broken. Make sure that both sides of any steps have handrails. Any raised decks and porches should have guardrails on the edges. Have any leaves, snow, or ice cleared regularly. Use sand or salt on walking paths during winter. Clean up any spills in your garage right away. This includes oil or grease spills. What can I do in the bathroom? Use night lights. Install grab bars by the toilet and in the tub and shower.  Do not use towel bars as grab bars. Use non-skid mats or decals in the tub or shower. If you need to sit down in the shower, use a plastic, non-slip stool. Keep the floor dry. Clean up any water that spills on the floor as soon as it happens. Remove soap buildup in the tub or shower regularly. Attach bath mats securely with double-sided non-slip rug tape. Do not have throw rugs and other things on the floor that can make you trip. What can I do in the bedroom? Use night lights. Make sure that you have a light by your bed that is easy to reach. Do not use any sheets or blankets that are too big for your bed. They should not hang down onto the floor. Have a firm chair that has side arms. You can use this for support while you get dressed. Do not have throw rugs and other things on the floor that can make you trip. What can I do in the kitchen? Clean up any spills right away. Avoid walking on wet floors. Keep items that you use a lot in easy-to-reach places. If you need to reach something above you, use a strong step stool that has a grab bar. Keep electrical cords out of the way. Do not use floor polish or wax that makes floors slippery. If you must use wax, use non-skid floor wax. Do not have throw rugs and other things on the floor that can make you trip. What  can I do with my stairs? Do not leave any items on the stairs. Make sure that there are handrails on both sides of the stairs and use them. Fix handrails that are broken or loose. Make sure that handrails are as long as the stairways. Check any carpeting to make sure that it is firmly attached to the stairs. Fix any carpet that is loose or worn. Avoid having throw rugs at the top or bottom of the stairs. If you do have throw rugs, attach them to the floor with carpet tape. Make sure that you have a light switch at the top of the stairs and the bottom of the stairs. If you do not have them, ask someone to add them for you. What else  can I do to help prevent falls? Wear shoes that: Do not have high heels. Have rubber bottoms. Are comfortable and fit you well. Are closed at the toe. Do not wear sandals. If you use a stepladder: Make sure that it is fully opened. Do not climb a closed stepladder. Make sure that both sides of the stepladder are locked into place. Ask someone to hold it for you, if possible. Clearly mark and make sure that you can see: Any grab bars or handrails. First and last steps. Where the edge of each step is. Use tools that help you move around (mobility aids) if they are needed. These include: Canes. Walkers. Scooters. Crutches. Turn on the lights when you go into a dark area. Replace any light bulbs as soon as they burn out. Set up your furniture so you have a clear path. Avoid moving your furniture around. If any of your floors are uneven, fix them. If there are any pets around you, be aware of where they are. Review your medicines with your doctor. Some medicines can make you feel dizzy. This can increase your chance of falling. Ask your doctor what other things that you can do to help prevent falls. This information is not intended to replace advice given to you by your health care provider. Make sure you discuss any questions you have with your health care provider. Document Released: 07/05/2009 Document Revised: 02/14/2016 Document Reviewed: 10/13/2014 Elsevier Interactive Patient Education  2017 [employer].

## 2022-05-23 ENCOUNTER — Inpatient Hospital Stay: Payer: Medicare HMO | Attending: Hematology

## 2022-05-23 DIAGNOSIS — Z87891 Personal history of nicotine dependence: Secondary | ICD-10-CM | POA: Insufficient documentation

## 2022-05-23 DIAGNOSIS — R7989 Other specified abnormal findings of blood chemistry: Secondary | ICD-10-CM | POA: Diagnosis not present

## 2022-05-23 DIAGNOSIS — D649 Anemia, unspecified: Secondary | ICD-10-CM | POA: Insufficient documentation

## 2022-05-23 LAB — COMPREHENSIVE METABOLIC PANEL
ALT: 19 U/L (ref 0–44)
AST: 18 U/L (ref 15–41)
Albumin: 3.9 g/dL (ref 3.5–5.0)
Alkaline Phosphatase: 53 U/L (ref 38–126)
Anion gap: 8 (ref 5–15)
BUN: 21 mg/dL (ref 8–23)
CO2: 22 mmol/L (ref 22–32)
Calcium: 9.6 mg/dL (ref 8.9–10.3)
Chloride: 108 mmol/L (ref 98–111)
Creatinine, Ser: 1.1 mg/dL (ref 0.61–1.24)
GFR, Estimated: >60 mL/min (ref >60)
Glucose, Bld: 127 mg/dL — ABNORMAL HIGH (ref 70–99)
Potassium: 4.2 mmol/L (ref 3.5–5.1)
Sodium: 138 mmol/L (ref 135–145)
Total Bilirubin: 0.5 mg/dL (ref 0.3–1.2)
Total Protein: 7.2 g/dL (ref 6.5–8.1)

## 2022-05-23 LAB — IRON AND TIBC
Iron: 109 ug/dL (ref 45–182)
Saturation Ratios: 26 % (ref 17.9–39.5)
TIBC: 417 ug/dL (ref 250–450)
UIBC: 308 ug/dL

## 2022-05-23 LAB — CBC WITH DIFFERENTIAL/PLATELET
Abs Immature Granulocytes: 0.04 10*3/uL (ref 0.00–0.07)
Basophils Absolute: 0.1 10*3/uL (ref 0.0–0.1)
Basophils Relative: 1 %
Eosinophils Absolute: 0.7 10*3/uL — ABNORMAL HIGH (ref 0.0–0.5)
Eosinophils Relative: 13 %
HCT: 37.6 % — ABNORMAL LOW (ref 39.0–52.0)
Hemoglobin: 12.7 g/dL — ABNORMAL LOW (ref 13.0–17.0)
Immature Granulocytes: 1 %
Lymphocytes Relative: 29 %
Lymphs Abs: 1.6 10*3/uL (ref 0.7–4.0)
MCH: 31.3 pg (ref 26.0–34.0)
MCHC: 33.8 g/dL (ref 30.0–36.0)
MCV: 92.6 fL (ref 80.0–100.0)
Monocytes Absolute: 0.7 10*3/uL (ref 0.1–1.0)
Monocytes Relative: 13 %
Neutro Abs: 2.5 10*3/uL (ref 1.7–7.7)
Neutrophils Relative %: 43 %
Platelets: 261 10*3/uL (ref 150–400)
RBC: 4.06 MIL/uL — ABNORMAL LOW (ref 4.22–5.81)
RDW: 15.3 % (ref 11.5–15.5)
WBC: 5.6 10*3/uL (ref 4.0–10.5)
nRBC: 0.4 % — ABNORMAL HIGH (ref 0.0–0.2)

## 2022-05-23 LAB — FERRITIN: Ferritin: 390 ng/mL — ABNORMAL HIGH (ref 24–336)

## 2022-05-29 NOTE — Progress Notes (Signed)
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 male returns for routine follow-up of his elevated ferritin and normocytic anemia.  He was last seen by Dr. Delton Coombes on 11/20/2021.  At today's visit, he reports feeling well.  No recent hospitalizations, surgeries, or changes in baseline health status.  He  is not on any phlebotomy therapy, but will donate blood voluntarily from time to time (most recently about 6 months ago).  He denies any abnormal joint pain, abdominal pain, fatigue, decreased appetite, or skin changes.  He does not take any iron supplements at home.  He does not drink alcohol.  He eats red meat about once each week.  He denies any signs of blood loss such as epistaxis, hematemesis, hematochezia, or melena.  He has 100% energy and 100% appetite. He endorses that he is maintaining a stable weight.   REVIEW OF SYSTEMS:  Review of Systems  Constitutional:  Negative for appetite change, chills, diaphoresis, fatigue, fever and unexpected weight change.  HENT:   Negative for lump/mass and nosebleeds.   Eyes:  Negative for eye problems.  Respiratory:  Negative for cough, hemoptysis and shortness of breath.   Cardiovascular:  Negative for chest pain, leg swelling and palpitations.  Gastrointestinal:  Negative for abdominal pain, blood in stool, constipation, diarrhea, nausea and vomiting.  Genitourinary:  Negative for hematuria.   Skin: Negative.   Neurological:  Negative for dizziness, headaches and light-headedness.  Hematological:  Does not bruise/bleed easily.      PAST MEDICAL/SURGICAL HISTORY:  Past Medical History:  Diagnosis Date    Chronic hip pain    Left hip pain   ED (erectile dysfunction)    Essential hypertension    Hyperlipidemia    IFG (impaired fasting glucose)    Noncompliance    Normocytic anemia    Obesity    Osteoarthritis    Past Surgical History:  Procedure Laterality Date   BILATERAL KNEE ARTHROSCOPY     CHOLECYSTECTOMY  2010   COLONOSCOPY N/A 03/21/2015   Surgeon: Daneil Dolin, MD;  Multiple anal papilla-1 prominent, normal-appearing rectal mucosa, scattered left-sided diverticula, the remainder of the exam was normal.  No recommendations to repeat colonoscopy.   HIP FRACTURE SURGERY  2009   LEFT HIP   KNEE ARTHROTOMY BILATERAL 50 + YRS AGO     REFRACTIVE SURGERY Bilateral    LASIK   TOTAL HIP ARTHROPLASTY Left 07/24/2014   Procedure: CONVERSION OF PREVIOUS HIP SURGERY LEFT HIP ARTHROPLASTY;  Surgeon: Mauri Pole, MD;  Location: [facility];  Service: Orthopedics;  Laterality: Left;   TOTAL KNEE ARTHROPLASTY Right 02/15/2019   Procedure: TOTAL KNEE ARTHROPLASTY;  Surgeon: Paralee Cancel, MD;  Location: [facility];  Service: Orthopedics;  Laterality: Right;  70 mins     SOCIAL HISTORY:  Social History   Socioeconomic History   Marital status: Married    Spouse name: Blanch Media   Number of children: Not on file   Years of education: Not on file   Highest education level: Not on file  Occupational History   Not on file  Tobacco Use   Smoking status: Former    Types: Pipe  Quit date: 1963    Years since quitting: 60.7   Smokeless tobacco: Never   Tobacco comments:    quit 40-50 years ago  Vaping Use   Vaping Use: Never used  Substance and Sexual Activity   Alcohol use: No    Comment: QUIT SMOKING AGE 57   Drug use: No   Sexual activity: Yes  Other Topics Concern   Not on file  Social History Narrative   Married x 8 years to [place]. Dated in HS, married separate people and both divorced re-met after 61 years, married in 2015.   Social Determinants of Health   Financial Resource  Strain: Low Risk  (04/01/2022)   Overall Financial Resource Strain (CARDIA)    Difficulty of Paying Living Expenses: Not hard at all  Food Insecurity: No Food Insecurity (04/01/2022)   Hunger Vital Sign    Worried About Running Out of Food in the Last Year: Never true    Ran Out of Food in the Last Year: Never true  Transportation Needs: No Transportation Needs (04/01/2022)   PRAPARE - Hydrologist (Medical): No    Lack of Transportation (Non-Medical): No  Physical Activity: Sufficiently Active (04/01/2022)   Exercise Vital Sign    Days of Exercise per Week: 5 days    Minutes of Exercise per Session: 30 min  Stress: No Stress Concern Present (04/01/2022)   [place]    Feeling of Stress : Not at all  Social Connections: [place] (04/01/2022)   Social Connection and Isolation Panel [NHANES]    Frequency of Communication with Friends and Family: More than three times a week    Frequency of Social Gatherings with Friends and Family: More than three times a week    Attends Religious Services: More than 4 times per year    Active Member of [employer] or Organizations: Yes    Attends Music therapist: More than 4 times per year    Marital Status: Married  Human resources officer Violence: Not At Risk (04/01/2022)   Humiliation, Afraid, Rape, and Kick questionnaire    Fear of Current or Ex-Partner: No    Emotionally Abused: No    Physically Abused: No    Sexually Abused: No    FAMILY HISTORY:  Family History  Problem Relation Age of Onset   Hypertension Mother    Diabetes Mother    Hypertension Father    Heart attack Father    Diabetes Sister    Colon cancer Neg Hx    Inflammatory bowel disease Neg Hx     CURRENT MEDICATIONS:  Outpatient Encounter Medications as of 05/30/2022  Medication Sig   amLODipine (NORVASC) 10 MG tablet TAKE 1 TABLET EVERY DAY   benazepril (LOTENSIN) 40  MG tablet TAKE 1 TABLET TWICE DAILY   calcium carbonate (TUMS - DOSED IN MG ELEMENTAL CALCIUM) 500 MG chewable tablet Chew 1 tablet by mouth as needed for indigestion or heartburn.   carvedilol (COREG) 12.5 MG tablet TAKE 1 TABLET TWICE DAILY   cloNIDine (CATAPRES) 0.1 MG tablet TAKE 1 TABLET TWICE DAILY   gemfibrozil (LOPID) 600 MG tablet TAKE 1 TABLET TWICE DAILY BEFORE MEALS   spironolactone (ALDACTONE) 25 MG tablet TAKE 1 TABLET EVERY DAY   No facility-administered encounter medications on file as of 05/30/2022.    ALLERGIES:  Allergies  Allergen Reactions   Lipitor [Atorvastatin]     Muscle and joint pain   Lisinopril  Other reaction(s): Unknown     PHYSICAL EXAM:  ECOG PERFORMANCE STATUS: 0 - Asymptomatic  There were no vitals filed for this visit. There were no vitals filed for this visit. Physical Exam Constitutional:      Appearance: Normal appearance. He is obese.  HENT:     Head: Normocephalic and atraumatic.     Mouth/Throat:     Mouth: Mucous membranes are moist.  Eyes:     Extraocular Movements: Extraocular movements intact.     Pupils: Pupils are equal, round, and reactive to light.  Cardiovascular:     Rate and Rhythm: Normal rate and regular rhythm.     Pulses: Normal pulses.     Heart sounds: Normal heart sounds.  Pulmonary:     Effort: Pulmonary effort is normal.     Breath sounds: Normal breath sounds.  Abdominal:     General: Bowel sounds are normal.     Palpations: Abdomen is soft.     Tenderness: There is no abdominal tenderness.  Musculoskeletal:        General: No swelling.     Right lower leg: No edema.     Left lower leg: No edema.  Lymphadenopathy:     Cervical: No cervical adenopathy.  Skin:    General: Skin is warm and dry.  Neurological:     General: No focal deficit present.     Mental Status: He is alert and oriented to person, place, and time.  Psychiatric:        Mood and Affect: Mood normal.        Behavior: Behavior  normal.      LABORATORY DATA:  I have reviewed the labs as listed.  CBC    Component Value Date/Time   WBC 5.6 05/23/2022 0813   RBC 4.06 (L) 05/23/2022 0813   HGB 12.7 (L) 05/23/2022 0813   HGB 12.9 (L) 07/19/2021 0926   HCT 37.6 (L) 05/23/2022 0813   HCT 37.1 (L) 07/19/2021 0926   PLT 261 05/23/2022 0813   PLT 304 07/19/2021 0926   MCV 92.6 05/23/2022 0813   MCV 90 07/19/2021 0926   MCH 31.3 05/23/2022 0813   MCHC 33.8 05/23/2022 0813   RDW 15.3 05/23/2022 0813   RDW 15.1 07/19/2021 0926   LYMPHSABS 1.6 05/23/2022 0813   LYMPHSABS 1.6 07/19/2021 0926   MONOABS 0.7 05/23/2022 0813   EOSABS 0.7 (H) 05/23/2022 0813   EOSABS 0.7 (H) 07/19/2021 0926   BASOSABS 0.1 05/23/2022 0813   BASOSABS 0.1 07/19/2021 0926      Latest Ref Rng & Units 05/23/2022    8:13 AM 08/23/2021    8:51 AM 10/26/2020   10:31 AM  CMP  Glucose 70 - 99 mg/dL 127  108  98   BUN 8 - 23 mg/dL $Remove'21  18  13   'bwtbxfs$ Creatinine 0.61 - 1.24 mg/dL 1.10  0.97  1.07   Sodium 135 - 145 mmol/L 138  139  142   Potassium 3.5 - 5.1 mmol/L 4.2  3.7  4.0   Chloride 98 - 111 mmol/L 108  106  105   CO2 22 - 32 mmol/L $RemoveB'22  25  21   'luQNSsBj$ Calcium 8.9 - 10.3 mg/dL 9.6  9.0  9.3   Total Protein 6.5 - 8.1 g/dL 7.2  7.1    Total Bilirubin 0.3 - 1.2 mg/dL 0.5  0.7    Alkaline Phos 38 - 126 U/L 53  65    AST 15 - 41 U/L  18  24    ALT 0 - 44 U/L 19  17      DIAGNOSTIC IMAGING:  I have independently reviewed the relevant imaging and discussed with the patient.  ASSESSMENT & PLAN: 1.  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  However he was found to be anemic in August 2021 with hemoglobin 10.3.  His ferritin was 689 on 06/14/2020. - He was evaluated by GI and was started on iron pill around November 2021 through May 2022. - Elevated ferritin 689 (06/14/2020), 501 (10/26/2020), (587) 04/16/2021, 658 (07/05/2021).  Percent saturation varied from  22 to 48%. 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 - Ultrasound of the abdomen on 09/02/2021 showed hepatic steatosis. - Most recent labs (05/23/2022): Ferritin 390, iron saturation 26%.  LFTs normal - No symptoms of systemic iron overload - Elevated ferritin most likely multifactorial from fatty liver and compound heterozygosity for hemochromatosis - PLAN: No indication for phlebotomy or iron chelation at this time.  Patient can continue to donate blood voluntarily as long as hemoglobin >13.0. - Continue to recommend weight reduction by 10% - Continue to avoid iron supplements or high iron diet. - RTC in 6 months with repeat ferritin and iron panel.  2.  Normocytic anemia, mild - He has very mild anemia  - Work-up showed normal B12, MMA, copper, folic acid.  SPEP negative.  Elevated iron as above. - He reported that he used to donate blood at [facility].  However he was found to be anemic in August 2021 with hemoglobin 10.3.  His ferritin was 689 on 06/14/2020. - He was evaluated by GI and was started on iron pill around November 2021 through May 2022. - Colonoscopy on 03/21/2015 with chronic diverticulosis.  Stool for occult blood was negative previously. - Most recent labs (05/23/2022) with Hgb 12.7/MCV 92.6, normal WBC and platelets.  CMP unremarkable. - Early MDS or anemia of chronic disease is a possibility. - PLAN: Repeat CBC along with nutritional panel in 6 months. - Patient can continue to donate blood voluntarily as long as hemoglobin >13.0.  3.  Social/family history: - Lives at home with his wife.  He worked in Museum/gallery exhibitions officer.  No chemical exposure.  Non-smoker. - No family history of hemochromatosis.  No malignancies.   All questions were answered. The patient knows to call the clinic with any problems, questions or concerns.  Medical decision making: Low  Time spent on visit: I spent 15 minutes counseling the  patient face to face. The total time spent in the appointment was 25 minutes and more than 50% was on counseling.   Harriett Rush, PA-C  05/30/2022 8:34 AM

## 2022-05-30 ENCOUNTER — Inpatient Hospital Stay: Payer: Medicare HMO | Admitting: Physician Assistant

## 2022-05-30 VITALS — BP 144/75 | HR 52 | Temp 98.1°F | Ht 71.0 in | Wt 213.4 lb

## 2022-05-30 DIAGNOSIS — D649 Anemia, unspecified: Secondary | ICD-10-CM

## 2022-05-30 DIAGNOSIS — Z87891 Personal history of nicotine dependence: Secondary | ICD-10-CM | POA: Diagnosis not present

## 2022-05-30 DIAGNOSIS — R7989 Other specified abnormal findings of blood chemistry: Secondary | ICD-10-CM

## 2022-05-30 NOTE — Patient Instructions (Signed)
[facility] at [facility] **VISIT SUMMARY & IMPORTANT INSTRUCTIONS **   You were seen today by Rojelio Brenner PA-C for your follow-up visit.    ELEVATED IRON Your iron levels remain slightly elevated.  This is due to your underlying fatty liver disease as well as your genetic mutations for hemochromatosis. Avoid alcohol.  Do not take any iron supplements.  Limit red meat intake to once or twice each week. You can donate blood if you choose, as long as your hemoglobin is higher than 13.0.  RECOMMENDATIONS FOR FATTY LIVER DISEASE Recommend 1-2# weight loss per week until ideal body weight through exercise & diet. Low fat/cholesterol diet.   Avoid sweets, sodas, fruit juices, sweetened beverages like tea, etc. Gradually increase exercise from 15 min daily up to 1 hr per day 5 days/week. Limit alcohol use.  FOLLOW-UP APPOINTMENT: Labs and follow-up visit in 6 months  ** Thank you for trusting me with your healthcare!  I strive to provide all of my patients with quality care at each visit.  If you receive a survey for this visit, I would be so grateful to you for taking the time to provide feedback.  Thank you in advance!  ~ Addisen Chappelle                   Dr. Doreatha Massed   &   Rojelio Brenner, PA-C   - - - - - - - - - - - - - - - - - -    Thank you for choosing [facility] at [facility] to provide your oncology and hematology care.  To afford each patient quality time with our provider, please arrive at least 15 minutes before your scheduled appointment time.   If you have a lab appointment with the Cancer Center please come in thru the Main Entrance and check in at the main information desk.  You need to re-schedule your appointment should you arrive 10 or more minutes late.  We strive to give you quality time with our providers, and arriving late affects you and other patients whose appointments are after yours.  Also, if you no show  three or more times for appointments you may be dismissed from the clinic at the providers discretion.     Again, thank you for choosing [facility].  Our hope is that these requests will decrease the amount of time that you wait before being seen by our physicians.       _____________________________________________________________  Should you have questions after your visit to [facility], please contact our office at [phone] and follow the prompts.  Our office hours are 8:00 a.m. and 4:30 p.m. Monday - Friday.  Please note that voicemails left after 4:00 p.m. may not be returned until the following business day.  We are closed weekends and major holidays.  You do have access to a nurse 24-7, just call the main number to the clinic [phone] and do not press any options, hold on the line and a nurse will answer the phone.    For prescription refill requests, have your pharmacy contact our office and allow 72 hours.

## 2022-09-13 ENCOUNTER — Other Ambulatory Visit: Payer: Self-pay | Admitting: Cardiology

## 2022-09-25 ENCOUNTER — Other Ambulatory Visit: Payer: Self-pay | Admitting: Cardiology

## 2022-10-30 DIAGNOSIS — H524 Presbyopia: Secondary | ICD-10-CM | POA: Diagnosis not present

## 2022-11-21 ENCOUNTER — Inpatient Hospital Stay: Payer: Medicare HMO | Attending: Hematology

## 2022-11-21 DIAGNOSIS — Z87891 Personal history of nicotine dependence: Secondary | ICD-10-CM | POA: Insufficient documentation

## 2022-11-21 DIAGNOSIS — K760 Fatty (change of) liver, not elsewhere classified: Secondary | ICD-10-CM | POA: Insufficient documentation

## 2022-11-21 DIAGNOSIS — I10 Essential (primary) hypertension: Secondary | ICD-10-CM | POA: Insufficient documentation

## 2022-11-21 DIAGNOSIS — R7989 Other specified abnormal findings of blood chemistry: Secondary | ICD-10-CM | POA: Insufficient documentation

## 2022-11-21 DIAGNOSIS — D649 Anemia, unspecified: Secondary | ICD-10-CM | POA: Insufficient documentation

## 2022-11-21 DIAGNOSIS — Z79899 Other long term (current) drug therapy: Secondary | ICD-10-CM | POA: Insufficient documentation

## 2022-11-28 ENCOUNTER — Inpatient Hospital Stay: Payer: Medicare HMO | Admitting: Physician Assistant

## 2022-11-28 ENCOUNTER — Other Ambulatory Visit (HOSPITAL_COMMUNITY)
Admission: RE | Admit: 2022-11-28 | Discharge: 2022-11-28 | Disposition: A | Discharge status: Home / Self Care | Payer: Medicare HMO | Source: Ambulatory Visit | Attending: Physician Assistant | Admitting: Physician Assistant

## 2022-11-28 ENCOUNTER — Inpatient Hospital Stay: Payer: Medicare HMO

## 2022-11-28 DIAGNOSIS — R7989 Other specified abnormal findings of blood chemistry: Secondary | ICD-10-CM | POA: Insufficient documentation

## 2022-11-28 DIAGNOSIS — D649 Anemia, unspecified: Secondary | ICD-10-CM | POA: Insufficient documentation

## 2022-11-28 LAB — COMPREHENSIVE METABOLIC PANEL
ALT: 27 U/L (ref 0–44)
AST: 26 U/L (ref 15–41)
Albumin: 3.7 g/dL (ref 3.5–5.0)
Alkaline Phosphatase: 52 U/L (ref 38–126)
Anion gap: 9 (ref 5–15)
BUN: 21 mg/dL (ref 8–23)
CO2: 22 mmol/L (ref 22–32)
Calcium: 9.2 mg/dL (ref 8.9–10.3)
Chloride: 107 mmol/L (ref 98–111)
Creatinine, Ser: 1.11 mg/dL (ref 0.61–1.24)
GFR, Estimated: >60 mL/min (ref >60)
Glucose, Bld: 134 mg/dL — ABNORMAL HIGH (ref 70–99)
Potassium: 3.5 mmol/L (ref 3.5–5.1)
Sodium: 138 mmol/L (ref 135–145)
Total Bilirubin: 0.5 mg/dL (ref 0.3–1.2)
Total Protein: 6.9 g/dL (ref 6.5–8.1)

## 2022-11-28 LAB — CBC WITH DIFFERENTIAL/PLATELET
Abs Immature Granulocytes: 0.03 10*3/uL (ref 0.00–0.07)
Basophils Absolute: 0 10*3/uL (ref 0.0–0.1)
Basophils Relative: 1 %
Eosinophils Absolute: 0.4 10*3/uL (ref 0.0–0.5)
Eosinophils Relative: 8 %
HCT: 37 % — ABNORMAL LOW (ref 39.0–52.0)
Hemoglobin: 12.4 g/dL — ABNORMAL LOW (ref 13.0–17.0)
Immature Granulocytes: 1 %
Lymphocytes Relative: 27 %
Lymphs Abs: 1.3 10*3/uL (ref 0.7–4.0)
MCH: 31.4 pg (ref 26.0–34.0)
MCHC: 33.5 g/dL (ref 30.0–36.0)
MCV: 93.7 fL (ref 80.0–100.0)
Monocytes Absolute: 0.6 10*3/uL (ref 0.1–1.0)
Monocytes Relative: 13 %
Neutro Abs: 2.5 10*3/uL (ref 1.7–7.7)
Neutrophils Relative %: 50 %
Platelets: 273 10*3/uL (ref 150–400)
RBC: 3.95 MIL/uL — ABNORMAL LOW (ref 4.22–5.81)
RDW: 15.6 % — ABNORMAL HIGH (ref 11.5–15.5)
WBC: 4.9 10*3/uL (ref 4.0–10.5)
nRBC: 0.4 % — ABNORMAL HIGH (ref 0.0–0.2)

## 2022-11-28 LAB — IRON AND TIBC
Iron: 134 ug/dL (ref 45–182)
Saturation Ratios: 32 % (ref 17.9–39.5)
TIBC: 419 ug/dL (ref 250–450)
UIBC: 285 ug/dL

## 2022-11-28 LAB — FERRITIN: Ferritin: 270 ng/mL (ref 24–336)

## 2022-12-04 ENCOUNTER — Other Ambulatory Visit: Payer: Self-pay | Admitting: Cardiology

## 2022-12-05 ENCOUNTER — Inpatient Hospital Stay (HOSPITAL_BASED_OUTPATIENT_CLINIC_OR_DEPARTMENT_OTHER): Payer: Medicare HMO | Admitting: Physician Assistant

## 2022-12-05 VITALS — BP 204/90 | HR 53 | Temp 98.0°F | Resp 17 | Ht 71.0 in | Wt 215.3 lb

## 2022-12-05 DIAGNOSIS — K760 Fatty (change of) liver, not elsewhere classified: Secondary | ICD-10-CM | POA: Diagnosis not present

## 2022-12-05 DIAGNOSIS — Z87891 Personal history of nicotine dependence: Secondary | ICD-10-CM | POA: Diagnosis not present

## 2022-12-05 DIAGNOSIS — I10 Essential (primary) hypertension: Secondary | ICD-10-CM | POA: Diagnosis not present

## 2022-12-05 DIAGNOSIS — Z79899 Other long term (current) drug therapy: Secondary | ICD-10-CM | POA: Diagnosis not present

## 2022-12-05 DIAGNOSIS — D649 Anemia, unspecified: Secondary | ICD-10-CM

## 2022-12-05 DIAGNOSIS — R7989 Other specified abnormal findings of blood chemistry: Secondary | ICD-10-CM

## 2022-12-05 NOTE — Patient Instructions (Signed)
[place] at [facility] **   You were seen today by Tarri Abernethy PA-C for your follow-up visit.    ELEVATED IRON Your iron levels look much better. Avoid alcohol.  Do not take any iron supplements.  Limit red meat intake to once or twice each week. You can donate blood if you choose, as long as your hemoglobin is higher than 13.0.  RECOMMENDATIONS FOR FATTY LIVER DISEASE Recommend 1-2# weight loss per week until ideal body weight through exercise & diet. Low fat/cholesterol diet.   Avoid sweets, sodas, fruit juices, sweetened beverages like tea, etc. Gradually increase exercise from 15 min daily up to 1 hr per day 5 days/week. Limit alcohol use.  FOLLOW-UP APPOINTMENT: Labs and follow-up visit in 6 months  ** Thank you for trusting me with your healthcare!  I strive to provide all of my patients with quality care at each visit.  If you receive a survey for this visit, I would be so grateful to you for taking the time to provide feedback.  Thank you in advance!  ~ Kyree Adriano                   Dr. Derek Jack   &   Tarri Abernethy, PA-C   - - - - - - - - - - - - - - - - - -    Thank you for choosing [place] at [facility] to provide your oncology and hematology care.  To afford each patient quality time with our provider, please arrive at least 15 minutes before your scheduled appointment time.   If you have a lab appointment with the [facility] please come in thru the Main Entrance and check in at the main information desk.  You need to re-schedule your appointment should you arrive 10 or more minutes late.  We strive to give you quality time with our providers, and arriving late affects you and other patients whose appointments are after yours.  Also, if you no show three or more times for appointments you may be dismissed from the clinic at the providers discretion.     Again,  thank you for choosing [facility].  Our hope is that these requests will decrease the amount of time that you wait before being seen by our physicians.       _____________________________________________________________  Should you have questions after your visit to [facility], please contact our office at [phone] and follow the prompts.  Our office hours are 8:00 a.m. and 4:30 p.m. Monday - Friday.  Please note that voicemails left after 4:00 p.m. may not be returned until the following business day.  We are closed weekends and major holidays.  You do have access to a nurse 24-7, just call the main number to the clinic [phone] and do not press any options, hold on the line and a nurse will answer the phone.    For prescription refill requests, have your pharmacy contact our office and allow 72 hours.

## 2022-12-05 NOTE — Progress Notes (Signed)
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 82 y.o. male returns for routine follow-up of elevated ferritin and normocytic anemia.  He was last seen by Tarri Abernethy PA-C on 05/30/2022.   At today's visit, he reports feeling well.  No recent hospitalizations, surgeries, or changes in baseline health status.   He  is not on any phlebotomy therapy, but will donate blood voluntarily from time to time (most recently about one year ago).  He denies any abnormal joint pain, abdominal pain, fatigue, decreased appetite, or skin changes.  He does not take any iron supplements at home.  He does not drink alcohol.  He eats red meat about once each week.  He denies any signs of blood loss such as epistaxis, hematemesis, hematochezia, or melena.  He has 100% energy and 100% appetite. He endorses that he is maintaining a stable weight.  ASSESSMENT & PLAN:  1.  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  However he was found to be anemic in August 2021 with hemoglobin 10.3.  His ferritin was 689 on 06/14/2020. - He was evaluated by GI and was started on iron pill around November 2021 through May 2022. - Elevated ferritin 689 (06/14/2020), 501 (10/26/2020), 587 (04/16/2021), 658 (07/05/2021).  Percent saturation varied from 22 to 48%. 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 - Ultrasound of the abdomen on 09/02/2021 showed hepatic steatosis. - Most recent labs (11/28/2022): Ferritin 270, iron saturation 32%.  LFTs normal. - No symptoms of  systemic iron overload - Elevated ferritin most likely multifactorial from fatty liver and compound heterozygosity for hemochromatosis - PLAN: No indication for phlebotomy or iron chelation at this time.  Patient can continue to donate blood voluntarily as long as hemoglobin >13.0. - Continue to recommend weight reduction by 10% - Continue to avoid iron supplements or high iron diet. - RTC in 6 months with repeat ferritin and iron panel.   2.  Normocytic anemia, mild - He has very mild anemia  - Work-up showed normal B12, MMA, copper, folic acid.  SPEP negative.  Elevated iron as above. - He reported that he used to donate blood at [facility].  However he was found to be anemic in August 2021 with hemoglobin 10.3.  His ferritin was 689 on 06/14/2020. - He was evaluated by GI and was started on iron pill around November 2021 through May 2022. - Colonoscopy on 03/21/2015 with chronic diverticulosis.  Stool for occult blood was negative previously. - Most recent labs (11/28/2022): Hgb 12.4/MCV 93.7, normal WBC/platelets/differential, mildly elevated nRBC 0.4. - Early MDS or anemia of chronic disease is a possibility. - PLAN: Continue monitoring with CBC in 6 months.   - Patient can continue to donate blood voluntarily as long as hemoglobin >13.0.   3.  Severe hypertension - Blood pressure today 204/90 (third check) - Patient is asymptomatic; no headache, dizziness, blurry vision, strokelike symptoms, chest pain, or dyspnea  - Patient reports that baseline SBP runs around 140-150 - Patient takes several home blood pressure medications (amlodipine, benazepril, carvedilol,  clonidine, spironolactone) - has not been able to get his amlodipine refilled from the pharmacy and ran out about a week ago. - PLAN: We were able to send patient home with a 10-day supply of his amlodipine. -- Patient's PCP was informed, with instructions given for patient to call PCP office and schedule follow-up visit ASAP -  Patient instructed to repeat blood pressure at home 30 minutes after taking his amlodipine, and to contact PCP if blood pressure is greater than 180/90.  Instructed on alarm symptoms that would prompt 911 call and immediate medical attention.  4.  Social/family history: - Lives at home with his wife and handles most of the household tasks due to her heart disease.  He worked in Museum/gallery exhibitions officer.  No chemical exposure.  Non-smoker. - No family history of hemochromatosis.  No malignancies.  PLAN SUMMARY: >> Labs in 6 months = CBC + ferritin + iron/TIBC >> OFFICE visit in 6 months     REVIEW OF SYSTEMS:   Review of Systems  Constitutional:  Negative for appetite change, chills, diaphoresis, fatigue, fever and unexpected weight change.  HENT:   Negative for lump/mass and nosebleeds.   Eyes:  Negative for eye problems.  Respiratory:  Negative for cough, hemoptysis and shortness of breath.   Cardiovascular:  Negative for chest pain, leg swelling and palpitations.  Gastrointestinal:  Negative for abdominal pain, blood in stool, constipation, diarrhea, nausea and vomiting.  Genitourinary:  Positive for frequency. Negative for hematuria.   Skin: Negative.   Neurological:  Negative for dizziness, headaches and light-headedness.  Hematological:  Does not bruise/bleed easily.     PHYSICAL EXAM:  ECOG PERFORMANCE STATUS: 0 - Asymptomatic  There were no vitals filed for this visit. There were no vitals filed for this visit. Physical Exam Constitutional:      Appearance: Normal appearance. He is obese.  Cardiovascular:     Heart sounds: Normal heart sounds.  Pulmonary:     Breath sounds: Normal breath sounds.  Neurological:     General: No focal deficit present.     Mental Status: Mental status is at baseline.  Psychiatric:        Behavior: Behavior normal. Behavior is cooperative.     PAST MEDICAL/SURGICAL HISTORY:  Past Medical History:  Diagnosis Date    Chronic hip pain    Left hip pain   ED (erectile dysfunction)    Essential hypertension    Hyperlipidemia    IFG (impaired fasting glucose)    Noncompliance    Normocytic anemia    Obesity    Osteoarthritis    Past Surgical History:  Procedure Laterality Date   BILATERAL KNEE ARTHROSCOPY     CHOLECYSTECTOMY  2010   COLONOSCOPY N/A 03/21/2015   Surgeon: Daneil Dolin, MD;  Multiple anal papilla-1 prominent, normal-appearing rectal mucosa, scattered left-sided diverticula, the remainder of the exam was normal.  No recommendations to repeat colonoscopy.   HIP FRACTURE SURGERY  2009   LEFT HIP   KNEE ARTHROTOMY BILATERAL 50 + YRS AGO     REFRACTIVE SURGERY Bilateral    LASIK   TOTAL HIP ARTHROPLASTY Left 07/24/2014   Procedure: CONVERSION OF PREVIOUS HIP SURGERY LEFT HIP ARTHROPLASTY;  Surgeon: Mauri Pole, MD;  Location: [facility];  Service: Orthopedics;  Laterality: Left;   TOTAL KNEE ARTHROPLASTY Right 02/15/2019   Procedure: TOTAL KNEE ARTHROPLASTY;  Surgeon: Paralee Cancel, MD;  Location: [facility];  Service: Orthopedics;  Laterality: Right;  70 mins  SOCIAL HISTORY:  Social History   Socioeconomic History   Marital status: Married    Spouse name: Blanch Media   Number of children: Not on file   Years of education: Not on file   Highest education level: Not on file  Occupational History   Not on file  Tobacco Use   Smoking status: Former    Types: Pipe    Quit date: 1963    Years since quitting: 61.2   Smokeless tobacco: Never   Tobacco comments:    quit 40-50 years ago  Vaping Use   Vaping Use: Never used  Substance and Sexual Activity   Alcohol use: No    Comment: QUIT SMOKING AGE 45   Drug use: No   Sexual activity: Yes  Other Topics Concern   Not on file  Social History Narrative   Married x 8 years to [place]. Dated in HS, married separate people and both divorced re-met after 31 years, married in 2015.   Social Determinants of Health   Financial Resource Strain:  Low Risk  (04/01/2022)   Overall Financial Resource Strain (CARDIA)    Difficulty of Paying Living Expenses: Not hard at all  Food Insecurity: No Food Insecurity (04/01/2022)   Hunger Vital Sign    Worried About Running Out of Food in the Last Year: Never true    Ran Out of Food in the Last Year: Never true  Transportation Needs: No Transportation Needs (04/01/2022)   PRAPARE - Hydrologist (Medical): No    Lack of Transportation (Non-Medical): No  Physical Activity: Sufficiently Active (04/01/2022)   Exercise Vital Sign    Days of Exercise per Week: 5 days    Minutes of Exercise per Session: 30 min  Stress: No Stress Concern Present (04/01/2022)   [place]    Feeling of Stress : Not at all  Social Connections: [place] (04/01/2022)   Social Connection and Isolation Panel [NHANES]    Frequency of Communication with Friends and Family: More than three times a week    Frequency of Social Gatherings with Friends and Family: More than three times a week    Attends Religious Services: More than 4 times per year    Active Member of [employer] or Organizations: Yes    Attends Music therapist: More than 4 times per year    Marital Status: Married  Human resources officer Violence: Not At Risk (04/01/2022)   Humiliation, Afraid, Rape, and Kick questionnaire    Fear of Current or Ex-Partner: No    Emotionally Abused: No    Physically Abused: No    Sexually Abused: No    FAMILY HISTORY:  Family History  Problem Relation Age of Onset   Hypertension Mother    Diabetes Mother    Hypertension Father    Heart attack Father    Diabetes Sister    Colon cancer Neg Hx    Inflammatory bowel disease Neg Hx     CURRENT MEDICATIONS:  Outpatient Encounter Medications as of 12/05/2022  Medication Sig   amLODipine (NORVASC) 10 MG tablet TAKE 1 TABLET EVERY DAY   benazepril (LOTENSIN) 40 MG  tablet TAKE 1 TABLET TWICE DAILY   calcium carbonate (TUMS - DOSED IN MG ELEMENTAL CALCIUM) 500 MG chewable tablet Chew 1 tablet by mouth as needed for indigestion or heartburn.   carvedilol (COREG) 12.5 MG tablet TAKE 1 TABLET TWICE DAILY   cloNIDine (CATAPRES) 0.1  MG tablet TAKE 1 TABLET TWICE DAILY   gemfibrozil (LOPID) 600 MG tablet TAKE 1 TABLET TWICE DAILY BEFORE MEALS   loperamide (IMODIUM A-D) 2 MG tablet 1 tablet as needed Orally Four times a day   spironolactone (ALDACTONE) 25 MG tablet TAKE 1 TABLET EVERY DAY   No facility-administered encounter medications on file as of 12/05/2022.    ALLERGIES:  Allergies  Allergen Reactions   Lipitor [Atorvastatin]     Muscle and joint pain   Lisinopril     Other reaction(s): Unknown    LABORATORY DATA:  I have reviewed the labs as listed.  CBC    Component Value Date/Time   WBC 4.9 11/28/2022 0852   RBC 3.95 (L) 11/28/2022 0852   HGB 12.4 (L) 11/28/2022 0852   HGB 12.9 (L) 07/19/2021 0926   HCT 37.0 (L) 11/28/2022 0852   HCT 37.1 (L) 07/19/2021 0926   PLT 273 11/28/2022 0852   PLT 304 07/19/2021 0926   MCV 93.7 11/28/2022 0852   MCV 90 07/19/2021 0926   MCH 31.4 11/28/2022 0852   MCHC 33.5 11/28/2022 0852   RDW 15.6 (H) 11/28/2022 0852   RDW 15.1 07/19/2021 0926   LYMPHSABS 1.3 11/28/2022 0852   LYMPHSABS 1.6 07/19/2021 0926   MONOABS 0.6 11/28/2022 0852   EOSABS 0.4 11/28/2022 0852   EOSABS 0.7 (H) 07/19/2021 0926   BASOSABS 0.0 11/28/2022 0852   BASOSABS 0.1 07/19/2021 0926      Latest Ref Rng & Units 11/28/2022    8:52 AM 05/23/2022    8:13 AM 08/23/2021    8:51 AM  CMP  Glucose 70 - 99 mg/dL 134  127  108   BUN 8 - 23 mg/dL 21  21  18    Creatinine 0.61 - 1.24 mg/dL 1.11  1.10  0.97   Sodium 135 - 145 mmol/L 138  138  139   Potassium 3.5 - 5.1 mmol/L 3.5  4.2  3.7   Chloride 98 - 111 mmol/L 107  108  106   CO2 22 - 32 mmol/L 22  22  25    Calcium 8.9 - 10.3 mg/dL 9.2  9.6  9.0   Total Protein 6.5 - 8.1 g/dL 6.9   7.2  7.1   Total Bilirubin 0.3 - 1.2 mg/dL 0.5  0.5  0.7   Alkaline Phos 38 - 126 U/L 52  53  65   AST 15 - 41 U/L 26  18  24    ALT 0 - 44 U/L 27  19  17      DIAGNOSTIC IMAGING:  I have independently reviewed the relevant imaging and discussed with the patient.   WRAP UP:  All questions were answered. The patient knows to call the clinic with any problems, questions or concerns.  Medical decision making: Moderate  Time spent on visit: I spent 20 minutes counseling the patient face to face. The total time spent in the appointment was 30 minutes and more than 50% was on counseling.  Harriett Rush, PA-C  12/05/2022 10:45 AM

## 2022-12-08 ENCOUNTER — Other Ambulatory Visit: Payer: Self-pay

## 2022-12-08 DIAGNOSIS — D649 Anemia, unspecified: Secondary | ICD-10-CM

## 2022-12-08 DIAGNOSIS — R7989 Other specified abnormal findings of blood chemistry: Secondary | ICD-10-CM

## 2022-12-10 ENCOUNTER — Ambulatory Visit (INDEPENDENT_AMBULATORY_CARE_PROVIDER_SITE_OTHER): Payer: Medicare HMO | Admitting: Family Medicine

## 2022-12-10 ENCOUNTER — Ambulatory Visit: Payer: Medicare HMO | Admitting: Physician Assistant

## 2022-12-10 DIAGNOSIS — I10 Essential (primary) hypertension: Secondary | ICD-10-CM

## 2022-12-10 MED ORDER — AMLODIPINE BESYLATE 10 MG PO TABS: ORAL_TABLET | ORAL | 1 refills | Status: DC

## 2022-12-10 NOTE — Progress Notes (Signed)
Subjective:  Patient ID: Hector [name], male    DOB: 16-Dec-1940  Age: 82 y.o. MRN: [id number]  CC: Chief Complaint  Patient presents with   BP elevation    Headaches     HPI:  82 year old male with hypertension, elevated ferritin secondary to heterozygosity for hemochromatosis, mild normocytic anemia, mixed hyperlipidemia presents for evaluation of the above.  Patient is new to me.  Recently seen by hematology/oncology and was found to have a markedly elevated blood pressure of 204/90.  He had recently been out of his amlodipine.  This was sent into the pharmacy.  Patient was encouraged to return to primary care for follow-up regarding this.  Patient has had some ongoing headaches which she has presumed to be related to his elevated blood pressure.  He is currently on amlodipine 10 mg daily, benazepril 40 mg daily, carvedilol 12.5 mg twice daily, clonidine 0.1 mg twice daily, and spironolactone 25 mg daily.  He has previously been followed by cardiology.  No chest pain or shortness of breath.  Patient Active Problem List   Diagnosis Date Noted   Hemochromatosis 12/10/2022   Anemia 10/24/2020   S/P right TKA 02/15/2019   Diverticulosis of colon without hemorrhage    Essential hypertension, benign 01/18/2013   Hyperlipemia, mixed 01/18/2013   Sensory neuropathy 01/18/2013    Social Hx   Social History   Socioeconomic History   Marital status: Married    Spouse name: Blanch Media   Number of children: Not on file   Years of education: Not on file   Highest education level: Not on file  Occupational History   Not on file  Tobacco Use   Smoking status: Former    Types: Pipe    Quit date: 1963    Years since quitting: 61.2   Smokeless tobacco: Never   Tobacco comments:    quit 40-50 years ago  Vaping Use   Vaping Use: Never used  Substance and Sexual Activity   Alcohol use: No    Comment: QUIT SMOKING AGE 40   Drug use: No   Sexual activity: Yes  Other Topics Concern    Not on file  Social History Narrative   Married x 8 years to [place]. Dated in HS, married separate people and both divorced re-met after 58 years, married in 2015.   Social Determinants of Health   Financial Resource Strain: Low Risk  (04/01/2022)   Overall Financial Resource Strain (CARDIA)    Difficulty of Paying Living Expenses: Not hard at all  Food Insecurity: No Food Insecurity (04/01/2022)   Hunger Vital Sign    Worried About Running Out of Food in the Last Year: Never true    Ran Out of Food in the Last Year: Never true  Transportation Needs: No Transportation Needs (04/01/2022)   PRAPARE - Hydrologist (Medical): No    Lack of Transportation (Non-Medical): No  Physical Activity: Sufficiently Active (04/01/2022)   Exercise Vital Sign    Days of Exercise per Week: 5 days    Minutes of Exercise per Session: 30 min  Stress: No Stress Concern Present (04/01/2022)   [place]    Feeling of Stress : Not at all  Social Connections: [place] (04/01/2022)   Social Connection and Isolation Panel [NHANES]    Frequency of Communication with Friends and Family: More than three times a week    Frequency of Social Gatherings with Friends  and Family: More than three times a week    Attends Religious Services: More than 4 times per year    Active Member of Clubs or Organizations: Yes    Attends Music therapist: More than 4 times per year    Marital Status: Married    Review of Systems Per HPI  Objective:  BP (!) 151/75   Pulse (!) 50   Temp 98.6 F (37 C)   Ht 5\' 11"  (1.803 m)   Wt 211 lb (95.7 kg)   SpO2 97%   BMI 29.43 kg/m      12/10/2022    9:25 AM 12/10/2022    9:04 AM 12/05/2022    9:34 AM  BP/Weight  Systolic BP 123XX123 [id number]  Diastolic BP 75 80 90  Wt. (Lbs)  211 215.3  BMI  29.43 kg/m2 30.03 kg/m2    Physical Exam Vitals and nursing note reviewed.   Constitutional:      General: He is not in acute distress.    Appearance: Normal appearance.  HENT:     Head: Normocephalic and atraumatic.  Eyes:     General:        Right eye: No discharge.        Left eye: No discharge.     Conjunctiva/sclera: Conjunctivae normal.  Cardiovascular:     Rate and Rhythm: Regular rhythm. Bradycardia present.  Pulmonary:     Effort: Pulmonary effort is normal.     Breath sounds: Normal breath sounds.  Neurological:     Mental Status: He is alert.  Psychiatric:        Mood and Affect: Mood normal.        Behavior: Behavior normal.     Lab Results  Component Value Date   WBC 4.9 11/28/2022   HGB 12.4 (L) 11/28/2022   HCT 37.0 (L) 11/28/2022   PLT 273 11/28/2022   GLUCOSE 134 (H) 11/28/2022   CHOL 107 05/22/2020   TRIG 81 05/22/2020   HDL 32 (L) 05/22/2020   LDLCALC 59 05/22/2020   ALT 27 11/28/2022   AST 26 11/28/2022   NA 138 11/28/2022   K 3.5 11/28/2022   CL 107 11/28/2022   CREATININE 1.11 11/28/2022   BUN 21 11/28/2022   CO2 22 11/28/2022   TSH 2.590 10/26/2020   PSA 3.34 04/18/2014   INR 1.10 07/18/2014   HGBA1C 6.0 (H) 05/22/2020     Assessment & Plan:   Problem List Items Addressed This Visit       Cardiovascular and Mediastinum   Essential hypertension, benign    Patient has had prior workup for secondary causes which was negative.  I have spoken with cardiology regarding this.  BP control fair today.  Amlodipine refilled.  Patient is to continue his other antihypertensives.  Follow-up in 1 month.      Relevant Medications   amLODipine (NORVASC) 10 MG tablet    Meds ordered this encounter  Medications   amLODipine (NORVASC) 10 MG tablet    Sig: TAKE 1 TABLET EVERY DAY    Dispense:  90 tablet    Refill:  1    Follow-up:  Return in about 1 month (around 01/10/2023) for HTN follow up.  [place]

## 2022-12-10 NOTE — Assessment & Plan Note (Signed)
Patient has had prior workup for secondary causes which was negative.  I have spoken with cardiology regarding this.  BP control fair today.  Amlodipine refilled.  Patient is to continue his other antihypertensives.  Follow-up in 1 month.

## 2022-12-10 NOTE — Patient Instructions (Signed)
Continue your medications.  Amlodipine was refilled today.  Bring your medications in with you when you return.  Follow up in 1 month.

## 2023-01-12 ENCOUNTER — Encounter: Payer: Self-pay | Admitting: Family Medicine

## 2023-01-12 ENCOUNTER — Ambulatory Visit (INDEPENDENT_AMBULATORY_CARE_PROVIDER_SITE_OTHER): Payer: Medicare HMO | Admitting: Family Medicine

## 2023-01-12 VITALS — BP 167/79 | HR 44 | Temp 97.3°F | Ht 71.0 in | Wt 216.0 lb

## 2023-01-12 DIAGNOSIS — I1A0 Resistant hypertension: Secondary | ICD-10-CM | POA: Diagnosis not present

## 2023-01-12 NOTE — Patient Instructions (Addendum)
Take the clonidine - 2 Tablets twice daily (this is an increase).  I am reaching out to someone about the benazepril.  Follow up in 1 month.

## 2023-01-12 NOTE — Progress Notes (Signed)
Subjective:  Patient ID: Hector [name], male    DOB: 10-10-1940  Age: 82 y.o. MRN: [id number]  CC: Chief Complaint  Patient presents with   Hypertension    HPI:  82 year old male with the below mentioned medical history presents for follow-up regarding hypertension.  BP significantly elevated here.  He endorses compliance with his medications and brings them in with him today.  He is on amlodipine 10 mg daily, carvedilol 12.5 mg twice daily, clonidine 0.1 mg twice daily, and spironolactone 25 mg daily.  He is also taking benazepril 40 mg (he is taking this twice daily; prescribed by cardiology).  No chest pain or SOB. No dizziness.   Patient Active Problem List   Diagnosis Date Noted   Resistant hypertension 01/12/2023   Hemochromatosis 12/10/2022   Anemia 10/24/2020   S/P right TKA 02/15/2019   Diverticulosis of colon without hemorrhage    Hyperlipemia, mixed 01/18/2013   Sensory neuropathy 01/18/2013    Social Hx   Social History   Socioeconomic History   Marital status: Married    Spouse name: Alona Bene   Number of children: Not on file   Years of education: Not on file   Highest education level: 12th grade  Occupational History   Not on file  Tobacco Use   Smoking status: Former    Types: Pipe    Quit date: 1963    Years since quitting: 61.3   Smokeless tobacco: Never   Tobacco comments:    quit 40-50 years ago  Vaping Use   Vaping Use: Never used  Substance and Sexual Activity   Alcohol use: No    Comment: QUIT SMOKING AGE 55   Drug use: No   Sexual activity: Yes  Other Topics Concern   Not on file  Social History Narrative   Married x 8 years to [place]. Dated in HS, married separate people and both divorced re-met after 52 years, married in 2015.   Social Determinants of Health   Financial Resource Strain: Low Risk  (01/08/2023)   Overall Financial Resource Strain (CARDIA)    Difficulty of Paying Living Expenses: Not very hard  Food Insecurity: No  Food Insecurity (01/08/2023)   Hunger Vital Sign    Worried About Running Out of Food in the Last Year: Never true    Ran Out of Food in the Last Year: Never true  Transportation Needs: No Transportation Needs (01/08/2023)   PRAPARE - Administrator, Civil Service (Medical): No    Lack of Transportation (Non-Medical): No  Physical Activity: Insufficiently Active (01/08/2023)   Exercise Vital Sign    Days of Exercise per Week: 2 days    Minutes of Exercise per Session: 20 min  Stress: No Stress Concern Present (01/08/2023)   [employer] of Occupational Health - Occupational Stress Questionnaire    Feeling of Stress : Only a little  Social Connections: Socially Integrated (01/08/2023)   Social Connection and Isolation Panel [NHANES]    Frequency of Communication with Friends and Family: Three times a week    Frequency of Social Gatherings with Friends and Family: Once a week    Attends Religious Services: More than 4 times per year    Active Member of [employer] or Organizations: Yes    Attends Engineer, structural: More than 4 times per year    Marital Status: Married    Review of Systems Per HPI  Objective:  BP (!) 167/79   Pulse (!) 44  Temp (!) 97.3 F (36.3 C)   Ht  (1.803 m)   Wt 216 lb (98 kg)   SpO2 98%   BMI 30.13 kg/m      01/12/2023    9:39 AM 01/12/2023    9:07 AM 12/10/2022    9:25 AM  BP/Weight  Systolic BP 167 194 151  Diastolic BP 79 78 75  Wt. (Lbs)  216   BMI  30.13 kg/m2     Physical Exam Vitals and nursing note reviewed.  Constitutional:      General: He is not in acute distress.    Appearance: Normal appearance.  Cardiovascular:     Rate and Rhythm: Regular rhythm. Bradycardia present.  Pulmonary:     Effort: Pulmonary effort is normal.     Breath sounds: Normal breath sounds. No wheezing, rhonchi or rales.  Neurological:     Mental Status: He is alert.  Psychiatric:        Mood and Affect: Mood normal.         Behavior: Behavior normal.     Lab Results  Component Value Date   WBC 4.9 11/28/2022   HGB 12.4 (L) 11/28/2022   HCT 37.0 (L) 11/28/2022   PLT 273 11/28/2022   GLUCOSE 134 (H) 11/28/2022   CHOL 107 05/22/2020   TRIG 81 05/22/2020   HDL 32 (L) 05/22/2020   LDLCALC 59 05/22/2020   ALT 27 11/28/2022   AST 26 11/28/2022   NA 138 11/28/2022   K 3.5 11/28/2022   CL 107 11/28/2022   CREATININE 1.11 11/28/2022   BUN 21 11/28/2022   CO2 22 11/28/2022   TSH 2.590 10/26/2020   PSA 3.34 04/18/2014   INR 1.10 07/18/2014   HGBA1C 6.0 (H) 05/22/2020     Assessment & Plan:   Problem List Items Addressed This Visit       Cardiovascular and Mediastinum   Resistant hypertension - Primary    Uncontrolled/worse from prior.  Increasing Clonidine to 0.2 mg BID (he will use his current prescription of 0.1 mg). Will reach out to cardiology regarding Benazepril.  Follow up in 1 month.        Follow-up:  Return in about 1 month (around 02/11/2023) for HTN follow up.  Everlene Other DO [facility]

## 2023-01-12 NOTE — Assessment & Plan Note (Signed)
Uncontrolled/worse from prior.  Increasing Clonidine to 0.2 mg BID (he will use his current prescription of 0.1 mg). Will reach out to cardiology regarding Benazepril.  Follow up in 1 month.

## 2023-01-28 ENCOUNTER — Other Ambulatory Visit: Payer: Self-pay | Admitting: Cardiology

## 2023-02-11 ENCOUNTER — Ambulatory Visit (INDEPENDENT_AMBULATORY_CARE_PROVIDER_SITE_OTHER): Payer: Medicare HMO | Admitting: Family Medicine

## 2023-02-11 ENCOUNTER — Encounter: Payer: Self-pay | Admitting: Family Medicine

## 2023-02-11 VITALS — BP 121/64 | HR 45 | Temp 97.0°F | Ht 71.0 in | Wt 214.0 lb

## 2023-02-11 DIAGNOSIS — I1A0 Resistant hypertension: Secondary | ICD-10-CM

## 2023-02-11 DIAGNOSIS — G629 Polyneuropathy, unspecified: Secondary | ICD-10-CM

## 2023-02-11 MED ORDER — CLONIDINE HCL 0.2 MG PO TABS: 0.2000 mg | ORAL_TABLET | Freq: Two times a day (BID) | ORAL | 1 refills | Status: DC

## 2023-02-11 MED ORDER — PREGABALIN 75 MG PO CAPS: 75.0000 mg | ORAL_CAPSULE | Freq: Two times a day (BID) | ORAL | 1 refills | Status: DC

## 2023-02-11 MED ORDER — CARVEDILOL 6.25 MG PO TABS: 6.2500 mg | ORAL_TABLET | Freq: Two times a day (BID) | ORAL | 1 refills | Status: DC

## 2023-02-11 MED ORDER — BENAZEPRIL HCL 40 MG PO TABS: 40.0000 mg | ORAL_TABLET | Freq: Every day | ORAL | 3 refills | Status: DC

## 2023-02-11 NOTE — Assessment & Plan Note (Signed)
Uncontrolled/worsening.  Trial of Lyrica.

## 2023-02-11 NOTE — Progress Notes (Signed)
Subjective:  Patient ID: Hector [name], male    DOB: 1940/10/25  Age: 82 y.o. MRN: [id number]  CC: Chief Complaint  Patient presents with   Hypertension   right foot pain and neuropathy    HPI:  82 year old male with a mentioned medical history presents for follow-up regarding hypertension.  Patient's blood pressure is exceptionally well-controlled today.  However, he is bradycardic.  We will discuss his medications and make changes today.  He is feeling well.  No chest pain or shortness of breath.  No significant fatigue.  Patient reports issues with neuropathy.  He is currently bothered by significant pain to the lateral aspect of his right foot.  He is interested in pharmacotherapy to help with his discomfort.  Patient Active Problem List   Diagnosis Date Noted   Resistant hypertension 01/12/2023   Hemochromatosis 12/10/2022   Anemia 10/24/2020   S/P right TKA 02/15/2019   Diverticulosis of colon without hemorrhage    Hyperlipemia, mixed 01/18/2013   Sensory neuropathy 01/18/2013    Social Hx   Social History   Socioeconomic History   Marital status: Married    Spouse name: Alona Bene   Number of children: Not on file   Years of education: Not on file   Highest education level: 12th grade  Occupational History   Not on file  Tobacco Use   Smoking status: Former    Types: Pipe    Quit date: 1963    Years since quitting: 61.4   Smokeless tobacco: Never   Tobacco comments:    quit 40-50 years ago  Vaping Use   Vaping Use: Never used  Substance and Sexual Activity   Alcohol use: No    Comment: QUIT SMOKING AGE 43   Drug use: No   Sexual activity: Yes  Other Topics Concern   Not on file  Social History Narrative   Married x 8 years to [place]. Dated in HS, married separate people and both divorced re-met after 52 years, married in 2015.   Social Determinants of Health   Financial Resource Strain: Low Risk  (01/08/2023)   Overall Financial Resource Strain  (CARDIA)    Difficulty of Paying Living Expenses: Not very hard  Food Insecurity: No Food Insecurity (01/08/2023)   Hunger Vital Sign    Worried About Running Out of Food in the Last Year: Never true    Ran Out of Food in the Last Year: Never true  Transportation Needs: No Transportation Needs (01/08/2023)   PRAPARE - Administrator, Civil Service (Medical): No    Lack of Transportation (Non-Medical): No  Physical Activity: Insufficiently Active (01/08/2023)   Exercise Vital Sign    Days of Exercise per Week: 2 days    Minutes of Exercise per Session: 20 min  Stress: No Stress Concern Present (01/08/2023)   [employer] of Occupational Health - Occupational Stress Questionnaire    Feeling of Stress : Only a little  Social Connections: Socially Integrated (01/08/2023)   Social Connection and Isolation Panel [NHANES]    Frequency of Communication with Friends and Family: Three times a week    Frequency of Social Gatherings with Friends and Family: Once a week    Attends Religious Services: More than 4 times per year    Active Member of [employer] or Organizations: Yes    Attends Engineer, structural: More than 4 times per year    Marital Status: Married    Review of Systems Per HPI  Objective:  BP 121/64   Pulse (!) 45   Temp (!) 97 F (36.1 C)   Ht 5\' 11"  (1.803 m)   Wt 214 lb (97.1 kg)   SpO2 96%   BMI 29.85 kg/m      02/11/2023    8:34 AM 01/12/2023    9:39 AM 01/12/2023    9:07 AM  BP/Weight  Systolic BP 121 167 194  Diastolic BP 64 79 78  Wt. (Lbs) 214  216  BMI 29.85 kg/m2  30.13 kg/m2    Physical Exam Vitals and nursing note reviewed.  Constitutional:      General: He is not in acute distress.    Appearance: Normal appearance.  Cardiovascular:     Rate and Rhythm: Regular rhythm. Bradycardia present.  Pulmonary:     Effort: Pulmonary effort is normal.     Breath sounds: Normal breath sounds. No wheezing, rhonchi or rales.  Neurological:      Mental Status: He is alert.  Psychiatric:        Mood and Affect: Mood normal.        Behavior: Behavior normal.     Lab Results  Component Value Date   WBC 4.9 11/28/2022   HGB 12.4 (L) 11/28/2022   HCT 37.0 (L) 11/28/2022   PLT 273 11/28/2022   GLUCOSE 134 (H) 11/28/2022   CHOL 107 05/22/2020   TRIG 81 05/22/2020   HDL 32 (L) 05/22/2020   LDLCALC 59 05/22/2020   ALT 27 11/28/2022   AST 26 11/28/2022   NA 138 11/28/2022   K 3.5 11/28/2022   CL 107 11/28/2022   CREATININE 1.11 11/28/2022   BUN 21 11/28/2022   CO2 22 11/28/2022   TSH 2.590 10/26/2020   PSA 3.34 04/18/2014   INR 1.10 07/18/2014   HGBA1C 6.0 (H) 05/22/2020     Assessment & Plan:   Problem List Items Addressed This Visit       Cardiovascular and Mediastinum   Resistant hypertension - Primary    Due to bradycardia, I am decreasing the carvedilol to 6.25 mg twice daily.  For some reason he is on benazepril 40 mg twice daily.  I am decreasing this to 40 mg once daily.  He is to continue his amlodipine and he is to continue his spironolactone.  New prescription for clonidine has been sent.      Relevant Medications   carvedilol (COREG) 6.25 MG tablet   benazepril (LOTENSIN) 40 MG tablet   cloNIDine (CATAPRES) 0.2 MG tablet     Nervous and Auditory   Sensory neuropathy    Uncontrolled/worsening.  Trial of Lyrica.       Meds ordered this encounter  Medications   carvedilol (COREG) 6.25 MG tablet    Sig: Take 1 tablet (6.25 mg total) by mouth in the morning and at bedtime.    Dispense:  180 tablet    Refill:  1   benazepril (LOTENSIN) 40 MG tablet    Sig: Take 1 tablet (40 mg total) by mouth daily.    Dispense:  90 tablet    Refill:  3   cloNIDine (CATAPRES) 0.2 MG tablet    Sig: Take 1 tablet (0.2 mg total) by mouth 2 (two) times daily.    Dispense:  180 tablet    Refill:  1   pregabalin (LYRICA) 75 MG capsule    Sig: Take 1 capsule (75 mg total) by mouth 2 (two) times daily.     Dispense:  60  capsule    Refill:  1    Follow-up: 1 month  Renn Dirocco DO [facility]

## 2023-02-11 NOTE — Assessment & Plan Note (Signed)
Due to bradycardia, I am decreasing the carvedilol to 6.25 mg twice daily.  For some reason he is on benazepril 40 mg twice daily.  I am decreasing this to 40 mg once daily.  He is to continue his amlodipine and he is to continue his spironolactone.  New prescription for clonidine has been sent.

## 2023-02-11 NOTE — Patient Instructions (Addendum)
Carvedilol 6.25 mg twice daily (you are going to cut your current pills in half until the new prescription comes).  Benazepril 40 mg ONCE daily.  Clonidine as prescribed.  New medication as prescribed.  Follow up in 1 month.

## 2023-02-12 DIAGNOSIS — W19XXXA Unspecified fall, initial encounter: Secondary | ICD-10-CM | POA: Diagnosis not present

## 2023-02-12 DIAGNOSIS — D649 Anemia, unspecified: Secondary | ICD-10-CM | POA: Diagnosis not present

## 2023-02-12 DIAGNOSIS — I440 Atrioventricular block, first degree: Secondary | ICD-10-CM | POA: Diagnosis not present

## 2023-02-12 DIAGNOSIS — I959 Hypotension, unspecified: Secondary | ICD-10-CM | POA: Diagnosis not present

## 2023-02-12 DIAGNOSIS — R55 Syncope and collapse: Secondary | ICD-10-CM | POA: Diagnosis not present

## 2023-02-12 DIAGNOSIS — R7989 Other specified abnormal findings of blood chemistry: Secondary | ICD-10-CM | POA: Diagnosis not present

## 2023-02-12 DIAGNOSIS — R001 Bradycardia, unspecified: Secondary | ICD-10-CM | POA: Diagnosis not present

## 2023-02-12 DIAGNOSIS — G629 Polyneuropathy, unspecified: Secondary | ICD-10-CM | POA: Diagnosis not present

## 2023-02-12 DIAGNOSIS — I700 Atherosclerosis of aorta: Secondary | ICD-10-CM | POA: Diagnosis not present

## 2023-02-12 DIAGNOSIS — I10 Essential (primary) hypertension: Secondary | ICD-10-CM | POA: Diagnosis not present

## 2023-02-12 DIAGNOSIS — R42 Dizziness and giddiness: Secondary | ICD-10-CM | POA: Diagnosis not present

## 2023-02-12 DIAGNOSIS — I2510 Atherosclerotic heart disease of native coronary artery without angina pectoris: Secondary | ICD-10-CM | POA: Diagnosis not present

## 2023-02-19 ENCOUNTER — Other Ambulatory Visit: Payer: Self-pay | Admitting: Cardiology

## 2023-02-19 DIAGNOSIS — H16223 Keratoconjunctivitis sicca, not specified as Sjogren's, bilateral: Secondary | ICD-10-CM | POA: Diagnosis not present

## 2023-02-19 DIAGNOSIS — H353131 Nonexudative age-related macular degeneration, bilateral, early dry stage: Secondary | ICD-10-CM | POA: Diagnosis not present

## 2023-03-03 ENCOUNTER — Encounter: Payer: Self-pay | Admitting: *Deleted

## 2023-03-05 DIAGNOSIS — R55 Syncope and collapse: Secondary | ICD-10-CM | POA: Diagnosis not present

## 2023-03-06 ENCOUNTER — Encounter: Payer: Self-pay | Admitting: Cardiology

## 2023-03-13 ENCOUNTER — Encounter: Payer: Self-pay | Admitting: Family Medicine

## 2023-03-13 ENCOUNTER — Ambulatory Visit (INDEPENDENT_AMBULATORY_CARE_PROVIDER_SITE_OTHER): Payer: Medicare HMO | Admitting: Family Medicine

## 2023-03-13 VITALS — BP 146/76 | HR 47 | Ht 71.0 in | Wt 214.0 lb

## 2023-03-13 DIAGNOSIS — I1A0 Resistant hypertension: Secondary | ICD-10-CM

## 2023-03-13 NOTE — Patient Instructions (Signed)
Lab today.  Continue your medications.  Stay hydrated.

## 2023-03-14 LAB — BASIC METABOLIC PANEL
BUN/Creatinine Ratio: 16 (ref 10–24)
BUN: 16 mg/dL (ref 8–27)
CO2: 21 mmol/L (ref 20–29)
Calcium: 9.3 mg/dL (ref 8.6–10.2)
Chloride: 105 mmol/L (ref 96–106)
Creatinine, Ser: 0.98 mg/dL (ref 0.76–1.27)
Glucose: 98 mg/dL (ref 70–99)
Potassium: 4.6 mmol/L (ref 3.5–5.2)
Sodium: 139 mmol/L (ref 134–144)
eGFR: 78 mL/min/{1.73_m2} (ref >59)

## 2023-03-15 NOTE — Progress Notes (Signed)
Subjective:  Patient ID: Hector [name], male    DOB: 07-11-1941  Age: 82 y.o. MRN: [id number]  CC: Chief Complaint  Patient presents with   Hypertension   Follow-up    HPI:  82 year old male with resistant hypertension, heterozygosity for hemochromatosis, mild normocytic anemia (being followed by hematology), and neuropathy presents for follow-up.  Patient recently had a syncopal episode.  Lyrica has been stopped.  Syncope likely vasovagal per ER visit.  Patient states that he is doing well currently.  He discontinued Lyrica in case this was a potentially causative factor for his syncope.  Currently no chest pain or shortness of breath.  Continues to be bradycardic.  Carvedilol has been decreased.  BP mildly elevated here today.  However, this is at an acceptable goal of <150/90 given his age.   Patient Active Problem List   Diagnosis Date Noted   Resistant hypertension 01/12/2023   Hemochromatosis 12/10/2022   Anemia 10/24/2020   S/P right TKA 02/15/2019   Diverticulosis of colon without hemorrhage    Hyperlipemia, mixed 01/18/2013   Sensory neuropathy 01/18/2013    Social Hx   Social History   Socioeconomic History   Marital status: Married    Spouse name: Alona Bene   Number of children: Not on file   Years of education: Not on file   Highest education level: 12th grade  Occupational History   Not on file  Tobacco Use   Smoking status: Former    Types: Pipe    Quit date: 1963    Years since quitting: 61.5   Smokeless tobacco: Never   Tobacco comments:    quit 40-50 years ago  Vaping Use   Vaping Use: Never used  Substance and Sexual Activity   Alcohol use: No    Comment: QUIT SMOKING AGE 45   Drug use: No   Sexual activity: Yes  Other Topics Concern   Not on file  Social History Narrative   Married x 8 years to [place]. Dated in HS, married separate people and both divorced re-met after 52 years, married in 2015.   Social Determinants of Health    Financial Resource Strain: Low Risk  (01/08/2023)   Overall Financial Resource Strain (CARDIA)    Difficulty of Paying Living Expenses: Not very hard  Food Insecurity: No Food Insecurity (01/08/2023)   Hunger Vital Sign    Worried About Running Out of Food in the Last Year: Never true    Ran Out of Food in the Last Year: Never true  Transportation Needs: No Transportation Needs (01/08/2023)   PRAPARE - Administrator, Civil Service (Medical): No    Lack of Transportation (Non-Medical): No  Physical Activity: Insufficiently Active (01/08/2023)   Exercise Vital Sign    Days of Exercise per Week: 2 days    Minutes of Exercise per Session: 20 min  Stress: No Stress Concern Present (01/08/2023)   [employer] of Occupational Health - Occupational Stress Questionnaire    Feeling of Stress : Only a little  Social Connections: Socially Integrated (01/08/2023)   Social Connection and Isolation Panel [NHANES]    Frequency of Communication with Friends and Family: Three times a week    Frequency of Social Gatherings with Friends and Family: Once a week    Attends Religious Services: More than 4 times per year    Active Member of [employer] or Organizations: Yes    Attends Banker Meetings: More than 4 times per year  Marital Status: Married    Review of Systems  Constitutional: Negative.   Cardiovascular: Negative.    Objective:  BP (!) 146/76   Pulse (!) 47   Ht 5\' 11"  (1.803 m)   Wt 214 lb (97.1 kg)   SpO2 96%   BMI 29.85 kg/m      03/13/2023   10:55 AM 02/11/2023    8:34 AM 01/12/2023    9:39 AM  BP/Weight  Systolic BP 146 121 167  Diastolic BP 76 64 79  Wt. (Lbs) 214 214   BMI 29.85 kg/m2 29.85 kg/m2     Physical Exam Vitals and nursing note reviewed.  Constitutional:      General: He is not in acute distress.    Appearance: Normal appearance.  Cardiovascular:     Rate and Rhythm: Regular rhythm. Bradycardia present.  Pulmonary:     Effort:  Pulmonary effort is normal.     Breath sounds: Normal breath sounds. No wheezing or rales.  Neurological:     Mental Status: He is alert.  Psychiatric:     Comments: Flat affect.      Lab Results  Component Value Date   WBC 4.9 11/28/2022   HGB 12.4 (L) 11/28/2022   HCT 37.0 (L) 11/28/2022   PLT 273 11/28/2022   GLUCOSE 98 03/13/2023   CHOL 107 05/22/2020   TRIG 81 05/22/2020   HDL 32 (L) 05/22/2020   LDLCALC 59 05/22/2020   ALT 27 11/28/2022   AST 26 11/28/2022   NA 139 03/13/2023   K 4.6 03/13/2023   CL 105 03/13/2023   CREATININE 0.98 03/13/2023   BUN 16 03/13/2023   CO2 21 03/13/2023   TSH 2.590 10/26/2020   PSA 3.34 04/18/2014   INR 1.10 07/18/2014   HGBA1C 6.0 (H) 05/22/2020     Assessment & Plan:   Problem List Items Addressed This Visit       Cardiovascular and Mediastinum   Resistant hypertension - Primary    Fair control.  Continue current medications.  Patient creatinine recently elevated after review of ER note from recent syncopal episode.  Rechecking today.      Relevant Orders   Basic Metabolic Panel (Completed)   Follow-up:  Return in about 3 months (around 06/13/2023).  Everlene Other DO [facility]

## 2023-03-15 NOTE — Assessment & Plan Note (Signed)
Fair control.  Continue current medications.  Patient creatinine recently elevated after review of ER note from recent syncopal episode.  Rechecking today.

## 2023-04-01 ENCOUNTER — Encounter: Payer: Self-pay | Admitting: Cardiology

## 2023-04-01 ENCOUNTER — Ambulatory Visit: Payer: Medicare HMO | Attending: Cardiology | Admitting: Cardiology

## 2023-04-01 VITALS — BP 126/68 | HR 48 | Ht 71.0 in | Wt 212.8 lb

## 2023-04-01 DIAGNOSIS — R001 Bradycardia, unspecified: Secondary | ICD-10-CM

## 2023-04-01 DIAGNOSIS — I10 Essential (primary) hypertension: Secondary | ICD-10-CM

## 2023-04-01 DIAGNOSIS — E782 Mixed hyperlipidemia: Secondary | ICD-10-CM

## 2023-04-01 DIAGNOSIS — I2510 Atherosclerotic heart disease of native coronary artery without angina pectoris: Secondary | ICD-10-CM

## 2023-04-01 DIAGNOSIS — Z87898 Personal history of other specified conditions: Secondary | ICD-10-CM

## 2023-04-01 MED ORDER — CARVEDILOL 3.125 MG PO TABS: 3.1250 mg | ORAL_TABLET | Freq: Two times a day (BID) | ORAL | 3 refills | Status: DC

## 2023-04-01 NOTE — Progress Notes (Signed)
Cardiology Office Note  Date: 04/01/2023   ID: Jalani, Rominger [birth year], MRN [id number]  History of Present Illness: Hector [name] is an 82 y.o. male last seen in November 2022.  He presents for a post ER follow-up, seen at [facility] in late May after episode of syncope.  He described the episode to me today.  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  He had started Lyrica very recently as well.  No acute findings on ER assessment and he was told to stop Lyrica.  Since then he has not had any further symptoms.  14-day ZIO provided by [facility] in June reporting sinus rhythm with heart rate ranging from 38 bpm up to 141 bpm and average heart rate 51 bpm.  Prolonged PR interval and IVCD noted.  There were several brief episodes of PSVT, the longest of which was only 14 beats.  No high degree heart block or pauses however.  Chest CTA done at [facility] in late June showed no evidence of pulmonary embolus.  Three-vessel distribution coronary calcification demonstrated as well as aortic atherosclerosis.  We went over his medications today.  Discussed reducing Coreg to 3.125 mg twice daily for now.  Also discussed implications of coronary artery calcification by CT imaging.  He has prior history of myalgias on Lipitor.  Most recently on gemfibrozil.  No recent lipid panel however.  Physical Exam: VS:  BP 126/68   Pulse (!) 48   Ht 5\' 11"  (1.803 m)   Wt 212 lb 12.8 oz (96.5 kg)   SpO2 92%   BMI 29.68 kg/m , BMI Body mass index is 29.68 kg/m.  Wt Readings from Last 3 Encounters:  04/01/23 212 lb 12.8 oz (96.5 kg)  03/13/23 214 lb (97.1 kg)  02/11/23 214 lb (97.1 kg)    General: Patient appears comfortable at rest. HEENT: Conjunctiva and lids normal. Neck: Supple, no elevated JVP or carotid bruits. Lungs: Clear to auscultation, nonlabored  breathing at rest. Cardiac: Regular rate and rhythm, no S3 or significant systolic murmur.  ECG:  An ECG dated 02/12/2023 was personally reviewed today and demonstrated:  Sinus bradycardia with prolonged PR interval and right bundle branch block.  Labwork: 11/28/2022: ALT 27; AST 26; Hemoglobin 12.4; Platelets 273 03/13/2023: BUN 16; Creatinine, Ser 0.98; Potassium 4.6; Sodium 139     Component Value Date/Time   CHOL 107 05/22/2020 0830   TRIG 81 05/22/2020 0830   HDL 32 (L) 05/22/2020 0830   CHOLHDL 3.3 05/22/2020 0830   CHOLHDL 4.3 10/31/2014 0944   VLDL 35 10/31/2014 0944   LDLCALC 59 05/22/2020 0830   Other Studies Reviewed Today:  No interval cardiac testing for review.  Assessment and Plan:  1.  Episode of syncope back in May as discussed above.  Chronically bradycardic, but this has not been clearly symptomatic before and subsequent cardiac monitor did not demonstrate any pauses or high degree heart block.  Episode could have been vagally mediated potentially.  We will reduce his Coreg to 3.125 mg twice daily empirically for now and continue observation.  2.  Essential hypertension.  Also on Norvasc, Lotensin, Aldactone and clonidine.  Blood pressure well-controlled today.  3.  History of sinus bradycardia and conduction system disease.  Empirically reduce Coreg to 3.125 mg twice daily.  If this does ultimately become clearly symptomatic then would stop  all AV nodal blockers.  He has required multimodal therapy for treatment of hypertension.  4.  Incidentally noted coronary artery calcification and aortic atherosclerosis by CT imaging.  We discussed this today.  Had prior statin myalgias with Lipitor and has been on gemfibrozil most recently.  Last LDL 59 in 2021.  Check FLP and determine if further adjustments are necessary.  Disposition:  Follow up  6 months, sooner if needed.  Signed, Hector [name], M.D., F.A.C.C. [facility]

## 2023-04-01 NOTE — Patient Instructions (Signed)
Medication Instructions:  Your physician has recommended you make the following change in your medication:   Decrease Coreg to 3.125 mg Two Times Daily   *If you need a refill on your cardiac medications before your next appointment, please call your pharmacy*   Lab Work: Your physician recommends that you return for lab work in: Fasting Lipid   If you have labs (blood work) drawn today and your tests are completely normal, you will receive your results only by: MyChart Message (if you have MyChart) OR A paper copy in the mail If you have any lab test that is abnormal or we need to change your treatment, we will call you to review the results.   Testing/Procedures: NONE    Follow-Up: At [facility], you and your health needs are our priority.  As part of our continuing mission to provide you with exceptional heart care, we have created designated Provider Care Teams.  These Care Teams include your primary Cardiologist (physician) and Advanced Practice Providers (APPs -  Physician Assistants and Nurse Practitioners) who all work together to provide you with the care you need, when you need it.  We recommend signing up for the patient portal called "MyChart".  Sign up information is provided on this After Visit Summary.  MyChart is used to connect with patients for Virtual Visits (Telemedicine).  Patients are able to view lab/test results, encounter notes, upcoming appointments, etc.  Non-urgent messages can be sent to your provider as well.   To learn more about what you can do with MyChart, go to [email or web address].    Your next appointment:   6 month(s)  Provider:   You may see Nona Dell, MD or one of the following Advanced Practice Providers on your designated Care Team:   Randall An, PA-C  Jacolyn Reedy, PA-C     Other Instructions Thank you for choosing [facility]!

## 2023-04-03 ENCOUNTER — Other Ambulatory Visit (HOSPITAL_COMMUNITY)
Admission: RE | Admit: 2023-04-03 | Discharge: 2023-04-03 | Disposition: A | Discharge status: Home / Self Care | Payer: Medicare HMO | Source: Ambulatory Visit | Attending: Cardiology | Admitting: Cardiology

## 2023-04-03 DIAGNOSIS — E782 Mixed hyperlipidemia: Secondary | ICD-10-CM | POA: Insufficient documentation

## 2023-04-03 LAB — LIPID PANEL
Cholesterol: 132 mg/dL (ref 0–200)
HDL: 30 mg/dL — ABNORMAL LOW (ref >40)
LDL Cholesterol: 83 mg/dL (ref 0–99)
Total CHOL/HDL Ratio: 4.4 RATIO
Triglycerides: 95 mg/dL (ref <150)
VLDL: 19 mg/dL (ref 0–40)

## 2023-04-08 ENCOUNTER — Telehealth: Payer: Self-pay

## 2023-04-08 DIAGNOSIS — E782 Mixed hyperlipidemia: Secondary | ICD-10-CM

## 2023-04-08 MED ORDER — ROSUVASTATIN CALCIUM 5 MG PO TABS: 5.0000 mg | ORAL_TABLET | ORAL | 3 refills | Status: DC

## 2023-04-08 NOTE — Telephone Encounter (Signed)
Patient agrees with plan, will take crestor 5 mg twice a week and will repeat lipids in 3 months

## 2023-04-08 NOTE — Telephone Encounter (Signed)
-----   Message from Nona Dell sent at 04/03/2023  4:19 PM EDT ----- Results reviewed.  See recent office visit note.  LDL is 83, ideally would like it to be 55 or less with vascular disease.  He does have history of statin intolerance with Lipitor.  On gemfibrozil at this time.  See if he would be willing to take Crestor 5 mg twice weekly for now.  If tolerates would recheck FLP in 3 months.

## 2023-04-10 ENCOUNTER — Ambulatory Visit (INDEPENDENT_AMBULATORY_CARE_PROVIDER_SITE_OTHER): Payer: Medicare HMO

## 2023-04-10 VITALS — Ht 71.0 in | Wt 205.0 lb

## 2023-04-10 DIAGNOSIS — Z0000 Encounter for general adult medical examination without abnormal findings: Secondary | ICD-10-CM

## 2023-04-10 NOTE — Patient Instructions (Addendum)
[name] , Thank you for taking time to come for your Medicare Wellness Visit. I appreciate your ongoing commitment to your health goals. Please review the following plan we discussed and let me know if I can assist you in the future.   These are the goals we discussed:  Goals       Patient Stated (pt-stated)      Remain healthy and lose weight      Weight (lb) < 200 lb (90.7 kg)      04/01/2022-Continue to stay active and healthy.         This is a list of the screening recommended for you and due dates:  Health Maintenance  Topic Date Due   DTaP/Tdap/Td vaccine (1 - Tdap) Never done   COVID-19 Vaccine (3 - Moderna risk series) 01/21/2024*   Zoster (Shingles) Vaccine (2 of 2) 01/21/2024*   Flu Shot  04/23/2023   Medicare Annual Wellness Visit  04/09/2024   Pneumonia Vaccine  Completed   HPV Vaccine  Aged Out  *Topic was postponed. The date shown is not the original due date.    Advanced directives: Advance directive discussed with you today. Even though you declined this today, please call our office should you change your mind, and we can give you the proper paperwork for you to fill out. Advance care planning is a way to make decisions about medical care that fits your values in case you are ever unable to make these decisions for yourself.  Information on Advanced Care Planning can be found at [facility] Advance Health Care Directives Advance Health Care Directives ([email or web address]    Conditions/risks identified:  You are due for the vaccines checked below. You may have these done at your preferred pharmacy. Please have them fax the office proof of the vaccines so that we can update your chart.   []  Flu (due annually) [x]  Shingrix (Shingles vaccine) The second booster []  Pneumonia Vaccines []  TDAP (Tetanus) Vaccine every 10 years [x]  Covid-19    Next appointment: VIRTUAL/ TELEPHONE VISIT Follow up in one year for your annual wellness visit  April 15, 2024 at 8:30 am TELEPHONE VISIT   Preventive Care 51 Years and Older, Male  Preventive care refers to lifestyle choices and visits with your health care provider that can promote health and wellness. What does preventive care include? A yearly physical exam. This is also called an annual well check. Dental exams once or twice a year. Routine eye exams. Ask your health care provider how often you should have your eyes checked. Personal lifestyle choices, including: Daily care of your teeth and gums. Regular physical activity. Eating a healthy diet. Avoiding tobacco and drug use. Limiting alcohol use. Practicing safe sex. Taking low doses of aspirin every day. Taking vitamin and mineral supplements as recommended by your health care provider. What happens during an annual well check? The services and screenings done by your health care provider during your annual well check will depend on your age, overall health, lifestyle risk factors, and family history of disease. Counseling  Your health care provider may ask you questions about your: Alcohol use. Tobacco use. Drug use. Emotional well-being. Home and relationship well-being. Sexual activity. Eating habits. History of falls. Memory and ability to understand (cognition). Work and work Astronomer. Screening  You may have the following tests or measurements: Height, weight, and BMI. Blood pressure. Lipid and cholesterol levels. These may be checked every 5 years, or more frequently  if you are over 109 years old. Skin check. Lung cancer screening. You may have this screening every year starting at age 61 if you have a 30-pack-year history of smoking and currently smoke or have quit within the past 15 years. Fecal occult blood test (FOBT) of the stool. You may have this test every year starting at age 59. Flexible sigmoidoscopy or colonoscopy. You may have a sigmoidoscopy every 5 years or a colonoscopy every 10 years starting  at age 73. Prostate cancer screening. Recommendations will vary depending on your family history and other risks. Hepatitis C blood test. Hepatitis B blood test. Sexually transmitted disease (STD) testing. Diabetes screening. This is done by checking your blood sugar (glucose) after you have not eaten for a while (fasting). You may have this done every 1-3 years. Abdominal aortic aneurysm (AAA) screening. You may need this if you are a current or former smoker. Osteoporosis. You may be screened starting at age 3 if you are at high risk. Talk with your health care provider about your test results, treatment options, and if necessary, the need for more tests. Vaccines  Your health care provider may recommend certain vaccines, such as: Influenza vaccine. This is recommended every year. Tetanus, diphtheria, and acellular pertussis (Tdap, Td) vaccine. You may need a Td booster every 10 years. Zoster vaccine. You may need this after age 53. Pneumococcal 13-valent conjugate (PCV13) vaccine. One dose is recommended after age 26. Pneumococcal polysaccharide (PPSV23) vaccine. One dose is recommended after age 31. Talk to your health care provider about which screenings and vaccines you need and how often you need them. This information is not intended to replace advice given to you by your health care provider. Make sure you discuss any questions you have with your health care provider. Document Released: 10/05/2015 Document Revised: 05/28/2016 Document Reviewed: 07/10/2015 Elsevier Interactive Patient Education  2017 [employer].  Fall Prevention in the Home Falls can cause injuries. They can happen to people of all ages. There are many things you can do to make your home safe and to help prevent falls. What can I do on the outside of my home? Regularly fix the edges of walkways and driveways and fix any cracks. Remove anything that might make you trip as you walk through a door, such as a raised  step or threshold. Trim any bushes or trees on the path to your home. Use bright outdoor lighting. Clear any walking paths of anything that might make someone trip, such as rocks or tools. Regularly check to see if handrails are loose or broken. Make sure that both sides of any steps have handrails. Any raised decks and porches should have guardrails on the edges. Have any leaves, snow, or ice cleared regularly. Use sand or salt on walking paths during winter. Clean up any spills in your garage right away. This includes oil or grease spills. What can I do in the bathroom? Use night lights. Install grab bars by the toilet and in the tub and shower. Do not use towel bars as grab bars. Use non-skid mats or decals in the tub or shower. If you need to sit down in the shower, use a plastic, non-slip stool. Keep the floor dry. Clean up any water that spills on the floor as soon as it happens. Remove soap buildup in the tub or shower regularly. Attach bath mats securely with double-sided non-slip rug tape. Do not have throw rugs and other things on the floor that can make  you trip. What can I do in the bedroom? Use night lights. Make sure that you have a light by your bed that is easy to reach. Do not use any sheets or blankets that are too big for your bed. They should not hang down onto the floor. Have a firm chair that has side arms. You can use this for support while you get dressed. Do not have throw rugs and other things on the floor that can make you trip. What can I do in the kitchen? Clean up any spills right away. Avoid walking on wet floors. Keep items that you use a lot in easy-to-reach places. If you need to reach something above you, use a strong step stool that has a grab bar. Keep electrical cords out of the way. Do not use floor polish or wax that makes floors slippery. If you must use wax, use non-skid floor wax. Do not have throw rugs and other things on the floor that can  make you trip. What can I do with my stairs? Do not leave any items on the stairs. Make sure that there are handrails on both sides of the stairs and use them. Fix handrails that are broken or loose. Make sure that handrails are as long as the stairways. Check any carpeting to make sure that it is firmly attached to the stairs. Fix any carpet that is loose or worn. Avoid having throw rugs at the top or bottom of the stairs. If you do have throw rugs, attach them to the floor with carpet tape. Make sure that you have a light switch at the top of the stairs and the bottom of the stairs. If you do not have them, ask someone to add them for you. What else can I do to help prevent falls? Wear shoes that: Do not have high heels. Have rubber bottoms. Are comfortable and fit you well. Are closed at the toe. Do not wear sandals. If you use a stepladder: Make sure that it is fully opened. Do not climb a closed stepladder. Make sure that both sides of the stepladder are locked into place. Ask someone to hold it for you, if possible. Clearly mark and make sure that you can see: Any grab bars or handrails. First and last steps. Where the edge of each step is. Use tools that help you move around (mobility aids) if they are needed. These include: Canes. Walkers. Scooters. Crutches. Turn on the lights when you go into a dark area. Replace any light bulbs as soon as they burn out. Set up your furniture so you have a clear path. Avoid moving your furniture around. If any of your floors are uneven, fix them. If there are any pets around you, be aware of where they are. Review your medicines with your doctor. Some medicines can make you feel dizzy. This can increase your chance of falling. Ask your doctor what other things that you can do to help prevent falls. This information is not intended to replace advice given to you by your health care provider. Make sure you discuss any questions you have with  your health care provider. Document Released: 07/05/2009 Document Revised: 02/14/2016 Document Reviewed: 10/13/2014 Elsevier Interactive Patient Education  2017 [employer].

## 2023-04-10 NOTE — Progress Notes (Signed)
Because this visit was a virtual/telehealth visit,  certain criteria was not obtained, such a blood pressure, CBG if patient is a diabetic, and timed up and go. Any medications not marked as "taking" was not mentioned during the medication reconciliation part of the visit. Any vitals not documented were not able to be obtained due to this being a telehealth visit. Vitals documented are verbally provided by the patient.   Subjective:   Hector [name] is a 82 y.o. male who presents for Medicare Annual/Subsequent preventive examination.  Visit Complete: Virtual  I connected with  Hector [name] on 04/10/23 by a audio enabled telemedicine application and verified that I am speaking with the correct person using two identifiers.  Patient Location: Home  Provider Location: Home Office  I discussed the limitations of evaluation and management by telemedicine. The patient expressed understanding and agreed to proceed.  Patient Medicare AWV questionnaire was completed by the patient on 04/09/2023; I have confirmed that all information answered by patient is correct and no changes since this date.  Review of Systems     Cardiac Risk Factors include: advanced age (>36men, >82 women);dyslipidemia;hypertension;male gender;sedentary lifestyle     Objective:    Today's Vitals   04/10/23 1252  Weight: 205 lb (93 kg)  Height: 5\' 11"  (1.803 m)   Body mass index is 28.59 kg/m.     04/10/2023   12:54 PM 12/05/2022    9:41 AM 05/30/2022    8:10 AM 04/01/2022   11:43 AM 11/20/2021   11:34 AM 09/12/2021   10:02 AM 08/23/2021    8:11 AM  Advanced Directives  Does Patient Have a Medical Advance Directive? No Yes Yes Yes Yes Yes Yes  Type of Furniture conservator/restorer;Living will Living will;Healthcare Power of [employer] Power of [place];Living will Healthcare Power of [place];Living will Healthcare Power of [place];Living will Healthcare Power of [place];Living will   Does patient want to make changes to medical advance directive?  No - Patient declined No - Patient declined  No - Patient declined No - Patient declined No - Patient declined  Copy of Healthcare Power of Attorney in Chart?  No - copy requested No - copy requested No - copy requested No - copy requested No - copy requested No - copy requested  Would patient like information on creating a medical advance directive? No - Patient declined          Current Medications (verified) Outpatient Encounter Medications as of 04/10/2023  Medication Sig   amLODipine (NORVASC) 10 MG tablet TAKE 1 TABLET EVERY DAY   benazepril (LOTENSIN) 40 MG tablet Take 1 tablet (40 mg total) by mouth daily.   carvedilol (COREG) 3.125 MG tablet Take 1 tablet (3.125 mg total) by mouth 2 (two) times daily with a meal.   cloNIDine (CATAPRES) 0.2 MG tablet Take 1 tablet (0.2 mg total) by mouth 2 (two) times daily.   gemfibrozil (LOPID) 600 MG tablet TAKE 1 TABLET TWICE DAILY BEFORE MEALS   rosuvastatin (CRESTOR) 5 MG tablet Take 1 tablet (5 mg total) by mouth 2 (two) times a week.   spironolactone (ALDACTONE) 25 MG tablet TAKE 1 TABLET EVERY DAY   No facility-administered encounter medications on file as of 04/10/2023.    Allergies (verified) Lipitor [atorvastatin] and Lisinopril   History: Past Medical History:  Diagnosis Date   Chronic hip pain    Left hip pain   ED (erectile dysfunction)    Essential hypertension  Hyperlipidemia    IFG (impaired fasting glucose)    Noncompliance    Normocytic anemia    Obesity    Osteoarthritis    Past Surgical History:  Procedure Laterality Date   BILATERAL KNEE ARTHROSCOPY     CHOLECYSTECTOMY  2010   COLONOSCOPY N/A 03/21/2015   Surgeon: Corbin Ade, MD;  Multiple anal papilla-1 prominent, normal-appearing rectal mucosa, scattered left-sided diverticula, the remainder of the exam was normal.  No recommendations to repeat colonoscopy.   HIP FRACTURE SURGERY  2009    LEFT HIP   KNEE ARTHROTOMY BILATERAL 50 + YRS AGO     REFRACTIVE SURGERY Bilateral    LASIK   TOTAL HIP ARTHROPLASTY Left 07/24/2014   Procedure: CONVERSION OF PREVIOUS HIP SURGERY LEFT HIP ARTHROPLASTY;  Surgeon: [name], MD;  Location: [facility];  Service: Orthopedics;  Laterality: Left;   TOTAL KNEE ARTHROPLASTY Right 02/15/2019   Procedure: TOTAL KNEE ARTHROPLASTY;  Surgeon: Durene Romans, MD;  Location: [facility];  Service: Orthopedics;  Laterality: Right;  70 mins   Family History  Problem Relation Age of Onset   Hypertension Mother    Diabetes Mother    Hypertension Father    Heart attack Father    Diabetes Sister    Colon cancer Neg Hx    Inflammatory bowel disease Neg Hx    Social History   Socioeconomic History   Marital status: Married    Spouse name: Alona Bene   Number of children: Not on file   Years of education: Not on file   Highest education level: 12th grade  Occupational History   Not on file  Tobacco Use   Smoking status: Former    Types: Pipe    Quit date: 1963    Years since quitting: 61.5   Smokeless tobacco: Never   Tobacco comments:    quit 40-50 years ago  Vaping Use   Vaping status: Never Used  Substance and Sexual Activity   Alcohol use: No    Comment: QUIT SMOKING AGE 53   Drug use: No   Sexual activity: Yes  Other Topics Concern   Not on file  Social History Narrative   Married x 8 years to [place]. Dated in HS, married separate people and both divorced re-met after 52 years, married in 2015.   Social Determinants of Health   Financial Resource Strain: Low Risk  (04/10/2023)   Overall Financial Resource Strain (CARDIA)    Difficulty of Paying Living Expenses: Not hard at all  Food Insecurity: No Food Insecurity (04/10/2023)   Hunger Vital Sign    Worried About Running Out of Food in the Last Year: Never true    Ran Out of Food in the Last Year: Never true  Transportation Needs: No Transportation Needs (04/10/2023)   PRAPARE -  Administrator, Civil Service (Medical): No    Lack of Transportation (Non-Medical): No  Physical Activity: Insufficiently Active (04/10/2023)   Exercise Vital Sign    Days of Exercise per Week: 1 day    Minutes of Exercise per Session: 20 min  Stress: No Stress Concern Present (04/10/2023)   [employer] of Occupational Health - Occupational Stress Questionnaire    Feeling of Stress : Only a little  Social Connections: Socially Integrated (04/10/2023)   Social Connection and Isolation Panel [NHANES]    Frequency of Communication with Friends and Family: Three times a week    Frequency of Social Gatherings with Friends and Family: Once a  week    Attends Religious Services: More than 4 times per year    Active Member of Clubs or Organizations: Yes    Attends Banker Meetings: 1 to 4 times per year    Marital Status: Married    Tobacco Counseling Counseling given: Yes Tobacco comments: quit 40-50 years ago   Clinical Intake:  Pre-visit preparation completed: Yes  Pain : No/denies pain     Nutritional Risks: None Diabetes: No  How often do you need to have someone help you when you read instructions, pamphlets, or other written materials from your doctor or pharmacy?: 1 - Never  Interpreter Needed?: No  Information entered by :: Abby Brandonlee Navis,CMA   Activities of Daily Living    04/10/2023    1:02 PM 04/09/2023    4:22 PM  In your present state of health, do you have any difficulty performing the following activities:  Hearing? 0 0  Vision? 0 0  Difficulty concentrating or making decisions? 0 0  Walking or climbing stairs? 0 0  Dressing or bathing? 0 0  Doing errands, shopping? 0 0  Preparing Food and eating ? N N  Using the Toilet? N N  In the past six months, have you accidently leaked urine? N N  Do you have problems with loss of bowel control? N N  Managing your Medications? N N  Managing your Finances? N N  Housekeeping or managing  your Housekeeping? N N    Patient Care Team: Tommie Sams, DO as PCP - General (Family Medicine) Jonelle Sidle, MD as PCP - Cardiology (Cardiology) Jena Gauss Gerrit Friends, MD as Consulting Physician (Gastroenterology) Doreatha Massed, MD as Medical Oncologist (Hematology)  Indicate any recent Medical Services you may have received from other than [facility] providers in the past year (date may be approximate).     Assessment:   This is a routine wellness examination for Hector [name].  Hearing/Vision screen Hearing Screening - Comments:: Patient wears hearing aids due to hearing difficulties.    Dietary issues and exercise activities discussed:     Goals Addressed               This Visit's Progress     Patient Stated (pt-stated)        Remain healthy and lose weight       Depression Screen    04/10/2023   12:55 PM 02/11/2023    8:48 AM 01/12/2023    9:18 AM 12/10/2022    9:11 AM 04/01/2022   11:41 AM 03/14/2021   10:02 AM 03/12/2021    2:31 PM  PHQ 2/9 Scores  PHQ - 2 Score 0 0 0 0 0 0 0  PHQ- 9 Score 0 0 1 1       Fall Risk    04/10/2023    1:02 PM 04/09/2023    4:22 PM 01/12/2023    9:39 AM 12/10/2022    9:11 AM 04/01/2022   11:46 AM  Fall Risk   Falls in the past year? 0 0 0 0 0  Number falls in past yr: 0 0 0 0 0  Injury with Fall? 0 0 0 0 0  Risk for fall due to : No Fall Risks  No Fall Risks No Fall Risks No Fall Risks  Follow up Falls prevention discussed  Falls evaluation completed Falls evaluation completed Falls prevention discussed    MEDICARE RISK AT HOME:  Medicare Risk at Home - 04/10/23 1303     Any  stairs in or around the home? Yes    If so, are there any without handrails? No    Home free of loose throw rugs in walkways, pet beds, electrical cords, etc? Yes    Adequate lighting in your home to reduce risk of falls? Yes    Life alert? No    Use of a cane, walker or w/c? No    Grab bars in the bathroom? Yes    Shower chair or bench in shower? No     Elevated toilet seat or a handicapped toilet? No             TIMED UP AND GO:  Was the test performed?  No    Cognitive Function:        04/10/2023   12:54 PM 04/01/2022   11:48 AM  6CIT Screen  What Year? 0 points 0 points  What month? 0 points 0 points  What time? 0 points 0 points  Count back from 20 0 points 0 points  Months in reverse 0 points 0 points  Repeat phrase 0 points 0 points  Total Score 0 points 0 points    Immunizations Immunization History  Administered Date(s) Administered   Influenza, High Dose Seasonal PF 06/24/2019   Influenza,inj,Quad PF,6+ Mos 05/28/2016, 06/01/2017, 06/03/2018   Influenza-Unspecified 07/13/2014, 07/16/2015, 06/06/2020   Moderna SARS-COV2 Booster Vaccination 06/27/2020   Moderna Sars-Covid-2 Vaccination 12/22/2019, 01/21/2020, 07/17/2020   Pneumococcal Conjugate-13 08/07/2015   Pneumococcal Polysaccharide-23 07/23/2012, 07/16/2015   Zoster Recombinant(Shingrix) 03/21/2020   Zoster, Live 08/14/2008    TDAP status: Up to date  Flu Vaccine status: Up to date  Pneumococcal vaccine status: Up to date  Covid-19 vaccine status: Information provided on how to obtain vaccines.   Qualifies for Shingles Vaccine? Yes   Zostavax completed Yes   Shingrix Completed?: No.    Education has been provided regarding the importance of this vaccine. Patient has been advised to call insurance company to determine out of pocket expense if they have not yet received this vaccine. Advised may also receive vaccine at local pharmacy or Health Dept. Verbalized acceptance and understanding.  Screening Tests Health Maintenance  Topic Date Due   DTaP/Tdap/Td (1 - Tdap) Never done   Medicare Annual Wellness (AWV)  04/02/2023   COVID-19 Vaccine (3 - Moderna risk series) 01/21/2024 (Originally 08/14/2020)   Zoster Vaccines- Shingrix (2 of 2) 01/21/2024 (Originally 05/16/2020)   INFLUENZA VACCINE  04/23/2023   Pneumonia Vaccine 51+ Years old   Completed   HPV VACCINES  Aged Out    Health Maintenance  Health Maintenance Due  Topic Date Due   DTaP/Tdap/Td (1 - Tdap) Never done   Medicare Annual Wellness (AWV)  04/02/2023    Colorectal cancer screening: No longer required.   Lung Cancer Screening: (Low Dose CT Chest recommended if Age 2-80 years, 20 pack-year currently smoking OR have quit w/in 15years.) does not qualify.   Lung Cancer Screening Referral: n/a  Additional Screening:  Hepatitis C Screening: does not qualify   Vision Screening: Recommended annual ophthalmology exams for early detection of glaucoma and other disorders of the eye. Is the patient up to date with their annual eye exam?  Yes  Who is the provider or what is the name of the office in which the patient attends annual eye exams? [facility] If pt is not established with a provider, would they like to be referred to a provider to establish care? No .   Dental Screening:  Recommended annual dental exams for proper oral hygiene  Diabetic Foot Exam: n/a  Community Resource Referral / Chronic Care Management: CRR required this visit?  No   CCM required this visit?  No     Plan:     I have personally reviewed and noted the following in the patient's chart:   Medical and social history Use of alcohol, tobacco or illicit drugs  Current medications and supplements including opioid prescriptions. Patient is not currently taking opioid prescriptions. Functional ability and status Nutritional status Physical activity Advanced directives List of other physicians Hospitalizations, surgeries, and ER visits in previous 12 months Vitals Screenings to include cognitive, depression, and falls Referrals and appointments  In addition, I have reviewed and discussed with patient certain preventive protocols, quality metrics, and best practice recommendations. A written personalized care plan for preventive services as well as general preventive  health recommendations were provided to patient.     Jordan Hawks Cyan Clippinger, CMA   04/10/2023   After Visit Summary: (MyChart) Due to this being a telephonic visit, the after visit summary with patients personalized plan was offered to patient via MyChart

## 2023-05-03 ENCOUNTER — Other Ambulatory Visit: Payer: Self-pay | Admitting: Cardiology

## 2023-05-12 ENCOUNTER — Encounter: Payer: Self-pay | Admitting: Pharmacist

## 2023-05-12 NOTE — Progress Notes (Signed)
Pharmacy Quality Measure Review  This patient is appearing on a report for being at risk of failing the adherence measure for hypertension (ACEi/ARB) medications this calendar year.   Medication: benazepril 40 mg daily Last fill date: 04/07/23 for 90 day supply  Insurance report was not up to date. No action needed at this time.   Adam Phenix, PharmD PGY-1 Pharmacy Resident

## 2023-06-09 ENCOUNTER — Ambulatory Visit (INDEPENDENT_AMBULATORY_CARE_PROVIDER_SITE_OTHER): Payer: Medicare HMO | Admitting: Family Medicine

## 2023-06-09 VITALS — BP 162/86 | HR 63 | Temp 98.2°F | Ht 71.0 in | Wt 212.8 lb

## 2023-06-09 DIAGNOSIS — I1A0 Resistant hypertension: Secondary | ICD-10-CM | POA: Diagnosis not present

## 2023-06-09 DIAGNOSIS — I2510 Atherosclerotic heart disease of native coronary artery without angina pectoris: Secondary | ICD-10-CM

## 2023-06-09 DIAGNOSIS — E782 Mixed hyperlipidemia: Secondary | ICD-10-CM

## 2023-06-09 DIAGNOSIS — I700 Atherosclerosis of aorta: Secondary | ICD-10-CM | POA: Insufficient documentation

## 2023-06-09 NOTE — Progress Notes (Signed)
Subjective:  Patient ID: Hector [name], male    DOB: 1941-05-13  Age: 82 y.o. MRN: [id number]  CC:  Follow up   HPI:  82 year old male with the below mentioned medical problems presents for follow-up.  Patient has not taken his medication yet today.  Therefore, blood pressure is significantly elevated.  Has recently seen cardiology and blood pressure was well-controlled.  Advised compliance with his medication.  Denies chest pain or shortness of breath.  He is overall feeling well.  Declines his flu shot today.  Patient recently placed on Crestor regarding his lipids.  He is tolerating.  Patient Active Problem List   Diagnosis Date Noted   CAD (coronary artery disease) 06/09/2023   Aortic atherosclerosis (HCC) 06/09/2023   Resistant hypertension 01/12/2023   Hemochromatosis 12/10/2022   Anemia 10/24/2020   S/P right TKA 02/15/2019   Diverticulosis of colon without hemorrhage    Hyperlipemia, mixed 01/18/2013   Sensory neuropathy 01/18/2013    Social Hx   Social History   Socioeconomic History   Marital status: Married    Spouse name: Alona Bene   Number of children: Not on file   Years of education: Not on file   Highest education level: 12th grade  Occupational History   Not on file  Tobacco Use   Smoking status: Former    Types: Pipe    Quit date: 1963    Years since quitting: 61.7   Smokeless tobacco: Never   Tobacco comments:    quit 40-50 years ago  Vaping Use   Vaping status: Never Used  Substance and Sexual Activity   Alcohol use: No    Comment: QUIT SMOKING AGE 80   Drug use: No   Sexual activity: Yes  Other Topics Concern   Not on file  Social History Narrative   Married x 8 years to [place]. Dated in HS, married separate people and both divorced re-met after 52 years, married in 2015.   Social Determinants of Health   Financial Resource Strain: Low Risk  (04/10/2023)   Overall Financial Resource Strain (CARDIA)    Difficulty of Paying Living  Expenses: Not hard at all  Food Insecurity: No Food Insecurity (04/10/2023)   Hunger Vital Sign    Worried About Running Out of Food in the Last Year: Never true    Ran Out of Food in the Last Year: Never true  Transportation Needs: No Transportation Needs (04/10/2023)   PRAPARE - Administrator, Civil Service (Medical): No    Lack of Transportation (Non-Medical): No  Physical Activity: Insufficiently Active (04/10/2023)   Exercise Vital Sign    Days of Exercise per Week: 1 day    Minutes of Exercise per Session: 20 min  Stress: No Stress Concern Present (04/10/2023)   [employer] of Occupational Health - Occupational Stress Questionnaire    Feeling of Stress : Only a little  Social Connections: Socially Integrated (04/10/2023)   Social Connection and Isolation Panel [NHANES]    Frequency of Communication with Friends and Family: Three times a week    Frequency of Social Gatherings with Friends and Family: Once a week    Attends Religious Services: More than 4 times per year    Active Member of [employer] or Organizations: Yes    Attends Banker Meetings: 1 to 4 times per year    Marital Status: Married    Review of Systems Per HPI  Objective:  BP (!) 162/86   Pulse 63  Temp 98.2 F (36.8 C)   Ht 5\' 11"  (1.803 m)   Wt 212 lb 12.8 oz (96.5 kg)   SpO2 97%   BMI 29.68 kg/m      06/09/2023   10:07 AM 04/10/2023   12:52 PM 04/01/2023   10:41 AM  BP/Weight  Systolic BP 162 -- 126  Diastolic BP 86 -- 68  Wt. (Lbs) 212.8 205 212.8  BMI 29.68 kg/m2 28.59 kg/m2 29.68 kg/m2    Physical Exam Vitals and nursing note reviewed.  Constitutional:      General: He is not in acute distress.    Appearance: Normal appearance.  HENT:     Head: Normocephalic and atraumatic.  Cardiovascular:     Rate and Rhythm: Normal rate and regular rhythm.  Pulmonary:     Effort: Pulmonary effort is normal.     Breath sounds: Normal breath sounds. No wheezing or rales.   Neurological:     Mental Status: He is alert.  Psychiatric:        Mood and Affect: Mood normal.        Behavior: Behavior normal.     Lab Results  Component Value Date   WBC 4.9 11/28/2022   HGB 12.4 (L) 11/28/2022   HCT 37.0 (L) 11/28/2022   PLT 273 11/28/2022   GLUCOSE 98 03/13/2023   CHOL 132 04/03/2023   TRIG 95 04/03/2023   HDL 30 (L) 04/03/2023   LDLCALC 83 04/03/2023   ALT 27 11/28/2022   AST 26 11/28/2022   NA 139 03/13/2023   K 4.6 03/13/2023   CL 105 03/13/2023   CREATININE 0.98 03/13/2023   BUN 16 03/13/2023   CO2 21 03/13/2023   TSH 2.590 10/26/2020   PSA 3.34 04/18/2014   INR 1.10 07/18/2014   HGBA1C 6.0 (H) 05/22/2020     Assessment & Plan:   Problem List Items Addressed This Visit       Cardiovascular and Mediastinum   Resistant hypertension    Has been stable.  Has taken medication.  Advised him to stay compliant with his medication.      CAD (coronary artery disease) - Primary   Aortic atherosclerosis (HCC)     Other   Hyperlipemia, mixed    Tolerating Crestor.  Will check lipids at follow-up if not done by cardiology.       Follow-up:  Return in about 6 months (around 12/07/2023).  Everlene Other DO [facility]

## 2023-06-09 NOTE — Patient Instructions (Signed)
Continue your medications.    Follow up in 6 months

## 2023-06-09 NOTE — Assessment & Plan Note (Signed)
Has been stable.  Has taken medication.  Advised him to stay compliant with his medication.

## 2023-06-09 NOTE — Assessment & Plan Note (Signed)
Tolerating Crestor.  Will check lipids at follow-up if not done by cardiology.

## 2023-06-12 ENCOUNTER — Inpatient Hospital Stay: Payer: Medicare HMO | Attending: Hematology

## 2023-06-12 DIAGNOSIS — R7989 Other specified abnormal findings of blood chemistry: Secondary | ICD-10-CM

## 2023-06-12 DIAGNOSIS — D649 Anemia, unspecified: Secondary | ICD-10-CM

## 2023-06-12 LAB — IRON AND TIBC
Iron: 158 ug/dL (ref 45–182)
Saturation Ratios: 49 % — ABNORMAL HIGH (ref 17.9–39.5)
TIBC: 324 ug/dL (ref 250–450)
UIBC: 166 ug/dL

## 2023-06-12 LAB — CBC
HCT: 35.4 % — ABNORMAL LOW (ref 39.0–52.0)
Hemoglobin: 11.7 g/dL — ABNORMAL LOW (ref 13.0–17.0)
MCH: 30.5 pg (ref 26.0–34.0)
MCHC: 33.1 g/dL (ref 30.0–36.0)
MCV: 92.4 fL (ref 80.0–100.0)
Platelets: 273 10*3/uL (ref 150–400)
RBC: 3.83 MIL/uL — ABNORMAL LOW (ref 4.22–5.81)
RDW: 15.7 % — ABNORMAL HIGH (ref 11.5–15.5)
WBC: 7.3 10*3/uL (ref 4.0–10.5)
nRBC: 0.6 % — ABNORMAL HIGH (ref 0.0–0.2)

## 2023-06-12 LAB — FERRITIN: Ferritin: 335 ng/mL (ref 24–336)

## 2023-06-18 ENCOUNTER — Inpatient Hospital Stay: Payer: Medicare HMO | Admitting: Oncology

## 2023-06-18 VITALS — BP 151/60 | HR 60 | Temp 97.9°F | Resp 17 | Ht 71.0 in | Wt 214.5 lb

## 2023-06-18 DIAGNOSIS — D649 Anemia, unspecified: Secondary | ICD-10-CM | POA: Diagnosis not present

## 2023-06-18 DIAGNOSIS — R7989 Other specified abnormal findings of blood chemistry: Secondary | ICD-10-CM

## 2023-06-18 NOTE — Progress Notes (Signed)
[facility] 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 male returns for routine follow-up of elevated ferritin and normocytic anemia.  He was last seen by Rojelio Brenner PA-C on 12/05/22.   At today's visit, he reports feeling well.  No recent hospitalizations, surgeries, or changes in baseline health status.  Patient was evaluated at [facility] in late May after episode of syncope.  No acute findings in ER assessment and syncope was thought to be secondary to starting Lyrica.  Lyrica was stopped and he has not had any additional issues.  He had a 14-day heart monitor placed by [facility] reporting sinus rhythm with heart rate ranging from 38 to 141 bpm with average heart rate 51 bpm.  Also noted prolonged PR interval and IVCD with brief episodes of PSVT without high degree heart block or pauses.  Cardiology reduce Coreg to 3.125 mg twice daily and instructed to stay on gemfibrozil for aortic atherosclerosis and three-vessel distribution coronary calcification.  Most recent lipid panel showed LDL 83 which cardiology ideally would like at 55 or less with history of vascular disease.  He was started on Crestor 5 mg twice weekly for now.    He has not needed a phlebotomy but has donated blood voluntarily from time to time last donation was over two years ago because of his anemia.  Reports the Red Cross recommends his hemoglobin to be 12.5 or greater.  Today, he reports appetite and energy levels about 100%.  Denies any pain.  Has intermittent sleep problems but feels like he is doing relatively well given his age. He denies any abnormal joint pain, abdominal  pain, fatigue, decreased appetite, or skin changes.  He does not take any iron supplements at home.  He does not drink alcohol.  He eats red meat about once each week.  He denies any signs of blood loss such as epistaxis, hematemesis, hematochezia, or melena.   ASSESSMENT & PLAN:  1.  Elevated ferritin with compound heterozygosity for hemochromatosis (C282Y and H63D) - He reported that he used to donate blood at [facility].  However he was found to be anemic in August 2021 with hemoglobin 10.3.  His ferritin was 689 on 06/14/2020. - He was evaluated by GI and was started on iron pill around November 2021 through May 2022. - Elevated ferritin 689 (06/14/2020), 501 (10/26/2020), 587 (04/16/2021), 658 (07/05/2021).  Percent saturation varied from 22 to 48%. 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 - Ultrasound of the abdomen on 09/02/2021 showed hepatic steatosis. -Most recent labs from 06/12/2023 show a ferritin of 335 (270) and iron saturation of 49%.  - No symptoms of systemic iron overload - Elevated ferritin most likely multifactorial from fatty liver and compound heterozygosity for hemochromatosis -No indication for phlebotomy or iron chelation at this time. -Would not recommend blood donation at this time given he is slightly anemic at today's visit.  Hemoglobin is 11.7.  Would recommend hemoglobin greater than 13 for blood donation.  Avoid iron supplements and an iron rich diet.  Return to clinic in 6 months with repeat labs and visit.   2.  Normocytic  anemia, mild - He has very mild anemia  - Work-up showed normal B12, MMA, copper, folic acid.  SPEP negative.  Elevated iron as above. - He reported that he used to donate blood at [facility].  However he was found to be anemic in August 2021 with hemoglobin 10.3.  His ferritin was 689 on 06/14/2020. - He was evaluated by GI and was started on iron pill around November 2021 through May 2022. - Colonoscopy on  03/21/2015 with chronic diverticulosis.  Stool for occult blood was negative previously. - Most recently labs from 06/12/2023 show hemoglobin of 11.7 with MCV 92.4.  Normal WBCs/platelets/differential with mildly elevated RDW 18.7. - Early MDS or anemia of chronic disease is a possibility. -Will continue to monitor his labs every 6 months. -He can voluntarily donate blood as long as hemoglobin is greater than 13.0.    3.  Hypertension -Better controlled.  Blood pressure 151/60. - Patient reports that baseline SBP runs around 140-150 -Evaluated in [place] ED in late May for syncopal episode thought to be due to dehydration and possibly Lyrica.  Lyrica was discontinued and Coreg was decreased.  No additional episodes since.  4.  Social/family history: - Lives at home with his wife and handles most of the household tasks due to her heart disease.  He worked in [employer], [employer].  No chemical exposure.  Non-smoker. - No family history of hemochromatosis.  No malignancies.  PLAN SUMMARY: >> No indication for phlebotomy at this time.  >> Continue to hold blood donation until hemoglobin improves. >> RTC in 6 months for follow-up with labs a few days before.    REVIEW OF SYSTEMS:   Review of Systems  Psychiatric/Behavioral:  Positive for sleep disturbance.      PHYSICAL EXAM:  ECOG PERFORMANCE STATUS: 0 - Asymptomatic  There were no vitals filed for this visit. There were no vitals filed for this visit. Physical Exam Constitutional:      Appearance: Normal appearance.  Cardiovascular:     Rate and Rhythm: Normal rate and regular rhythm.  Pulmonary:     Effort: Pulmonary effort is normal.     Breath sounds: Normal breath sounds.  Abdominal:     General: Bowel sounds are normal.     Palpations: Abdomen is soft.  Musculoskeletal:        General: No swelling. Normal range of motion.  Neurological:     Mental Status: He is alert and oriented to person,  place, and time. Mental status is at baseline.     PAST MEDICAL/SURGICAL HISTORY:  Past Medical History:  Diagnosis Date   Chronic hip pain    Left hip pain   ED (erectile dysfunction)    Essential hypertension    Hyperlipidemia    IFG (impaired fasting glucose)    Noncompliance    Normocytic anemia    Obesity    Osteoarthritis    Past Surgical History:  Procedure Laterality Date   BILATERAL KNEE ARTHROSCOPY     CHOLECYSTECTOMY  2010   COLONOSCOPY N/A 03/21/2015   Surgeon: Corbin Ade, MD;  Multiple anal papilla-1 prominent, normal-appearing rectal mucosa, scattered left-sided diverticula, the remainder of the exam was normal.  No recommendations to repeat colonoscopy.   HIP FRACTURE SURGERY  2009   LEFT HIP   KNEE ARTHROTOMY BILATERAL 50 + YRS AGO     REFRACTIVE SURGERY Bilateral    LASIK   TOTAL HIP ARTHROPLASTY Left 07/24/2014   Procedure: CONVERSION OF PREVIOUS  HIP SURGERY LEFT HIP ARTHROPLASTY;  Surgeon: [name], MD;  Location: [facility];  Service: Orthopedics;  Laterality: Left;   TOTAL KNEE ARTHROPLASTY Right 02/15/2019   Procedure: TOTAL KNEE ARTHROPLASTY;  Surgeon: Durene Romans, MD;  Location: [facility];  Service: Orthopedics;  Laterality: Right;  70 mins    SOCIAL HISTORY:  Social History   Socioeconomic History   Marital status: Married    Spouse name: Hector [name]   Number of children: Not on file   Years of education: Not on file   Highest education level: 12th grade  Occupational History   Not on file  Tobacco Use   Smoking status: Former    Types: Pipe    Quit date: 1963    Years since quitting: 61.7   Smokeless tobacco: Never   Tobacco comments:    quit 40-50 years ago  Vaping Use   Vaping status: Never Used  Substance and Sexual Activity   Alcohol use: No    Comment: QUIT SMOKING AGE 23   Drug use: No   Sexual activity: Yes  Other Topics Concern   Not on file  Social History Narrative   Married x 8 years to [place]. Dated in HS, married  separate people and both divorced re-met after 52 years, married in 2015.   Social Determinants of Health   Financial Resource Strain: Low Risk  (04/10/2023)   Overall Financial Resource Strain (CARDIA)    Difficulty of Paying Living Expenses: Not hard at all  Food Insecurity: No Food Insecurity (04/10/2023)   Hunger Vital Sign    Worried About Running Out of Food in the Last Year: Never true    Ran Out of Food in the Last Year: Never true  Transportation Needs: No Transportation Needs (04/10/2023)   PRAPARE - Administrator, Civil Service (Medical): No    Lack of Transportation (Non-Medical): No  Physical Activity: Insufficiently Active (04/10/2023)   Exercise Vital Sign    Days of Exercise per Week: 1 day    Minutes of Exercise per Session: 20 min  Stress: No Stress Concern Present (04/10/2023)   [employer] of Occupational Health - Occupational Stress Questionnaire    Feeling of Stress : Only a little  Social Connections: Socially Integrated (04/10/2023)   Social Connection and Isolation Panel [NHANES]    Frequency of Communication with Friends and Family: Three times a week    Frequency of Social Gatherings with Friends and Family: Once a week    Attends Religious Services: More than 4 times per year    Active Member of [employer] or Organizations: Yes    Attends Banker Meetings: 1 to 4 times per year    Marital Status: Married  Catering manager Violence: Not At Risk (04/10/2023)   Humiliation, Afraid, Rape, and Kick questionnaire    Fear of Current or Ex-Partner: No    Emotionally Abused: No    Physically Abused: No    Sexually Abused: No    FAMILY HISTORY:  Family History  Problem Relation Age of Onset   Hypertension Mother    Diabetes Mother    Hypertension Father    Heart attack Father    Diabetes Sister    Colon cancer Neg Hx    Inflammatory bowel disease Neg Hx     CURRENT MEDICATIONS:  Outpatient Encounter Medications as of 06/18/2023   Medication Sig   amLODipine (NORVASC) 10 MG tablet TAKE 1 TABLET EVERY DAY   benazepril (LOTENSIN) 40 MG  tablet Take 1 tablet (40 mg total) by mouth daily.   carvedilol (COREG) 3.125 MG tablet Take 1 tablet (3.125 mg total) by mouth 2 (two) times daily with a meal.   cloNIDine (CATAPRES) 0.2 MG tablet Take 1 tablet (0.2 mg total) by mouth 2 (two) times daily.   gemfibrozil (LOPID) 600 MG tablet TAKE 1 TABLET TWICE DAILY BEFORE MEALS   rosuvastatin (CRESTOR) 5 MG tablet Take 1 tablet (5 mg total) by mouth 2 (two) times a week.   spironolactone (ALDACTONE) 25 MG tablet TAKE 1 TABLET EVERY DAY   No facility-administered encounter medications on file as of 06/18/2023.    ALLERGIES:  Allergies  Allergen Reactions   Lipitor [Atorvastatin]     Muscle and joint pain   Lisinopril     Other reaction(s): Unknown    LABORATORY DATA:  I have reviewed the labs as listed.  CBC    Component Value Date/Time   WBC 7.3 06/12/2023 1204   RBC 3.83 (L) 06/12/2023 1204   HGB 11.7 (L) 06/12/2023 1204   HGB 12.9 (L) 07/19/2021 0926   HCT 35.4 (L) 06/12/2023 1204   HCT 37.1 (L) 07/19/2021 0926   PLT 273 06/12/2023 1204   PLT 304 07/19/2021 0926   MCV 92.4 06/12/2023 1204   MCV 90 07/19/2021 0926   MCH 30.5 06/12/2023 1204   MCHC 33.1 06/12/2023 1204   RDW 15.7 (H) 06/12/2023 1204   RDW 15.1 07/19/2021 0926   LYMPHSABS 1.3 11/28/2022 0852   LYMPHSABS 1.6 07/19/2021 0926   MONOABS 0.6 11/28/2022 0852   EOSABS 0.4 11/28/2022 0852   EOSABS 0.7 (H) 07/19/2021 0926   BASOSABS 0.0 11/28/2022 0852   BASOSABS 0.1 07/19/2021 0926      Latest Ref Rng & Units 03/13/2023   12:08 PM 11/28/2022    8:52 AM 05/23/2022    8:13 AM  CMP  Glucose 70 - 99 mg/dL 98  098  119   BUN 8 - 27 mg/dL 16  21  21    Creatinine 0.76 - 1.27 mg/dL 1.47  8.29  5.62   Sodium 134 - 144 mmol/L 139  138  138   Potassium 3.5 - 5.2 mmol/L 4.6  3.5  4.2   Chloride 96 - 106 mmol/L 105  107  108   CO2 20 - 29 mmol/L 21  22  22     Calcium 8.6 - 10.2 mg/dL 9.3  9.2  9.6   Total Protein 6.5 - 8.1 g/dL  6.9  7.2   Total Bilirubin 0.3 - 1.2 mg/dL  0.5  0.5   Alkaline Phos 38 - 126 U/L  52  53   AST 15 - 41 U/L  26  18   ALT 0 - 44 U/L  27  19     DIAGNOSTIC IMAGING:  I have independently reviewed the relevant imaging and discussed with the patient.   WRAP UP:  All questions were answered. The patient knows to call the clinic with any problems, questions or concerns.  Medical decision making: Moderate  Time spent on visit: I spent 25 minutes dedicated to the care of this patient (face-to-face and non-face-to-face) on the date of the encounter to include what is described in the assessment and plan.  Durenda Hurt, NP 06/18/2023 12:27 PM

## 2023-07-05 ENCOUNTER — Other Ambulatory Visit: Payer: Self-pay | Admitting: Family Medicine

## 2023-07-07 ENCOUNTER — Ambulatory Visit: Payer: Medicare HMO | Admitting: Cardiology

## 2023-12-07 ENCOUNTER — Ambulatory Visit (INDEPENDENT_AMBULATORY_CARE_PROVIDER_SITE_OTHER): Payer: Medicare HMO | Admitting: Family Medicine

## 2023-12-07 VITALS — BP 168/72 | HR 53 | Temp 98.1°F | Ht 71.0 in | Wt 212.2 lb

## 2023-12-07 DIAGNOSIS — N400 Enlarged prostate without lower urinary tract symptoms: Secondary | ICD-10-CM | POA: Insufficient documentation

## 2023-12-07 DIAGNOSIS — D649 Anemia, unspecified: Secondary | ICD-10-CM | POA: Diagnosis not present

## 2023-12-07 DIAGNOSIS — N401 Enlarged prostate with lower urinary tract symptoms: Secondary | ICD-10-CM

## 2023-12-07 DIAGNOSIS — I1A0 Resistant hypertension: Secondary | ICD-10-CM

## 2023-12-07 DIAGNOSIS — R351 Nocturia: Secondary | ICD-10-CM | POA: Diagnosis not present

## 2023-12-07 DIAGNOSIS — E782 Mixed hyperlipidemia: Secondary | ICD-10-CM

## 2023-12-07 MED ORDER — DOXAZOSIN MESYLATE 1 MG PO TABS
1.0000 mg | ORAL_TABLET | Freq: Every day | ORAL | 1 refills | Status: DC
Start: 2023-12-07 — End: 2024-06-17

## 2023-12-07 NOTE — Assessment & Plan Note (Signed)
 New problem.  Uncontrolled.  Starting on Cardura.

## 2023-12-07 NOTE — Progress Notes (Signed)
 Subjective:  Patient ID: Hector [name], male    DOB: September 10, 1941  Age: 83 y.o. MRN: [id number]  CC: Follow-up   HPI:  83 year old male presents for follow-up.  Patient reports that overall he is doing well.  He does note that he is experiencing nocturia.  He is getting up approximately 2 times at night to use the restroom.  He reports a decreased urinary stream.  Blood pressure elevated here today.  He has known resistant hypertension.  He is on amlodipine 10 mg daily, benazepril 40 mg daily, carvedilol 3.125 mg twice daily, clonidine 0.2 mg twice daily, and spironolactone 25 mg daily.  Follows with cardiology.  Patient also follows with hematology regarding hemochromatosis and anemia.  Patient Active Problem List   Diagnosis Date Noted   BPH (benign prostatic hyperplasia) 12/07/2023   CAD (coronary artery disease) 06/09/2023   Aortic atherosclerosis (HCC) 06/09/2023   Resistant hypertension 01/12/2023   Hemochromatosis 12/10/2022   Anemia 10/24/2020   S/P right TKA 02/15/2019   Diverticulosis of colon without hemorrhage    Hyperlipemia, mixed 01/18/2013   Sensory neuropathy 01/18/2013    Social Hx   Social History   Socioeconomic History   Marital status: Married    Spouse name: Alona Bene   Number of children: Not on file   Years of education: Not on file   Highest education level: 12th grade  Occupational History   Not on file  Tobacco Use   Smoking status: Former    Types: Pipe    Quit date: 1963    Years since quitting: 62.2   Smokeless tobacco: Never   Tobacco comments:    quit 40-50 years ago  Vaping Use   Vaping status: Never Used  Substance and Sexual Activity   Alcohol use: No    Comment: QUIT SMOKING AGE 38   Drug use: No   Sexual activity: Yes  Other Topics Concern   Not on file  Social History Narrative   Married x 8 years to [place]. Dated in HS, married separate people and both divorced re-met after 52 years, married in 2015.   Social Drivers  of Corporate investment banker Strain: Low Risk  (04/10/2023)   Overall Financial Resource Strain (CARDIA)    Difficulty of Paying Living Expenses: Not hard at all  Food Insecurity: No Food Insecurity (04/10/2023)   Hunger Vital Sign    Worried About Running Out of Food in the Last Year: Never true    Ran Out of Food in the Last Year: Never true  Transportation Needs: No Transportation Needs (04/10/2023)   PRAPARE - Administrator, Civil Service (Medical): No    Lack of Transportation (Non-Medical): No  Physical Activity: Insufficiently Active (04/10/2023)   Exercise Vital Sign    Days of Exercise per Week: 1 day    Minutes of Exercise per Session: 20 min  Stress: No Stress Concern Present (04/10/2023)   [employer] of Occupational Health - Occupational Stress Questionnaire    Feeling of Stress : Only a little  Social Connections: Socially Integrated (04/10/2023)   Social Connection and Isolation Panel [NHANES]    Frequency of Communication with Friends and Family: Three times a week    Frequency of Social Gatherings with Friends and Family: Once a week    Attends Religious Services: More than 4 times per year    Active Member of [employer] or Organizations: Yes    Attends Banker Meetings: 1 to 4 times  per year    Marital Status: Married    Review of Systems Per HPI  Objective:  BP (!) 168/72   Pulse (!) 53   Temp 98.1 F (36.7 C)   Ht 5\' 11"  (1.803 m)   Wt 212 lb 3.2 oz (96.3 kg)   SpO2 97%   BMI 29.60 kg/m      12/07/2023    9:44 AM 12/07/2023    9:22 AM 06/18/2023   10:22 AM  BP/Weight  Systolic BP 168 207 151  Diastolic BP 72 79 60  Wt. (Lbs)  212.2 214.51  BMI  29.6 kg/m2 29.92 kg/m2    Physical Exam Constitutional:      General: He is not in acute distress.    Appearance: Normal appearance.  HENT:     Head: Normocephalic and atraumatic.  Eyes:     General:        Right eye: No discharge.        Left eye: No discharge.      Conjunctiva/sclera: Conjunctivae normal.  Cardiovascular:     Rate and Rhythm: Regular rhythm. Bradycardia present.  Pulmonary:     Effort: Pulmonary effort is normal.     Breath sounds: Normal breath sounds. No wheezing.  Neurological:     Mental Status: He is alert.     Lab Results  Component Value Date   WBC 7.3 06/12/2023   HGB 11.7 (L) 06/12/2023   HCT 35.4 (L) 06/12/2023   PLT 273 06/12/2023   GLUCOSE 98 03/13/2023   CHOL 132 04/03/2023   TRIG 95 04/03/2023   HDL 30 (L) 04/03/2023   LDLCALC 83 04/03/2023   ALT 27 11/28/2022   AST 26 11/28/2022   NA 139 03/13/2023   K 4.6 03/13/2023   CL 105 03/13/2023   CREATININE 0.98 03/13/2023   BUN 16 03/13/2023   CO2 21 03/13/2023   TSH 2.590 10/26/2020   PSA 3.34 04/18/2014   INR 1.10 07/18/2014   HGBA1C 6.0 (H) 05/22/2020     Assessment & Plan:  Anemia, unspecified type -     CBC -     Iron, TIBC and Ferritin Panel  Hereditary hemochromatosis (HCC) -     CBC -     Iron, TIBC and Ferritin Panel  Resistant hypertension Assessment & Plan: Uncontrolled.  Patient will continue his current medications.  I am adding Cardura for BPH but this should also aid hypertension.  Orders: -     Doxazosin Mesylate; Take 1 tablet (1 mg total) by mouth daily.  Dispense: 90 tablet; Refill: 1 -     Microalbumin / creatinine urine ratio -     CMP14+EGFR  Hyperlipemia, mixed -     Lipid panel  Benign prostatic hyperplasia with nocturia Assessment & Plan: New problem.  Uncontrolled.  Starting on Cardura.  Orders: -     Doxazosin Mesylate; Take 1 tablet (1 mg total) by mouth daily.  Dispense: 90 tablet; Refill: 1   Follow-up:  Return in about 3 months (around 03/08/2024).  Everlene Other DO [facility]

## 2023-12-07 NOTE — Assessment & Plan Note (Addendum)
 Uncontrolled.  Patient will continue his current medications.  I am adding Cardura for BPH but this should also aid hypertension.

## 2023-12-07 NOTE — Patient Instructions (Signed)
 I have added Doxazosin to help with the prostate and your BP.  Labs today.  Follow up in 3 months.

## 2023-12-09 ENCOUNTER — Other Ambulatory Visit: Payer: Self-pay

## 2023-12-09 DIAGNOSIS — D649 Anemia, unspecified: Secondary | ICD-10-CM

## 2023-12-09 DIAGNOSIS — R7989 Other specified abnormal findings of blood chemistry: Secondary | ICD-10-CM

## 2023-12-10 ENCOUNTER — Inpatient Hospital Stay: Payer: Medicare HMO | Attending: Hematology

## 2023-12-10 DIAGNOSIS — D649 Anemia, unspecified: Secondary | ICD-10-CM | POA: Diagnosis not present

## 2023-12-10 DIAGNOSIS — Z87891 Personal history of nicotine dependence: Secondary | ICD-10-CM | POA: Diagnosis not present

## 2023-12-10 DIAGNOSIS — R7989 Other specified abnormal findings of blood chemistry: Secondary | ICD-10-CM

## 2023-12-10 LAB — CBC WITH DIFFERENTIAL/PLATELET
Abs Immature Granulocytes: 0.03 10*3/uL (ref 0.00–0.07)
Basophils Absolute: 0.1 10*3/uL (ref 0.0–0.1)
Basophils Relative: 1 %
Eosinophils Absolute: 1 10*3/uL — ABNORMAL HIGH (ref 0.0–0.5)
Eosinophils Relative: 15 %
HCT: 39.6 % (ref 39.0–52.0)
Hemoglobin: 12.9 g/dL — ABNORMAL LOW (ref 13.0–17.0)
Immature Granulocytes: 0 %
Lymphocytes Relative: 30 %
Lymphs Abs: 2 10*3/uL (ref 0.7–4.0)
MCH: 30.5 pg (ref 26.0–34.0)
MCHC: 32.6 g/dL (ref 30.0–36.0)
MCV: 93.6 fL (ref 80.0–100.0)
Monocytes Absolute: 0.9 10*3/uL (ref 0.1–1.0)
Monocytes Relative: 14 %
Neutro Abs: 2.7 10*3/uL (ref 1.7–7.7)
Neutrophils Relative %: 40 %
Platelets: 282 10*3/uL (ref 150–400)
RBC: 4.23 MIL/uL (ref 4.22–5.81)
RDW: 16.1 % — ABNORMAL HIGH (ref 11.5–15.5)
WBC: 6.7 10*3/uL (ref 4.0–10.5)
nRBC: 0.3 % — ABNORMAL HIGH (ref 0.0–0.2)

## 2023-12-10 LAB — IRON AND TIBC
Iron: 169 ug/dL (ref 45–182)
Saturation Ratios: 44 % — ABNORMAL HIGH (ref 17.9–39.5)
TIBC: 386 ug/dL (ref 250–450)
UIBC: 217 ug/dL

## 2023-12-10 LAB — FERRITIN: Ferritin: 410 ng/mL — ABNORMAL HIGH (ref 24–336)

## 2023-12-16 NOTE — Progress Notes (Unsigned)
 [facility] 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 male returns for routine follow-up of elevated ferritin and normocytic anemia.  He was last seen on 06/18/2023 by me.  Denies any interval hospitalizations, surgeries or changes to his baseline health.  He reports feeling well today.  He has not needed a phlebotomy but has donated blood voluntarily from time to time last donation was over two years ago because of his anemia.  Reports the Red Cross recommends his hemoglobin to be 12.5 or greater.  Today, he reports appetite and energy levels about 100%.  Denies any pain.  Reports he cares for his wife with some severe cardiac issues but she is overall doing well.  He denies any abnormal joint pain, abdominal pain, fatigue, decreased appetite, or skin changes.  He does not take any iron supplements at home.  He does not drink alcohol.  He eats red meat and liver about once each week.  He denies any signs of blood loss such as epistaxis, hematemesis, hematochezia, or melena.   ASSESSMENT & PLAN:  1.  Elevated ferritin with compound heterozygosity for hemochromatosis (C282Y and H63D) - He reported that he used to donate blood at [facility].  However he was found to be anemic in August 2021 with hemoglobin 10.3.  His ferritin was 689 on 06/14/2020. - He was evaluated by GI and was started on iron pill around November 2021 through May 2022. - Elevated ferritin 689 (06/14/2020), 501 (10/26/2020), 587 (04/16/2021), 658 (07/05/2021).  Percent saturation varied from 22 to 48%. 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 - Ultrasound of the  abdomen on 09/02/2021 showed hepatic steatosis.   2.  Normocytic anemia, mild - He has very mild anemia  - Work-up showed normal B12, MMA, copper, folic acid.  SPEP negative.  Elevated iron as above. - He reported that he used to donate blood at [facility].  However he was found to be anemic in August 2021 with hemoglobin 10.3.  His ferritin was 689 on 06/14/2020. - He was evaluated by GI and was started on iron pill around November 2021 through May 2022. - Colonoscopy on 03/21/2015 with chronic diverticulosis.  Stool for occult blood was negative previously.   3.  Hypertension -Better controlled.  Blood pressure 151/60. - Patient reports that baseline SBP runs around 140-150 -Evaluated in [facility] [facility] ED in late May for syncopal episode thought to be due to dehydration and possibly Lyrica.  Lyrica was discontinued and Coreg was decreased.  No additional episodes since.  4.  Social/family history: - Lives at home with his wife and handles most of the household tasks due to her heart disease.  He worked in [employer].  No chemical exposure.  Non-smoker. - No family history of hemochromatosis.  No malignancies.  PLAN: 1. Anemia, unspecified type (Primary) - Labs from 12/10/2023 show elevated ferritin at 410, mild anemia with hemoglobin of 12.9 with normal MCV.  WBC is WNL.  Differential is normal.  Iron saturations elevated at 44% with normal TIBC. -Differentials include early MDS or anemia of chronic disease. -Continue every 31-month labs and evaluation. -  We discussed he can voluntarily donate blood when his hemoglobin is greater than 13.  2. Hereditary hemochromatosis (HCC) -Most recent labs from 12/10/2023 show elevated ferritin at 410, iron saturation 44% with normal TIBC. - No symptoms of systemic iron overload. - Elevated ferritin most likely multifactorial from fatty liver and compound heterozygosity for hemochromatosis. -No indication for phlebotomy or  iron chelation at this time. -Would not recommend blood donation at this time given he is slightly anemic at today's visit.  Hemoglobin is 12.9.  Would recommend hemoglobin greater than 13 for blood donation.  Avoid iron supplements and an iron rich diet.  Return to clinic in 6 months with repeat labs and visit.  PLAN SUMMARY: >> No indication for phlebotomy at this time.  >> Continue to hold blood donation until hemoglobin improves. >> RTC in 6 months for follow-up with labs a few days before.    REVIEW OF SYSTEMS:   Review of Systems  Constitutional:  Negative for fatigue.     PHYSICAL EXAM:  ECOG PERFORMANCE STATUS: 0 - Asymptomatic  There were no vitals filed for this visit. There were no vitals filed for this visit. Physical Exam Constitutional:      Appearance: Normal appearance.  Cardiovascular:     Rate and Rhythm: Normal rate and regular rhythm.  Pulmonary:     Effort: Pulmonary effort is normal.     Breath sounds: Normal breath sounds.  Abdominal:     General: Bowel sounds are normal.     Palpations: Abdomen is soft.  Musculoskeletal:        General: No swelling. Normal range of motion.  Neurological:     Mental Status: He is alert and oriented to person, place, and time. Mental status is at baseline.     PAST MEDICAL/SURGICAL HISTORY:  Past Medical History:  Diagnosis Date   Chronic hip pain    Left hip pain   ED (erectile dysfunction)    Essential hypertension    Hyperlipidemia    IFG (impaired fasting glucose)    Noncompliance    Normocytic anemia    Obesity    Osteoarthritis    Past Surgical History:  Procedure Laterality Date   BILATERAL KNEE ARTHROSCOPY     CHOLECYSTECTOMY  2010   COLONOSCOPY N/A 03/21/2015   Surgeon: Corbin Ade, MD;  Multiple anal papilla-1 prominent, normal-appearing rectal mucosa, scattered left-sided diverticula, the remainder of the exam was normal.  No recommendations to repeat colonoscopy.   HIP FRACTURE SURGERY  2009    LEFT HIP   KNEE ARTHROTOMY BILATERAL 50 + YRS AGO     REFRACTIVE SURGERY Bilateral    LASIK   TOTAL HIP ARTHROPLASTY Left 07/24/2014   Procedure: CONVERSION OF PREVIOUS HIP SURGERY LEFT HIP ARTHROPLASTY;  Surgeon: [name], MD;  Location: [facility];  Service: Orthopedics;  Laterality: Left;   TOTAL KNEE ARTHROPLASTY Right 02/15/2019   Procedure: TOTAL KNEE ARTHROPLASTY;  Surgeon: Durene Romans, MD;  Location: [facility];  Service: Orthopedics;  Laterality: Right;  70 mins    SOCIAL HISTORY:  Social History   Socioeconomic History   Marital status: Married    Spouse name: Alona Bene   Number of children: Not on file   Years of education: Not on file   Highest education level: 12th grade  Occupational History   Not on file  Tobacco Use   Smoking status: Former    Types: Pipe    Quit date: 1963    Years since quitting: 58.2  Smokeless tobacco: Never   Tobacco comments:    quit 40-50 years ago  Vaping Use   Vaping status: Never Used  Substance and Sexual Activity   Alcohol use: No    Comment: QUIT SMOKING AGE 63   Drug use: No   Sexual activity: Yes  Other Topics Concern   Not on file  Social History Narrative   Married x 8 years to [place]. Dated in HS, married separate people and both divorced re-met after 52 years, married in 2015.   Social Drivers of Corporate investment banker Strain: Low Risk  (04/10/2023)   Overall Financial Resource Strain (CARDIA)    Difficulty of Paying Living Expenses: Not hard at all  Food Insecurity: No Food Insecurity (04/10/2023)   Hunger Vital Sign    Worried About Running Out of Food in the Last Year: Never true    Ran Out of Food in the Last Year: Never true  Transportation Needs: No Transportation Needs (04/10/2023)   PRAPARE - Administrator, Civil Service (Medical): No    Lack of Transportation (Non-Medical): No  Physical Activity: Insufficiently Active (04/10/2023)   Exercise Vital Sign    Days of Exercise per Week: 1 day     Minutes of Exercise per Session: 20 min  Stress: No Stress Concern Present (04/10/2023)   [employer] of Occupational Health - Occupational Stress Questionnaire    Feeling of Stress : Only a little  Social Connections: Socially Integrated (04/10/2023)   Social Connection and Isolation Panel [NHANES]    Frequency of Communication with Friends and Family: Three times a week    Frequency of Social Gatherings with Friends and Family: Once a week    Attends Religious Services: More than 4 times per year    Active Member of [employer] or Organizations: Yes    Attends Banker Meetings: 1 to 4 times per year    Marital Status: Married  Catering manager Violence: Not At Risk (04/10/2023)   Humiliation, Afraid, Rape, and Kick questionnaire    Fear of Current or Ex-Partner: No    Emotionally Abused: No    Physically Abused: No    Sexually Abused: No    FAMILY HISTORY:  Family History  Problem Relation Age of Onset   Hypertension Mother    Diabetes Mother    Hypertension Father    Heart attack Father    Diabetes Sister    Colon cancer Neg Hx    Inflammatory bowel disease Neg Hx     CURRENT MEDICATIONS:  Outpatient Encounter Medications as of 12/17/2023  Medication Sig   amLODipine (NORVASC) 10 MG tablet TAKE 1 TABLET EVERY DAY   benazepril (LOTENSIN) 40 MG tablet Take 1 tablet (40 mg total) by mouth daily.   carvedilol (COREG) 3.125 MG tablet Take 1 tablet (3.125 mg total) by mouth 2 (two) times daily with a meal.   cloNIDine (CATAPRES) 0.2 MG tablet TAKE 1 TABLET TWICE DAILY   doxazosin (CARDURA) 1 MG tablet Take 1 tablet (1 mg total) by mouth daily.   gemfibrozil (LOPID) 600 MG tablet TAKE 1 TABLET TWICE DAILY BEFORE MEALS   rosuvastatin (CRESTOR) 5 MG tablet Take 1 tablet (5 mg total) by mouth 2 (two) times a week.   spironolactone (ALDACTONE) 25 MG tablet TAKE 1 TABLET EVERY DAY   No facility-administered encounter medications on file as of 12/17/2023.    ALLERGIES:   Allergies  Allergen Reactions   Lipitor [Atorvastatin]  Muscle and joint pain   Lisinopril     Other reaction(s): Unknown    LABORATORY DATA:  I have reviewed the labs as listed.  CBC    Component Value Date/Time   WBC 6.7 12/10/2023 0951   RBC 4.23 12/10/2023 0951   HGB 12.9 (L) 12/10/2023 0951   HGB 12.9 (L) 07/19/2021 0926   HCT 39.6 12/10/2023 0951   HCT 37.1 (L) 07/19/2021 0926   PLT 282 12/10/2023 0951   PLT 304 07/19/2021 0926   MCV 93.6 12/10/2023 0951   MCV 90 07/19/2021 0926   MCH 30.5 12/10/2023 0951   MCHC 32.6 12/10/2023 0951   RDW 16.1 (H) 12/10/2023 0951   RDW 15.1 07/19/2021 0926   LYMPHSABS 2.0 12/10/2023 0951   LYMPHSABS 1.6 07/19/2021 0926   MONOABS 0.9 12/10/2023 0951   EOSABS 1.0 (H) 12/10/2023 0951   EOSABS 0.7 (H) 07/19/2021 0926   BASOSABS 0.1 12/10/2023 0951   BASOSABS 0.1 07/19/2021 0926      Latest Ref Rng & Units 03/13/2023   12:08 PM 11/28/2022    8:52 AM 05/23/2022    8:13 AM  CMP  Glucose 70 - 99 mg/dL 98  161  096   BUN 8 - 27 mg/dL 16  21  21    Creatinine 0.76 - 1.27 mg/dL 0.45  4.09  8.11   Sodium 134 - 144 mmol/L 139  138  138   Potassium 3.5 - 5.2 mmol/L 4.6  3.5  4.2   Chloride 96 - 106 mmol/L 105  107  108   CO2 20 - 29 mmol/L 21  22  22    Calcium 8.6 - 10.2 mg/dL 9.3  9.2  9.6   Total Protein 6.5 - 8.1 g/dL  6.9  7.2   Total Bilirubin 0.3 - 1.2 mg/dL  0.5  0.5   Alkaline Phos 38 - 126 U/L  52  53   AST 15 - 41 U/L  26  18   ALT 0 - 44 U/L  27  19     DIAGNOSTIC IMAGING:  I have independently reviewed the relevant imaging and discussed with the patient.   WRAP UP:  All questions were answered. The patient knows to call the clinic with any problems, questions or concerns.  Medical decision making: Moderate  Time spent on visit: I spent 25 minutes dedicated to the care of this patient (face-to-face and non-face-to-face) on the date of the encounter to include what is described in the assessment and plan.  Durenda Hurt, NP 12/16/2023 2:28 PM

## 2023-12-17 ENCOUNTER — Inpatient Hospital Stay (HOSPITAL_BASED_OUTPATIENT_CLINIC_OR_DEPARTMENT_OTHER): Payer: Medicare HMO | Admitting: Oncology

## 2023-12-17 VITALS — BP 146/65 | HR 51 | Temp 97.4°F | Resp 17 | Ht 71.0 in | Wt 213.0 lb

## 2023-12-17 DIAGNOSIS — R7989 Other specified abnormal findings of blood chemistry: Secondary | ICD-10-CM | POA: Diagnosis not present

## 2023-12-17 DIAGNOSIS — Z87891 Personal history of nicotine dependence: Secondary | ICD-10-CM | POA: Diagnosis not present

## 2023-12-17 DIAGNOSIS — D649 Anemia, unspecified: Secondary | ICD-10-CM | POA: Diagnosis not present

## 2024-01-20 ENCOUNTER — Other Ambulatory Visit: Payer: Self-pay | Admitting: Family Medicine

## 2024-01-20 ENCOUNTER — Other Ambulatory Visit: Payer: Self-pay | Admitting: Cardiology

## 2024-02-20 ENCOUNTER — Other Ambulatory Visit: Payer: Self-pay | Admitting: Cardiology

## 2024-03-08 ENCOUNTER — Ambulatory Visit: Admitting: Family Medicine

## 2024-03-08 VITALS — BP 146/67 | HR 51 | Temp 98.4°F | Ht 71.0 in | Wt 214.4 lb

## 2024-03-08 DIAGNOSIS — D649 Anemia, unspecified: Secondary | ICD-10-CM

## 2024-03-08 DIAGNOSIS — R351 Nocturia: Secondary | ICD-10-CM | POA: Diagnosis not present

## 2024-03-08 DIAGNOSIS — N401 Enlarged prostate with lower urinary tract symptoms: Secondary | ICD-10-CM | POA: Diagnosis not present

## 2024-03-08 DIAGNOSIS — E782 Mixed hyperlipidemia: Secondary | ICD-10-CM

## 2024-03-08 DIAGNOSIS — I1A0 Resistant hypertension: Secondary | ICD-10-CM

## 2024-03-08 MED ORDER — AMLODIPINE BESYLATE 10 MG PO TABS: ORAL_TABLET | ORAL | 1 refills | Status: DC

## 2024-03-08 MED ORDER — SPIRONOLACTONE 25 MG PO TABS
25.0000 mg | ORAL_TABLET | Freq: Every day | ORAL | 3 refills | Status: AC
Start: 2024-03-08 — End: ?

## 2024-03-08 MED ORDER — GEMFIBROZIL 600 MG PO TABS: ORAL_TABLET | ORAL | 1 refills | Status: DC

## 2024-03-08 NOTE — Assessment & Plan Note (Signed)
 Improved.  Continue Cardura .

## 2024-03-08 NOTE — Patient Instructions (Signed)
Continue your meds.  Follow up in 6 months.

## 2024-03-08 NOTE — Progress Notes (Signed)
 Subjective:  Patient ID: Hector [name], male    DOB: 23-Jul-1941  Age: 83 y.o. MRN: [id number]  CC:  Follow up   HPI:  83 year old male presents for follow-up  Patient's BP is fairly well-controlled given advanced age.  Compliant with medications.  Denies chest pain or shortness of breath.  Nocturia has improved.  Getting up 1 time a night to urinate.  Tolerating doxazosin .  Anemia improved.  Follow-up with heme-onc.  Patient Active Problem List   Diagnosis Date Noted   BPH (benign prostatic hyperplasia) 12/07/2023   CAD (coronary artery disease) 06/09/2023   Aortic atherosclerosis (HCC) 06/09/2023   Resistant hypertension 01/12/2023   Hemochromatosis 12/10/2022   Anemia 10/24/2020   S/P right TKA 02/15/2019   Diverticulosis of colon without hemorrhage    Hyperlipemia, mixed 01/18/2013   Sensory neuropathy 01/18/2013    Social Hx   Social History   Socioeconomic History   Marital status: Married    Spouse name: Lenon Radar   Number of children: Not on file   Years of education: Not on file   Highest education level: 12th grade  Occupational History   Not on file  Tobacco Use   Smoking status: Former    Types: Pipe    Quit date: 1963    Years since quitting: 62.5   Smokeless tobacco: Never   Tobacco comments:    quit 40-50 years ago  Vaping Use   Vaping status: Never Used  Substance and Sexual Activity   Alcohol use: No    Comment: QUIT SMOKING AGE 8   Drug use: No   Sexual activity: Yes  Other Topics Concern   Not on file  Social History Narrative   Married x 8 years to [place]. Dated in HS, married separate people and both divorced re-met after 52 years, married in 2015.   Social Drivers of Corporate investment banker Strain: Low Risk  (03/07/2024)   Overall Financial Resource Strain (CARDIA)    Difficulty of Paying Living Expenses: Not very hard  Food Insecurity: No Food Insecurity (03/07/2024)   Hunger Vital Sign    Worried About Running Out of Food  in the Last Year: Never true    Ran Out of Food in the Last Year: Never true  Transportation Needs: No Transportation Needs (03/07/2024)   PRAPARE - Administrator, Civil Service (Medical): No    Lack of Transportation (Non-Medical): No  Physical Activity: Insufficiently Active (03/07/2024)   Exercise Vital Sign    Days of Exercise per Week: 2 days    Minutes of Exercise per Session: 20 min  Stress: No Stress Concern Present (03/07/2024)   [employer] of Occupational Health - Occupational Stress Questionnaire    Feeling of Stress: Not at all  Social Connections: Unknown (03/07/2024)   Social Connection and Isolation Panel    Frequency of Communication with Friends and Family: Not on file    Frequency of Social Gatherings with Friends and Family: Once a week    Attends Religious Services: More than 4 times per year    Active Member of [employer] or Organizations: No    Attends Engineer, structural: Not on file    Marital Status: Married    Review of Systems Per HPI  Objective:  BP (!) 146/67   Pulse (!) 51   Temp 98.4 F (36.9 C)   Ht 5' 11 (1.803 m)   Wt 214 lb 6.4 oz (97.3 kg)   SpO2 96%  BMI 29.90 kg/m      03/08/2024   11:08 AM 03/08/2024   10:59 AM 12/17/2023   10:05 AM  BP/Weight  Systolic BP 146 167 146  Diastolic BP 67 67 65  Wt. (Lbs)  214.4 213  BMI  29.9 kg/m2 29.71 kg/m2    Physical Exam Vitals and nursing note reviewed.  Constitutional:      General: He is not in acute distress.    Appearance: Normal appearance.  HENT:     Head: Normocephalic and atraumatic.   Eyes:     General:        Right eye: No discharge.        Left eye: No discharge.     Conjunctiva/sclera: Conjunctivae normal.    Cardiovascular:     Rate and Rhythm: Regular rhythm. Bradycardia present.  Pulmonary:     Effort: Pulmonary effort is normal.     Breath sounds: Normal breath sounds. No wheezing, rhonchi or rales.   Neurological:     Mental Status: He  is alert.   Psychiatric:        Mood and Affect: Mood normal.        Behavior: Behavior normal.     Lab Results  Component Value Date   WBC 6.7 12/10/2023   HGB 12.9 (L) 12/10/2023   HCT 39.6 12/10/2023   PLT 282 12/10/2023   GLUCOSE 98 03/13/2023   CHOL 132 04/03/2023   TRIG 95 04/03/2023   HDL 30 (L) 04/03/2023   LDLCALC 83 04/03/2023   ALT 27 11/28/2022   AST 26 11/28/2022   NA 139 03/13/2023   K 4.6 03/13/2023   CL 105 03/13/2023   CREATININE 0.98 03/13/2023   BUN 16 03/13/2023   CO2 21 03/13/2023   TSH 2.590 10/26/2020   PSA 3.34 04/18/2014   INR 1.10 07/18/2014   HGBA1C 6.0 (H) 05/22/2020     Assessment & Plan:  Resistant hypertension Assessment & Plan: Fair control.  Continue current medications.  Orders: -     amLODIPine  Besylate; TAKE 1 TABLET EVERY DAY  Dispense: 90 tablet; Refill: 1 -     Spironolactone ; Take 1 tablet (25 mg total) by mouth daily.  Dispense: 90 tablet; Refill: 3  Hyperlipemia, mixed -     Gemfibrozil ; TAKE 1 TABLET TWICE DAILY BEFORE MEALS  Dispense: 180 tablet; Refill: 1  Anemia, unspecified type Assessment & Plan: Improved.   Benign prostatic hyperplasia with nocturia Assessment & Plan: Improved.  Continue Cardura .     Follow-up:  6 months  Sreenidhi Ganson Debrah Fan DO [facility]

## 2024-03-08 NOTE — Assessment & Plan Note (Signed)
Fair control. Continue current medications. 

## 2024-03-08 NOTE — Assessment & Plan Note (Signed)
 Improved

## 2024-04-02 ENCOUNTER — Other Ambulatory Visit: Payer: Self-pay | Admitting: Cardiology

## 2024-04-15 ENCOUNTER — Ambulatory Visit: Payer: Medicare HMO

## 2024-04-22 ENCOUNTER — Ambulatory Visit

## 2024-04-22 VITALS — Ht 71.0 in | Wt 214.0 lb

## 2024-04-22 DIAGNOSIS — Z0000 Encounter for general adult medical examination without abnormal findings: Secondary | ICD-10-CM | POA: Diagnosis not present

## 2024-04-22 NOTE — Progress Notes (Signed)
 Subjective:   Hector [name] is a 83 y.o. who presents for a Medicare Wellness preventive visit.  As a reminder, Annual Wellness Visits don't include a physical exam, and some assessments may be limited, especially if this visit is performed virtually. We may recommend an in-person follow-up visit with your provider if needed.  Visit Complete: Virtual I connected with  Hector [name] on 04/22/24 by a audio enabled telemedicine application and verified that I am speaking with the correct person using two identifiers.  Patient Location: Home  Provider Location: Home Office  I discussed the limitations of evaluation and management by telemedicine. The patient expressed understanding and agreed to proceed.  Vital Signs: Because this visit was a virtual/telehealth visit, some criteria may be missing or patient reported. Any vitals not documented were not able to be obtained and vitals that have been documented are patient reported.  VideoDeclined- This patient declined Librarian, academic. Therefore the visit was completed with audio only.  Persons Participating in Visit: Patient.  AWV Questionnaire: No: Patient Medicare AWV questionnaire was not completed prior to this visit.  Cardiac Risk Factors include: advanced age (>22men, >14 women);dyslipidemia;hypertension;male gender     Objective:    Today's Vitals   04/22/24 0853  Weight: 214 lb (97.1 kg)  Height: 5' 11 (1.803 m)   Body mass index is 29.85 kg/m.     04/22/2024    8:58 AM 12/17/2023   10:07 AM 06/18/2023   10:31 AM 04/10/2023   12:54 PM 12/05/2022    9:41 AM 05/30/2022    8:10 AM 04/01/2022   11:43 AM  Advanced Directives  Does Patient Have a Medical Advance Directive? No No No No Yes Yes Yes  Type of Agricultural consultant;Living will Living will;Healthcare Power of [employer] Power of [place];Living will  Does patient want to make changes to medical  advance directive?     No - Patient declined No - Patient declined   Copy of Healthcare Power of Attorney in Chart?     No - copy requested No - copy requested No - copy requested  Would patient like information on creating a medical advance directive? Yes (MAU/Ambulatory/Procedural Areas - Information given) No - Patient declined No - Patient declined No - Patient declined       Current Medications (verified) Outpatient Encounter Medications as of 04/22/2024  Medication Sig   amLODipine  (NORVASC ) 10 MG tablet TAKE 1 TABLET EVERY DAY   benazepril  (LOTENSIN ) 40 MG tablet TAKE 1 TABLET EVERY DAY   carvedilol  (COREG ) 3.125 MG tablet TAKE 1 TABLET TWICE DAILY WITH MEALS   cloNIDine  (CATAPRES ) 0.2 MG tablet TAKE 1 TABLET TWICE DAILY   doxazosin  (CARDURA ) 1 MG tablet Take 1 tablet (1 mg total) by mouth daily.   gemfibrozil  (LOPID ) 600 MG tablet TAKE 1 TABLET TWICE DAILY BEFORE MEALS   spironolactone  (ALDACTONE ) 25 MG tablet Take 1 tablet (25 mg total) by mouth daily.   No facility-administered encounter medications on file as of 04/22/2024.    Allergies (verified) Lisinopril   History: Past Medical History:  Diagnosis Date   Allergy    Chronic hip pain    Left hip pain   ED (erectile dysfunction)    Essential hypertension    GERD (gastroesophageal reflux disease)    Hyperlipidemia    IFG (impaired fasting glucose)    Noncompliance    Normocytic anemia    Obesity    Osteoarthritis  Past Surgical History:  Procedure Laterality Date   BILATERAL KNEE ARTHROSCOPY     CHOLECYSTECTOMY  2010   COLONOSCOPY N/A 03/21/2015   Surgeon: Lamar CHRISTELLA Hollingshead, MD;  Multiple anal papilla-1 prominent, normal-appearing rectal mucosa, scattered left-sided diverticula, the remainder of the exam was normal.  No recommendations to repeat colonoscopy.   EYE SURGERY     HERNIA REPAIR     HIP FRACTURE SURGERY  2009   LEFT HIP   JOINT REPLACEMENT     KNEE ARTHROTOMY BILATERAL 50 + YRS AGO     REFRACTIVE  SURGERY Bilateral    LASIK   TOTAL HIP ARTHROPLASTY Left 07/24/2014   Procedure: CONVERSION OF PREVIOUS HIP SURGERY LEFT HIP ARTHROPLASTY;  Surgeon: Donnice JONETTA Car, MD;  Location: [facility];  Service: Orthopedics;  Laterality: Left;   TOTAL KNEE ARTHROPLASTY Right 02/15/2019   Procedure: TOTAL KNEE ARTHROPLASTY;  Surgeon: Car Donnice, MD;  Location: [facility];  Service: Orthopedics;  Laterality: Right;  70 mins   Family History  Problem Relation Age of Onset   Hypertension Mother    Diabetes Mother    Hypertension Father    Heart attack Father    Diabetes Sister    Colon cancer Neg Hx    Inflammatory bowel disease Neg Hx    Social History   Socioeconomic History   Marital status: Married    Spouse name: Merlynn   Number of children: Not on file   Years of education: Not on file   Highest education level: 12th grade  Occupational History   Not on file  Tobacco Use   Smoking status: Former    Current packs/day: 0.00    Types: Pipe, Cigarettes    Quit date: 09/22/1961    Years since quitting: 62.6   Smokeless tobacco: Never   Tobacco comments:    quit 40-50 years ago  Vaping Use   Vaping status: Never Used  Substance and Sexual Activity   Alcohol use: No    Comment: QUIT SMOKING AGE 56   Drug use: No   Sexual activity: Not Currently  Other Topics Concern   Not on file  Social History Narrative   Married x 8 years to [place]. Dated in HS, married separate people and both divorced re-met after 52 years, married in 2015.   Social Drivers of Corporate investment banker Strain: Low Risk  (04/22/2024)   Overall Financial Resource Strain (CARDIA)    Difficulty of Paying Living Expenses: Not hard at all  Food Insecurity: No Food Insecurity (04/22/2024)   Hunger Vital Sign    Worried About Running Out of Food in the Last Year: Never true    Ran Out of Food in the Last Year: Never true  Transportation Needs: No Transportation Needs (04/22/2024)   PRAPARE - Scientist, research (physical sciences) (Medical): No    Lack of Transportation (Non-Medical): No  Physical Activity: Insufficiently Active (04/22/2024)   Exercise Vital Sign    Days of Exercise per Week: 2 days    Minutes of Exercise per Session: 20 min  Stress: No Stress Concern Present (04/22/2024)   [employer] of Occupational Health - Occupational Stress Questionnaire    Feeling of Stress: Not at all  Social Connections: Moderately Integrated (04/22/2024)   Social Connection and Isolation Panel    Frequency of Communication with Friends and Family: Three times a week    Frequency of Social Gatherings with Friends and Family: Once a week  Attends Religious Services: More than 4 times per year    Active Member of Clubs or Organizations: No    Attends Banker Meetings: Never    Marital Status: Married    Tobacco Counseling Counseling given: Not Answered Tobacco comments: quit 40-50 years ago    Clinical Intake:  Pre-visit preparation completed: Yes  Pain : No/denies pain  Diabetes: No  Lab Results  Component Value Date   HGBA1C 6.0 (H) 05/22/2020   HGBA1C 6.4 (H) 01/09/2020   HGBA1C 6.1 (A) 12/03/2018     How often do you need to have someone help you when you read instructions, pamphlets, or other written materials from your doctor or pharmacy?: 1 - Never  Interpreter Needed?: No  Information entered by :: Charmaine Bloodgood LPN   Activities of Daily Living     04/22/2024    8:54 AM  In your present state of health, do you have any difficulty performing the following activities:  Hearing? 0  Vision? 0  Difficulty concentrating or making decisions? 0  Walking or climbing stairs? 0  Dressing or bathing? 0  Doing errands, shopping? 0  Preparing Food and eating ? N  Using the Toilet? N  In the past six months, have you accidently leaked urine? N  Do you have problems with loss of bowel control? N  Managing your Medications? N  Managing your Finances? N  Housekeeping or  managing your Housekeeping? N    Patient Care Team: Cook, Jayce G, DO as PCP - General (Family Medicine) Debera Jayson MATSU, MD as PCP - Cardiology (Cardiology) Shaaron Lamar HERO, MD as Consulting Physician (Gastroenterology) Rogers Hai, MD as Medical Oncologist (Hematology) Gladis Gearing, OD (Optometry)  I have updated your Care Teams any recent Medical Services you may have received from other providers in the past year.     Assessment:   This is a routine wellness examination for Moiz.  Hearing/Vision screen Hearing Screening - Comments:: Wears hearing aids  Vision Screening - Comments:: Wears rx glasses - up to date with routine eye exams with [facility] [place]    Goals Addressed             This Visit's Progress    Maintain health and independence   On track      Depression Screen     04/22/2024    8:55 AM 03/08/2024   11:06 AM 12/07/2023    9:27 AM 06/09/2023   10:13 AM 04/10/2023   12:55 PM 02/11/2023    8:48 AM 01/12/2023    9:18 AM  PHQ 2/9 Scores  PHQ - 2 Score 0 0 0 0 0 0 0  PHQ- 9 Score 0 0 0 1 0 0 1    Fall Risk     04/22/2024    8:58 AM 03/08/2024   11:06 AM 12/07/2023    9:26 AM 06/09/2023   10:13 AM 04/10/2023    1:02 PM  Fall Risk   Falls in the past year? 0 0 0 0 0  Number falls in past yr: 0    0  Injury with Fall? 0    0  Risk for fall due to : No Fall Risks    No Fall Risks  Follow up Falls prevention discussed;Education provided;Falls evaluation completed    Falls prevention discussed    MEDICARE RISK AT HOME:  Medicare Risk at Home Any stairs in or around the home?: No If so, are there any without handrails?: No Home free  of loose throw rugs in walkways, pet beds, electrical cords, etc?: Yes Adequate lighting in your home to reduce risk of falls?: Yes Life alert?: No Use of a cane, walker or w/c?: No Grab bars in the bathroom?: Yes Shower chair or bench in shower?: No Elevated toilet seat or a handicapped toilet?: Yes  TIMED UP  AND GO:  Was the test performed?  No  Cognitive Function: Declined/Normal: No cognitive concerns noted by patient or family. Patient alert, oriented, able to answer questions appropriately and recall recent events. No signs of memory loss or confusion.        04/10/2023   12:54 PM 04/01/2022   11:48 AM  6CIT Screen  What Year? 0 points 0 points  What month? 0 points 0 points  What time? 0 points 0 points  Count back from 20 0 points 0 points  Months in reverse 0 points 0 points  Repeat phrase 0 points 0 points  Total Score 0 points 0 points    Immunizations Immunization History  Administered Date(s) Administered   Influenza, High Dose Seasonal PF 06/24/2019   Influenza,inj,Quad PF,6+ Mos 05/28/2016, 06/01/2017, 06/03/2018   Influenza-Unspecified 07/13/2014, 07/16/2015, 06/06/2020   Moderna SARS-COV2 Booster Vaccination 06/27/2020   Moderna Sars-Covid-2 Vaccination 12/22/2019, 01/21/2020, 07/17/2020   PNEUMOCOCCAL CONJUGATE-20 09/21/2023   Pneumococcal Conjugate-13 08/07/2015   Pneumococcal Polysaccharide-23 07/23/2012, 07/16/2015   RSV,unspecified 08/22/2022   Zoster Recombinant(Shingrix) 03/21/2020   Zoster, Live 08/14/2008    Screening Tests Health Maintenance  Topic Date Due   DTaP/Tdap/Td (1 - Tdap) Never done   Zoster Vaccines- Shingrix (2 of 2) 05/16/2020   COVID-19 Vaccine (5 - 2024-25 season) 05/24/2023   INFLUENZA VACCINE  04/22/2024   Medicare Annual Wellness (AWV)  04/22/2025   Pneumococcal Vaccine: 50+ Years  Completed   Hepatitis B Vaccines  Aged Out   HPV VACCINES  Aged Out   Meningococcal B Vaccine  Aged Out    Health Maintenance  Health Maintenance Due  Topic Date Due   DTaP/Tdap/Td (1 - Tdap) Never done   Zoster Vaccines- Shingrix (2 of 2) 05/16/2020   COVID-19 Vaccine (5 - 2024-25 season) 05/24/2023   INFLUENZA VACCINE  04/22/2024    Additional Screening:  Vision Screening: Recommended annual ophthalmology exams for early detection of  glaucoma and other disorders of the eye. Would you like a referral to an eye doctor? No    Dental Screening: Recommended annual dental exams for proper oral hygiene  Community Resource Referral / Chronic Care Management: CRR required this visit?  No   CCM required this visit?  No   Plan:    I have personally reviewed and noted the following in the patient's chart:   Medical and social history Use of alcohol, tobacco or illicit drugs  Current medications and supplements including opioid prescriptions. Patient is not currently taking opioid prescriptions. Functional ability and status Nutritional status Physical activity Advanced directives List of other physicians Hospitalizations, surgeries, and ER visits in previous 12 months Vitals Screenings to include cognitive, depression, and falls Referrals and appointments  In addition, I have reviewed and discussed with patient certain preventive protocols, quality metrics, and best practice recommendations. A written personalized care plan for preventive services as well as general preventive health recommendations were provided to patient.   Lavelle Pfeiffer [place], [place]   09/29/7972   After Visit Summary: (MyChart) Due to this being a telephonic visit, the after visit summary with patients personalized plan was offered to patient via MyChart  Notes: Nothing significant to report at this time.

## 2024-04-22 NOTE — Patient Instructions (Signed)
 Hector [name] , Thank you for taking time out of your busy schedule to complete your Annual Wellness Visit with me. I enjoyed our conversation and look forward to speaking with you again next year. I, as well as your care team,  appreciate your ongoing commitment to your health goals. Please review the following plan we discussed and let me know if I can assist you in the future. Your Game plan/ To Do List     Follow up Visits: We will see or speak with you next year for your Next Medicare AWV with our clinical staff Have you seen your provider in the last 6 months (3 months if uncontrolled diabetes)? Yes  Clinician Recommendations:  Aim for 30 minutes of exercise or brisk walking, 6-8 glasses of water , and 5 servings of fruits and vegetables each day.       This is a list of the screenings recommended for you:  Health Maintenance  Topic Date Due   DTaP/Tdap/Td vaccine (1 - Tdap) Never done   Zoster (Shingles) Vaccine (2 of 2) 05/16/2020   COVID-19 Vaccine (5 - 2024-25 season) 05/24/2023   Flu Shot  04/22/2024   Medicare Annual Wellness Visit  04/22/2025   Pneumococcal Vaccine for age over 27  Completed   Hepatitis B Vaccine  Aged Out   HPV Vaccine  Aged Out   Meningitis B Vaccine  Aged Out    Advanced directives: (ACP Link)Information on Advanced Care Planning can be found at [employer] Advance Health Care Directives Advance Health Care Directives. [email or web address]   Advance Care Planning is important because it:  [x]  Makes sure you receive the medical care that is consistent with your values, goals, and preferences  [x]  It provides guidance to your family and loved ones and reduces their decisional burden about whether or not they are making the right decisions based on your wishes.  Follow the link provided in your after visit summary or read over the paperwork we have mailed to you to help you started getting your Advance Directives in place. If you need assistance  in completing these, please reach out to us  so that we can help you!  See attachments for Preventive Care and Fall Prevention Tips.

## 2024-04-23 ENCOUNTER — Other Ambulatory Visit: Payer: Self-pay | Admitting: Family Medicine

## 2024-04-25 ENCOUNTER — Other Ambulatory Visit: Payer: Self-pay | Admitting: Cardiology

## 2024-05-19 ENCOUNTER — Other Ambulatory Visit: Payer: Self-pay | Admitting: Cardiology

## 2024-06-01 ENCOUNTER — Ambulatory Visit: Attending: Cardiology | Admitting: Cardiology

## 2024-06-01 ENCOUNTER — Encounter: Payer: Self-pay | Admitting: Cardiology

## 2024-06-01 VITALS — BP 120/70 | HR 46 | Ht 71.0 in | Wt 206.0 lb

## 2024-06-01 DIAGNOSIS — I10 Essential (primary) hypertension: Secondary | ICD-10-CM | POA: Diagnosis not present

## 2024-06-01 DIAGNOSIS — R001 Bradycardia, unspecified: Secondary | ICD-10-CM

## 2024-06-01 DIAGNOSIS — I700 Atherosclerosis of aorta: Secondary | ICD-10-CM

## 2024-06-01 DIAGNOSIS — I2510 Atherosclerotic heart disease of native coronary artery without angina pectoris: Secondary | ICD-10-CM

## 2024-06-01 DIAGNOSIS — E782 Mixed hyperlipidemia: Secondary | ICD-10-CM

## 2024-06-01 MED ORDER — ROSUVASTATIN CALCIUM 5 MG PO TABS
5.0000 mg | ORAL_TABLET | ORAL | 6 refills | Status: AC
Start: 2024-06-02 — End: 2024-08-31

## 2024-06-01 NOTE — Patient Instructions (Addendum)
 Medication Instructions:   Take Crestor  5 mg two times a week  Stop Taking Coreg    Labwork: None today  Testing/Procedures: None today  Follow-Up: 1 year  Any Other Special Instructions Will Be Listed Below (If Applicable).  If you need a refill on your cardiac medications before your next appointment, please call your pharmacy.

## 2024-06-01 NOTE — Progress Notes (Signed)
    Cardiology Office Note  Date: 06/01/2024   ID: Hector [name], Hector [name] Apr 21, 1941, MRN [id number]  History of Present Illness: Hector [name] is an 83 y.o. male last seen in July 2024.  He is here for a follow-up visit.  He does not report any exertional chest pain, no increasing dyspnea on exertion with typical activities.  No palpitations or syncope.  He does have some leg pain related to what sounds like spinal stenosis based on description.  He had a health screening done through his church indicating relatively mild atherosclerotic burden.  Medications reviewed.  He reports compliance with current regimen.  We talked again today about considering at least low-dose statin therapy in the setting of atherosclerosis.  He had prior myalgias on Lipitor.  His last LDL was 83.  I reviewed his ECG today which shows sinus bradycardia with prolonged PR interval, right bundle branch block, and left anterior fascicular block.  Physical Exam: VS:  BP 120/70 (BP Location: Right Arm, Cuff Size: Normal)   Pulse (!) 46   Ht 5' 11 (1.803 m)   Wt 206 lb (93.4 kg)   SpO2 96%   BMI 28.73 kg/m , BMI Body mass index is 28.73 kg/m.  Wt Readings from Last 3 Encounters:  06/01/24 206 lb (93.4 kg)  04/22/24 214 lb (97.1 kg)  03/08/24 214 lb 6.4 oz (97.3 kg)    General: Patient appears comfortable at rest. HEENT: Conjunctiva and lids normal. Neck: Supple, no elevated JVP or carotid bruits. Lungs: Clear to auscultation, nonlabored breathing at rest. Cardiac: Regular rate and rhythm, no S3 or significant systolic murmur. Extremities: No pitting edema.  ECG:  An ECG dated 02/12/2023 was personally reviewed today and demonstrated:  Sinus bradycardia with prolonged PR interval and right bundle-branch block.  Labwork: 12/10/2023: Hemoglobin 12.9; Platelets 282     Component Value Date/Time   CHOL 132 04/03/2023 0841   CHOL 107 05/22/2020 0830   TRIG 95 04/03/2023 0841   HDL 30 (L) 04/03/2023 0841    HDL 32 (L) 05/22/2020 0830   CHOLHDL 4.4 04/03/2023 0841   VLDL 19 04/03/2023 0841   LDLCALC 83 04/03/2023 0841   LDLCALC 59 05/22/2020 0830   Other Studies Reviewed Today:  No interval cardiac testing for review today.  Assessment and Plan:  1.  Primary hypertension.  Blood pressure well-controlled today.  He continues to track this intermittently at home.  Continue Norvasc  10 mg daily, Lotensin  40 mg daily, Aldactone  25 mg daily and clonidine  0.2 mg twice daily.   2.  History of sinus bradycardia and conduction system disease.  Plan to stop Coreg  completely as part of his antihypertensive regimen.   3.  Incidentally noted coronary artery calcification and aortic atherosclerosis by CT imaging.  He is currently on Lopid  600 mg daily.  He does have a history of myalgias on Lipitor in the past.  We will try Crestor  5 mg twice weekly and if tolerated can reassess lipids later with PCP.  Disposition:  Follow up 1 year.  Signed, Jayson JUDITHANN Sierras, M.D., F.A.C.C. [facility]

## 2024-06-07 ENCOUNTER — Telehealth: Payer: Self-pay | Admitting: Cardiology

## 2024-06-07 NOTE — Telephone Encounter (Signed)
 Pt called 3xs and letter mailed. No response from pt. Deleting recall.

## 2024-06-10 ENCOUNTER — Inpatient Hospital Stay: Attending: Oncology

## 2024-06-10 DIAGNOSIS — D649 Anemia, unspecified: Secondary | ICD-10-CM | POA: Diagnosis not present

## 2024-06-10 DIAGNOSIS — R7989 Other specified abnormal findings of blood chemistry: Secondary | ICD-10-CM

## 2024-06-10 LAB — CBC WITH DIFFERENTIAL/PLATELET
Abs Immature Granulocytes: 0.04 K/uL (ref 0.00–0.07)
Basophils Absolute: 0.1 K/uL (ref 0.0–0.1)
Basophils Relative: 1 %
Eosinophils Absolute: 1.8 K/uL — ABNORMAL HIGH (ref 0.0–0.5)
Eosinophils Relative: 24 %
HCT: 33.2 % — ABNORMAL LOW (ref 39.0–52.0)
Hemoglobin: 11.2 g/dL — ABNORMAL LOW (ref 13.0–17.0)
Immature Granulocytes: 1 %
Lymphocytes Relative: 22 %
Lymphs Abs: 1.7 K/uL (ref 0.7–4.0)
MCH: 31.5 pg (ref 26.0–34.0)
MCHC: 33.7 g/dL (ref 30.0–36.0)
MCV: 93.3 fL (ref 80.0–100.0)
Monocytes Absolute: 0.8 K/uL (ref 0.1–1.0)
Monocytes Relative: 10 %
Neutro Abs: 3.3 K/uL (ref 1.7–7.7)
Neutrophils Relative %: 42 %
Platelets: 274 K/uL (ref 150–400)
RBC: 3.56 MIL/uL — ABNORMAL LOW (ref 4.22–5.81)
RDW: 17.2 % — ABNORMAL HIGH (ref 11.5–15.5)
WBC: 7.6 K/uL (ref 4.0–10.5)
nRBC: 0 % (ref 0.0–0.2)

## 2024-06-10 LAB — IRON AND TIBC
Iron: 187 ug/dL — ABNORMAL HIGH (ref 45–182)
Saturation Ratios: 50 % — ABNORMAL HIGH (ref 17.9–39.5)
TIBC: 372 ug/dL (ref 250–450)
UIBC: 185 ug/dL

## 2024-06-10 LAB — FERRITIN: Ferritin: 390 ng/mL — ABNORMAL HIGH (ref 24–336)

## 2024-06-17 ENCOUNTER — Inpatient Hospital Stay: Admitting: Oncology

## 2024-06-17 VITALS — BP 137/77 | HR 51 | Temp 98.1°F | Resp 18 | Wt 205.4 lb

## 2024-06-17 DIAGNOSIS — D649 Anemia, unspecified: Secondary | ICD-10-CM | POA: Diagnosis not present

## 2024-06-17 NOTE — Assessment & Plan Note (Addendum)
-   Labs from 06/10/2024 show iron saturation of 50%, ferritin 390 with a hemoglobin of 11.2.  MCV is normal. -Differentials include early MDS or anemia of chronic disease. -Continue every 12-month labs and evaluation. -We discussed he can voluntarily donate blood when his hemoglobin is greater than 13.

## 2024-06-17 NOTE — Assessment & Plan Note (Addendum)
-  Most recent labs from 06/10/2024 show improvement of his ferritin to 390, iron saturation 50% with a hemoglobin of 11.2.  TIBC normal. - No symptoms of systemic iron overload. - Elevated ferritin most likely multifactorial from fatty liver and compound heterozygosity for hemochromatosis. -No indication for phlebotomy or iron chelation at this time. -Would not recommend blood donation at this time given he is slightly anemic at today's visit.  Would recommend hemoglobin greater than 13 for blood donation.  Avoid iron supplements and an iron rich diet.  Return to clinic in 6 months with repeat labs and visit.

## 2024-06-17 NOTE — Progress Notes (Signed)
 [facility] OFFICE PROGRESS NOTE  [name], Hector G, DO  ASSESSMENT & PLAN:    Assessment & Plan Anemia, unspecified type - Labs from 06/10/2024 show iron saturation of 50%, ferritin 390 with a hemoglobin of 11.2.  MCV is normal. -Differentials include early MDS or anemia of chronic disease. -Continue every 23-month labs and evaluation. -We discussed he can voluntarily donate blood when his hemoglobin is greater than 13.   Hereditary hemochromatosis -Most recent labs from 06/10/2024 show improvement of his ferritin to 390, iron saturation 50% with a hemoglobin of 11.2.  TIBC normal. - No symptoms of systemic iron overload. - Elevated ferritin most likely multifactorial from fatty liver and compound heterozygosity for hemochromatosis. -No indication for phlebotomy or iron chelation at this time. -Would not recommend blood donation at this time given he is slightly anemic at today's visit.  Would recommend hemoglobin greater than 13 for blood donation.  Avoid iron supplements and an iron rich diet.  Return to clinic in 6 months with repeat labs and visit.  Orders Placed This Encounter  Procedures   Iron and TIBC ([facility] ONLY)    Standing Status:   Future    Expected Date:   12/10/2024    Expiration Date:   06/16/2025   Ferritin    Standing Status:   Future    Expected Date:   12/10/2024    Expiration Date:   06/16/2025   CBC with Differential    Standing Status:   Future    Expected Date:   12/10/2024    Expiration Date:   06/16/2025    INTERVAL HISTORY: Hector [name] 83 y.o. male returns for routine follow-up of elevated ferritin and normocytic anemia.     Denies any interval hospitalizations, surgeries or changes to his baseline health.  He reports feeling well today.   He has not needed a phlebotomy but has donated blood voluntarily from time to time last donation was over two years ago because of his anemia.  Reports the [employer] recommends his  hemoglobin to be 12.5 or greater.   Today, he reports appetite and energy levels about 100%.  Denies any pain.  Reports he cares for his wife with some severe cardiac issues but she is overall doing well.  Reports insomnia at times.  Occasionally will have loose stools but no diarrhea.  He denies any abnormal joint pain, abdominal pain, fatigue, decreased appetite, or skin changes.  He does not take any iron supplements at home.  He does not drink alcohol.  He eats red meat and liver about once each week.  He denies any signs of blood loss such as epistaxis, hematemesis, hematochezia, or melena.   We reviewed CBC, ferritin and iron panel.  SUMMARY OF HEMATOLOGIC HISTORY: Oncology History Overview Note  1.  Elevated ferritin with compound heterozygosity for hemochromatosis (C282Y and H63D) - He reported that he used to donate blood at [facility].  However he was found to be anemic in August 2021 with hemoglobin 10.3.  His ferritin was 689 on 06/14/2020. - He was evaluated by GI and was started on iron pill around November 2021 through May 2022. - Elevated ferritin 689 (06/14/2020), 501 (10/26/2020), 587 (04/16/2021), 658 (07/05/2021).  Percent saturation varied from 22 to 48%. 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 - Ultrasound of the abdomen on 09/02/2021 showed hepatic steatosis.   2.  Normocytic anemia, mild - He has very mild anemia  - Work-up  showed normal B12, MMA, copper , folic acid .  SPEP negative.  Elevated iron as above. - He reported that he used to donate blood at [facility].  However he was found to be anemic in August 2021 with hemoglobin 10.3.  His ferritin was 689 on 06/14/2020. - He was evaluated by GI and was started on iron pill around November 2021 through May 2022. - Colonoscopy on 03/21/2015 with chronic diverticulosis.  Stool for occult blood was negative previously.   3.  Hypertension -Better controlled.  Blood pressure 151/60. - Patient  reports that baseline SBP runs around 140-150 -Evaluated in [facility] in late May for syncopal episode thought to be due to dehydration and possibly Lyrica .  Lyrica  was discontinued and Coreg  was decreased.  No additional episodes since.   4.  Social/family history: - Lives at home with his wife and handles most of the household tasks due to her heart disease.  He worked in [employer].  No chemical exposure.  Non-smoker. - No family history of hemochromatosis.  No malignancies.    No history exists.     CBC    Component Value Date/Time   WBC 7.6 06/10/2024 0932   RBC 3.56 (L) 06/10/2024 0932   HGB 11.2 (L) 06/10/2024 0932   HGB 12.9 (L) 07/19/2021 0926   HCT 33.2 (L) 06/10/2024 0932   HCT 37.1 (L) 07/19/2021 0926   PLT 274 06/10/2024 0932   PLT 304 07/19/2021 0926   MCV 93.3 06/10/2024 0932   MCV 90 07/19/2021 0926   MCH 31.5 06/10/2024 0932   MCHC 33.7 06/10/2024 0932   RDW 17.2 (H) 06/10/2024 0932   RDW 15.1 07/19/2021 0926   LYMPHSABS 1.7 06/10/2024 0932   LYMPHSABS 1.6 07/19/2021 0926   MONOABS 0.8 06/10/2024 0932   EOSABS 1.8 (H) 06/10/2024 0932   EOSABS 0.7 (H) 07/19/2021 0926   BASOSABS 0.1 06/10/2024 0932   BASOSABS 0.1 07/19/2021 0926       Latest Ref Rng & Units 03/13/2023   12:08 PM 11/28/2022    8:52 AM 05/23/2022    8:13 AM  CMP  Glucose 70 - 99 mg/dL 98  865  872   BUN 8 - 27 mg/dL 16  21  21    Creatinine 0.76 - 1.27 mg/dL 9.01  8.88  8.89   Sodium 134 - 144 mmol/L 139  138  138   Potassium 3.5 - 5.2 mmol/L 4.6  3.5  4.2   Chloride 96 - 106 mmol/L 105  107  108   CO2 20 - 29 mmol/L 21  22  22    Calcium  8.6 - 10.2 mg/dL 9.3  9.2  9.6   Total Protein 6.5 - 8.1 g/dL  6.9  7.2   Total Bilirubin 0.3 - 1.2 mg/dL  0.5  0.5   Alkaline Phos 38 - 126 U/L  52  53   AST 15 - 41 U/L  26  18   ALT 0 - 44 U/L  27  19      Lab Results  Component Value Date   FERRITIN 390 (H) 06/10/2024   VITAMINB12 306 08/23/2021    There  were no vitals filed for this visit.  Review of System:  Review of Systems  Constitutional:  Positive for malaise/fatigue.  Gastrointestinal:  Negative for abdominal pain, blood in stool, constipation, diarrhea and melena.  Musculoskeletal:  Negative for joint pain.  Psychiatric/Behavioral:  The patient has insomnia.     Physical Exam: Physical  Exam Constitutional:      Appearance: Normal appearance.  HENT:     Head: Normocephalic and atraumatic.  Eyes:     Pupils: Pupils are equal, round, and reactive to light.  Cardiovascular:     Rate and Rhythm: Normal rate and regular rhythm.     Heart sounds: Normal heart sounds. No murmur heard. Pulmonary:     Effort: Pulmonary effort is normal.     Breath sounds: Normal breath sounds. No wheezing.  Abdominal:     General: Bowel sounds are normal. There is no distension.     Palpations: Abdomen is soft.     Tenderness: There is no abdominal tenderness.  Musculoskeletal:        General: Normal range of motion.     Cervical back: Normal range of motion.  Skin:    General: Skin is warm and dry.     Findings: No rash.  Neurological:     Mental Status: He is alert and oriented to person, place, and time.     Gait: Gait is intact.  Psychiatric:        Mood and Affect: Mood and affect normal.        Cognition and Memory: Memory normal.        Judgment: Judgment normal.      I spent 20 minutes dedicated to the care of this patient (face-to-face and non-face-to-face) on the date of the encounter to include what is described in the assessment and plan.,  Delon Hope, NP 06/17/2024 9:44 AM

## 2024-08-16 DIAGNOSIS — H5212 Myopia, left eye: Secondary | ICD-10-CM | POA: Diagnosis not present

## 2024-08-16 DIAGNOSIS — H524 Presbyopia: Secondary | ICD-10-CM | POA: Diagnosis not present

## 2024-08-16 DIAGNOSIS — H5201 Hypermetropia, right eye: Secondary | ICD-10-CM | POA: Diagnosis not present

## 2024-08-16 DIAGNOSIS — H52223 Regular astigmatism, bilateral: Secondary | ICD-10-CM | POA: Diagnosis not present

## 2024-08-21 ENCOUNTER — Other Ambulatory Visit: Payer: Self-pay | Admitting: Family Medicine

## 2024-08-21 DIAGNOSIS — I1A0 Resistant hypertension: Secondary | ICD-10-CM

## 2024-09-07 ENCOUNTER — Ambulatory Visit: Admitting: Family Medicine

## 2024-09-07 VITALS — BP 190/72 | HR 58 | Temp 97.2°F | Ht 71.0 in | Wt 208.0 lb

## 2024-09-07 DIAGNOSIS — E782 Mixed hyperlipidemia: Secondary | ICD-10-CM | POA: Diagnosis not present

## 2024-09-07 DIAGNOSIS — R21 Rash and other nonspecific skin eruption: Secondary | ICD-10-CM | POA: Insufficient documentation

## 2024-09-07 DIAGNOSIS — I2510 Atherosclerotic heart disease of native coronary artery without angina pectoris: Secondary | ICD-10-CM | POA: Diagnosis not present

## 2024-09-07 DIAGNOSIS — I1A0 Resistant hypertension: Secondary | ICD-10-CM | POA: Diagnosis not present

## 2024-09-07 MED ORDER — GEMFIBROZIL 600 MG PO TABS: ORAL_TABLET | ORAL | 3 refills | Status: AC

## 2024-09-07 MED ORDER — KETOCONAZOLE 2 % EX SHAM: 1.0000 | MEDICATED_SHAMPOO | CUTANEOUS | 0 refills | Status: AC

## 2024-09-07 MED ORDER — CLOBETASOL PROPIONATE 0.05 % EX OINT: 1.0000 | TOPICAL_OINTMENT | Freq: Two times a day (BID) | CUTANEOUS | 0 refills | Status: AC

## 2024-09-07 NOTE — Assessment & Plan Note (Signed)
?   Fungal vs contact/allergic.  Treating with Ketoconazole  and Clobetasol .

## 2024-09-07 NOTE — Assessment & Plan Note (Signed)
 Stable

## 2024-09-07 NOTE — Patient Instructions (Signed)
 Labs today.  Medications as prescribed.  Monitor BP closely.  Follow up in 6 months.

## 2024-09-07 NOTE — Assessment & Plan Note (Signed)
 Normally well controlled. Advised to monitor closely at home. Continue current medications.

## 2024-09-07 NOTE — Assessment & Plan Note (Addendum)
 Well controlled. Lipid panel today. Continue Crestor  and Gemfibrozil .

## 2024-09-07 NOTE — Progress Notes (Signed)
 Subjective:  Patient ID: Hector [name], male    DOB: [birth year]  Age: 83 y.o. MRN: [id number]  CC:   Chief Complaint  Patient presents with   Hypertension    HPI:  83 year old male presents for follow up.   BP normally well controlled. Unclear why it is elevated today. He is feeling well. No chest pain or SOB.  Reports rash to the back of the neck for the past 2 months. Itches. Is in the hairline as well. No known inciting or relieving factors.  Patient Active Problem List   Diagnosis Date Noted   Rash 09/07/2024   BPH (benign prostatic hyperplasia) 12/07/2023   CAD (coronary artery disease) 06/09/2023   Aortic atherosclerosis 06/09/2023   Resistant hypertension 01/12/2023   Hemochromatosis 12/10/2022   Anemia 10/24/2020   S/P right TKA 02/15/2019   Diverticulosis of colon without hemorrhage    Hyperlipemia, mixed 01/18/2013   Sensory neuropathy 01/18/2013    Social Hx   Social History   Socioeconomic History   Marital status: Married    Spouse name: Merlynn   Number of children: Not on file   Years of education: Not on file   Highest education level: Associate degree: occupational, scientist, product/process development, or vocational program  Occupational History   Not on file  Tobacco Use   Smoking status: Former    Current packs/day: 0.00    Types: Pipe, Cigarettes    Quit date: 09/22/1961    Years since quitting: 63.0   Smokeless tobacco: Never   Tobacco comments:    quit 40-50 years ago  Vaping Use   Vaping status: Never Used  Substance and Sexual Activity   Alcohol use: No    Comment: QUIT SMOKING AGE 31   Drug use: No   Sexual activity: Not Currently  Other Topics Concern   Not on file  Social History Narrative   Married x 8 years to [place]. Dated in HS, married separate people and both divorced re-met after 52 years, married in 2015.   Social Drivers of Health   Tobacco Use: Medium Risk (06/01/2024)   Patient History    Smoking Tobacco Use: Former    Smokeless Tobacco  Use: Never    Passive Exposure: Not on file  Financial Resource Strain: Low Risk (09/03/2024)   Overall Financial Resource Strain (CARDIA)    Difficulty of Paying Living Expenses: Not very hard  Food Insecurity: No Food Insecurity (09/03/2024)   Epic    Worried About Programme Researcher, Broadcasting/film/video in the Last Year: Never true    Ran Out of Food in the Last Year: Never true  Transportation Needs: No Transportation Needs (09/03/2024)   Epic    Lack of Transportation (Medical): No    Lack of Transportation (Non-Medical): No  Physical Activity: Insufficiently Active (09/03/2024)   Exercise Vital Sign    Days of Exercise per Week: 3 days    Minutes of Exercise per Session: 20 min  Stress: No Stress Concern Present (09/03/2024)   [employer] of Occupational Health - Occupational Stress Questionnaire    Feeling of Stress: Only a little  Social Connections: Socially Integrated (09/03/2024)   Social Connection and Isolation Panel    Frequency of Communication with Friends and Family: Three times a week    Frequency of Social Gatherings with Friends and Family: Once a week    Attends Religious Services: More than 4 times per year    Active Member of Clubs or Organizations: Yes  Attends Banker Meetings: More than 4 times per year    Marital Status: Married  Depression (PHQ2-9): Low Risk (09/07/2024)   Depression (PHQ2-9)    PHQ-2 Score: 0  Alcohol Screen: Low Risk (04/22/2024)   Alcohol Screen    Last Alcohol Screening Score (AUDIT): 0  Housing: Low Risk (09/03/2024)   Epic    Unable to Pay for Housing in the Last Year: No    Number of Times Moved in the Last Year: 0    Homeless in the Last Year: No  Utilities: Not At Risk (04/22/2024)   Epic    Threatened with loss of utilities: No  Health Literacy: Adequate Health Literacy (04/22/2024)   B1300 Health Literacy    Frequency of need for help with medical instructions: Never    Review of Systems Per HPI  Objective:  BP (!)  190/72   Pulse (!) 58   Temp (!) 97.2 F (36.2 C)   Ht 5' 11 (1.803 m)   Wt 208 lb (94.3 kg)   SpO2 98%   BMI 29.01 kg/m      09/07/2024    8:39 AM 09/07/2024    8:23 AM 06/17/2024   10:03 AM  BP/Weight  Systolic BP 190 198 137  Diastolic BP 72 69 77  Wt. (Lbs)  208 205.4  BMI  29.01 kg/m2 28.65 kg/m2    Physical Exam Vitals and nursing note reviewed.  Constitutional:      General: He is not in acute distress.    Appearance: Normal appearance.  HENT:     Head: Normocephalic and atraumatic.      Comments: Erythematous and slightly dry rash at the labeled location. Eyes:     General:        Right eye: No discharge.        Left eye: No discharge.     Conjunctiva/sclera: Conjunctivae normal.  Cardiovascular:     Rate and Rhythm: Regular rhythm. Bradycardia present.  Pulmonary:     Effort: Pulmonary effort is normal.     Breath sounds: Normal breath sounds. No wheezing or rales.  Neurological:     Mental Status: He is alert.     Lab Results  Component Value Date   WBC 7.6 06/10/2024   HGB 11.2 (L) 06/10/2024   HCT 33.2 (L) 06/10/2024   PLT 274 06/10/2024   GLUCOSE 98 03/13/2023   CHOL 132 04/03/2023   TRIG 95 04/03/2023   HDL 30 (L) 04/03/2023   LDLCALC 83 04/03/2023   ALT 27 11/28/2022   AST 26 11/28/2022   NA 139 03/13/2023   K 4.6 03/13/2023   CL 105 03/13/2023   CREATININE 0.98 03/13/2023   BUN 16 03/13/2023   CO2 21 03/13/2023   TSH 2.590 10/26/2020   PSA 3.34 04/18/2014   INR 1.10 07/18/2014   HGBA1C 6.0 (H) 05/22/2020     Assessment & Plan:  Resistant hypertension Assessment & Plan: Normally well controlled. Advised to monitor closely at home. Continue current medications.  Orders: -     CMP14+EGFR  Hyperlipemia, mixed Assessment & Plan: Well controlled. Lipid panel today. Continue Crestor  and Gemfibrozil .  Orders: -     Lipid panel -     Gemfibrozil ; TAKE 1 TABLET TWICE DAILY BEFORE MEALS  Dispense: 180 tablet; Refill:  3  Rash Assessment & Plan: ? Fungal vs contact/allergic.  Treating with Ketoconazole  and Clobetasol .   Orders: -     Ketoconazole ; Apply 1 Application topically 2 (two)  times a week.  Dispense: 120 mL; Refill: 0 -     Clobetasol  Propionate; Apply 1 Application topically 2 (two) times daily.  Dispense: 30 g; Refill: 0  Coronary artery disease involving native coronary artery of native heart without angina pectoris Assessment & Plan: Stable.     Follow-up:  6 months  Ellar Hakala Bluford DO [facility]

## 2024-09-08 LAB — CMP14+EGFR
ALT: 12 IU/L (ref 0–44)
AST: 20 IU/L (ref 0–40)
Albumin: 4.3 g/dL (ref 3.7–4.7)
Alkaline Phosphatase: 67 IU/L (ref 48–129)
BUN/Creatinine Ratio: 12 (ref 10–24)
BUN: 15 mg/dL (ref 8–27)
Bilirubin Total: 0.4 mg/dL (ref 0.0–1.2)
CO2: 21 mmol/L (ref 20–29)
Calcium: 9.3 mg/dL (ref 8.6–10.2)
Chloride: 106 mmol/L (ref 96–106)
Creatinine, Ser: 1.22 mg/dL (ref 0.76–1.27)
Globulin, Total: 2.6 g/dL (ref 1.5–4.5)
Glucose: 102 mg/dL — ABNORMAL HIGH (ref 70–99)
Potassium: 4.5 mmol/L (ref 3.5–5.2)
Sodium: 143 mmol/L (ref 134–144)
Total Protein: 6.9 g/dL (ref 6.0–8.5)
eGFR: 60 mL/min/1.73 (ref >59)

## 2024-09-08 LAB — LIPID PANEL
Chol/HDL Ratio: 3 ratio (ref 0.0–5.0)
Cholesterol, Total: 97 mg/dL — ABNORMAL LOW (ref 100–199)
HDL: 32 mg/dL — ABNORMAL LOW (ref >39)
LDL Chol Calc (NIH): 50 mg/dL (ref 0–99)
Triglycerides: 66 mg/dL (ref 0–149)
VLDL Cholesterol Cal: 15 mg/dL (ref 5–40)

## 2024-12-09 ENCOUNTER — Other Ambulatory Visit

## 2024-12-16 ENCOUNTER — Inpatient Hospital Stay: Admitting: Oncology

## 2024-12-16 ENCOUNTER — Ambulatory Visit: Admitting: Oncology

## 2025-03-08 ENCOUNTER — Ambulatory Visit: Admitting: Family Medicine

## 2025-04-28 ENCOUNTER — Ambulatory Visit
